# Patient Record
Sex: Female | Born: 1965
Health system: Southern US, Community
[De-identification: ages and names within clinical notes are randomized; demographics above are authoritative.]

## PROBLEM LIST (undated history)

## (undated) DIAGNOSIS — T7840XA Allergy, unspecified, initial encounter: Secondary | ICD-10-CM

## (undated) DIAGNOSIS — R112 Nausea with vomiting, unspecified: Secondary | ICD-10-CM

## (undated) DIAGNOSIS — H9193 Unspecified hearing loss, bilateral: Secondary | ICD-10-CM

## (undated) DIAGNOSIS — I10 Essential (primary) hypertension: Secondary | ICD-10-CM

## (undated) DIAGNOSIS — F32A Depression, unspecified: Secondary | ICD-10-CM

## (undated) DIAGNOSIS — T8859XA Other complications of anesthesia, initial encounter: Secondary | ICD-10-CM

## (undated) DIAGNOSIS — E559 Vitamin D deficiency, unspecified: Secondary | ICD-10-CM

## (undated) DIAGNOSIS — J45909 Unspecified asthma, uncomplicated: Secondary | ICD-10-CM

## (undated) DIAGNOSIS — K219 Gastro-esophageal reflux disease without esophagitis: Secondary | ICD-10-CM

## (undated) DIAGNOSIS — E785 Hyperlipidemia, unspecified: Secondary | ICD-10-CM

## (undated) HISTORY — DX: Gastro-esophageal reflux disease without esophagitis: K21.9

## (undated) HISTORY — DX: Unspecified asthma, uncomplicated: J45.909

## (undated) HISTORY — DX: Allergy, unspecified, initial encounter: T78.40XA

## (undated) HISTORY — DX: Vitamin D deficiency, unspecified: E55.9

## (undated) HISTORY — DX: Unspecified hearing loss, bilateral: H91.93

## (undated) HISTORY — DX: Depression, unspecified: F32.A

## (undated) HISTORY — DX: Essential (primary) hypertension: I10

## (undated) HISTORY — DX: Hyperlipidemia, unspecified: E78.5

---
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
## 1986-02-21 HISTORY — PX: FRACTURE SURGERY: SHX138

## 1989-02-21 HISTORY — PX: NASAL SEPTUM SURGERY: SHX37

## 1990-02-21 HISTORY — PX: OTHER SURGICAL HISTORY: SHX169

## 2003-02-22 DIAGNOSIS — K219 Gastro-esophageal reflux disease without esophagitis: Secondary | ICD-10-CM

## 2003-02-22 HISTORY — DX: Gastro-esophageal reflux disease without esophagitis: K21.9

## 2006-02-21 HISTORY — PX: OTHER SURGICAL HISTORY: SHX169

## 2009-02-21 HISTORY — PX: SPINE SURGERY: SHX786

## 2010-02-21 DIAGNOSIS — I10 Essential (primary) hypertension: Secondary | ICD-10-CM

## 2010-02-21 DIAGNOSIS — E785 Hyperlipidemia, unspecified: Secondary | ICD-10-CM

## 2010-02-21 HISTORY — DX: Essential (primary) hypertension: I10

## 2010-02-21 HISTORY — DX: Hyperlipidemia, unspecified: E78.5

## 2011-02-22 HISTORY — PX: OTHER SURGICAL HISTORY: SHX169

## 2013-02-21 HISTORY — PX: COLONOSCOPY: SHX174

## 2015-01-02 LAB — HM COLONOSCOPY

## 2016-11-08 ENCOUNTER — Ambulatory Visit (INDEPENDENT_AMBULATORY_CARE_PROVIDER_SITE_OTHER): Payer: PRIVATE HEALTH INSURANCE | Admitting: Internal Medicine

## 2016-11-08 ENCOUNTER — Encounter: Payer: Self-pay | Admitting: Internal Medicine

## 2016-11-08 VITALS — BP 124/84 | HR 96 | Temp 97.5°F | Resp 18 | Ht 62.0 in | Wt 157.2 lb

## 2016-11-08 DIAGNOSIS — Z0001 Encounter for general adult medical examination with abnormal findings: Secondary | ICD-10-CM | POA: Diagnosis not present

## 2016-11-08 DIAGNOSIS — Z23 Encounter for immunization: Secondary | ICD-10-CM

## 2016-11-08 DIAGNOSIS — K219 Gastro-esophageal reflux disease without esophagitis: Secondary | ICD-10-CM

## 2016-11-08 DIAGNOSIS — R5383 Other fatigue: Secondary | ICD-10-CM

## 2016-11-08 DIAGNOSIS — Z136 Encounter for screening for cardiovascular disorders: Secondary | ICD-10-CM

## 2016-11-08 DIAGNOSIS — I10 Essential (primary) hypertension: Secondary | ICD-10-CM

## 2016-11-08 DIAGNOSIS — Z1212 Encounter for screening for malignant neoplasm of rectum: Secondary | ICD-10-CM

## 2016-11-08 DIAGNOSIS — E559 Vitamin D deficiency, unspecified: Secondary | ICD-10-CM

## 2016-11-08 DIAGNOSIS — F329 Major depressive disorder, single episode, unspecified: Secondary | ICD-10-CM | POA: Insufficient documentation

## 2016-11-08 DIAGNOSIS — R7309 Other abnormal glucose: Secondary | ICD-10-CM

## 2016-11-08 DIAGNOSIS — F32A Depression, unspecified: Secondary | ICD-10-CM

## 2016-11-08 DIAGNOSIS — J45909 Unspecified asthma, uncomplicated: Secondary | ICD-10-CM

## 2016-11-08 DIAGNOSIS — Z111 Encounter for screening for respiratory tuberculosis: Secondary | ICD-10-CM | POA: Diagnosis not present

## 2016-11-08 DIAGNOSIS — F325 Major depressive disorder, single episode, in full remission: Secondary | ICD-10-CM | POA: Insufficient documentation

## 2016-11-08 DIAGNOSIS — Z79899 Other long term (current) drug therapy: Secondary | ICD-10-CM

## 2016-11-08 DIAGNOSIS — E782 Mixed hyperlipidemia: Secondary | ICD-10-CM | POA: Insufficient documentation

## 2016-11-08 DIAGNOSIS — Z1211 Encounter for screening for malignant neoplasm of colon: Secondary | ICD-10-CM

## 2016-11-08 NOTE — Progress Notes (Signed)
ADULT & ADOLESCENT INTERNAL MEDICINE Unk Pinto, M.D.     Uvaldo Bristle. [name], P.A.-C [name], [place] [address] Telephone [phone] Telefax [phone] Annual Screening/Preventative Visit & Comprehensive Evaluation &  Examination     This very nice 51 y.o. MWF returns to this practice after a 20+ yr sojurn and return to the [place] area from [place]. She  presents for a Screening/Preventative Visit & comprehensive evaluation and management of multiple medical co-morbidities.  Patient has been followed for HTN, Prediabetes, Hyperlipidemia and Vitamin D Deficiency. Patient has hx/o GERD controlled with her meds.       HTN predates since  2012. Patient's BP has been controlled at home and patient denies any cardiac symptoms as chest pain, palpitations, shortness of breath, dizziness or ankle swelling. Today's BP is at goal - 124/84.      Patient's hyperlipidemia is allegedly controlled with diet and medications. Patient denies myalgias or other medication SE's. Records of labs are forthcoming.        Patient is screened expectantly for prediabetes predating  and patient denies reactive hypoglycemic symptoms, visual blurring, diabetic polys, or paresthesias.     Finally, patient has history of Vitamin D Deficiency and lis on replacement therapy.   Allergies - none  Past Medical History:  Diagnosis Date  . Allergy   . Asthma   . GERD (gastroesophageal reflux disease) 2005  . 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 Hyperlipidemia 2012  . Hypertension 2012  . Vitamin D deficiency    Immunization History  Administered Date(s) Administered  . Influenza Inj Mdck Quad With Preservative 11/08/2016  . PPD Test 11/08/2016  . Pneumococcal-Unspecified 02/22/2015  . Td 02/22/2012  . Zoster 02/22/2015   Past Surgical History:  Procedure Laterality Date  . COLONOSCOPY N/A 2015   screening  at age 13 - Negative  . FRACTURE SURGERY  1988   facial Fx , nose, Ankle both Arms  . l ulnar nonunion  1992  . NASAL SEPTUM SURGERY  1991  . nonunion 5th metatarsal  2008  . r sub talar fusion  2008  . redo subtalar fusion  2013  . rt ankle surg for non union  2013  . SPINE SURGERY  2011   Cx5 - Cx6 fusion w/plate/screws  . TONSILLECTOMY AND ADENOIDECTOMY  1972   Family History  Problem Relation Age of Onset  . Diabetes Mother   . Heart disease Father   . Hypertension Brother   . Heart disease Brother   . Stroke Maternal Grandmother      Social History  Substance Use Topics  . Smoking status: Never Smoker  . Smokeless tobacco: Not on file  . Alcohol use No    ROS Constitutional: Denies fever, chills, weight loss/gain, headaches, insomnia,  night sweats, and change in appetite. Does c/o fatigue. Eyes: Denies redness, blurred vision, diplopia, discharge, itchy, watery eyes.  ENT: Denies discharge, congestion, post nasal drip, epistaxis, sore throat, earache, hearing loss, dental pain, Tinnitus, Vertigo, Sinus pain, snoring.  Cardio: Denies chest pain, palpitations, irregular heartbeat, syncope, dyspnea, diaphoresis, orthopnea, PND, claudication, edema Respiratory: denies cough, dyspnea, DOE, pleurisy, hoarseness, laryngitis, wheezing.  Gastrointestinal: Denies dysphagia, heartburn, reflux, water brash, pain, cramps, nausea, vomiting, bloating, diarrhea, constipation, hematemesis, melena, hematochezia, jaundice, hemorrhoids Genitourinary: Denies dysuria, frequency, urgency, nocturia, hesitancy, discharge, hematuria, flank pain Breast: Breast lumps, nipple discharge, bleeding.  Musculoskeletal: Denies arthralgia, myalgia, stiffness, Jt. Swelling, pain, limp,  and strain/sprain. Denies falls. Skin: Denies puritis, rash, hives, warts, acne, eczema, changing in skin lesion Neuro: No weakness, tremor, incoordination, spasms, paresthesia, pain Psychiatric: Denies confusion, memory  loss, sensory loss. Denies Depression. Endocrine: Denies change in weight, skin, hair change, nocturia, and paresthesia, diabetic polys, visual blurring, hyper / hypo glycemic episodes.  Heme/Lymph: No excessive bleeding, bruising, enlarged lymph nodes.  Physical Exam  BP 124/84   Pulse 96   Temp (!) 97.5 F (36.4 C)   Resp 18   Ht 5\' 2"  (1.575 m)   Wt 157 lb 3.2 oz (71.3 kg)   BMI 28.75 kg/m   General Appearance: Well nourished, well groomed and in no apparent distress.  Eyes: PERRLA, EOMs, conjunctiva no swelling or erythema, normal fundi and vessels. Sinuses: No frontal/maxillary tenderness ENT/Mouth: EACs patent / TMs  nl. Nares clear without erythema, swelling, mucoid exudates. Oral hygiene is good. No erythema, swelling, or exudate. Tongue normal, non-obstructing. Tonsils not swollen or erythematous. Hearing normal.  Neck: Supple, thyroid normal. No bruits, nodes or JVD. Respiratory: Respiratory effort normal.  BS equal and clear bilateral without rales, rhonci, wheezing or stridor. Cardio: Heart sounds are normal with regular rate and rhythm and no murmurs, rubs or gallops. Peripheral pulses are normal and equal bilaterally without edema. No aortic or femoral bruits. Chest: symmetric with normal excursions and percussion. Breasts: Deferred to GYN.  Abdomen: Flat, soft with bowel sounds active. Nontender, no guarding, rebound, hernias, masses, or organomegaly.  Lymphatics: Non tender without lymphadenopathy.  Genitourinary: Deferred to GYN. Musculoskeletal: Full ROM all peripheral extremities, joint stability, 5/5 strength, and normal gait. Skin: Warm and dry without rashes, lesions, cyanosis, clubbing or  ecchymosis.  Neuro: Cranial nerves intact, reflexes equal bilaterally. Normal muscle tone, no cerebellar symptoms. Sensation intact.  Pysch: Alert and oriented X 3, normal affect, Insight and Judgment appropriate.   Assessment and Plan  1. Annual Preventative Screening  Examination  2. Essential hypertension  - lisinopril (PRINIVIL,ZESTRIL) 20 MG tablet; TK 1 T PO ONCE D; Refill: 1  - EKG 12-Lead - [place], RETROPERITNL ABD,  LTD - Urinalysis, Routine w reflex microscopic - Microalbumin / creatinine urine ratio - CBC with Differential/Platelet - BASIC METABOLIC PANEL WITH GFR - Magnesium - TSH  3. Hyperlipidemia, mixed  - atorvastatin  20 MG tablet; Take 20 mg by mouth at bedtime.; Refill: 1 - Omega-3 Fatty Acids (OMEGA-3 CF PO); Take 1 capsule by mouth daily.  - EKG 12-Lead  - [place], RETROPERITNL ABD,  LTD  - Hepatic function panel - Lipid panel  4. Abnormal glucose  - Hemoglobin A1c - Insulin, fasting  5. Vitamin D deficiency  - Vitamin D, Ergocalciferol, (DRISDOL) 50000 units CAPS capsule; TK ONE C PO ONCE A WEEK; Refill: 1 - VITAMIN D 25 Hydroxy   6. Gastroesophageal reflux disease  - pantoprazole  40 MG tablet; TK 1 T PO D PRN; Refill: 1  7. Asthma due to environmental allergies  - PROAIR HFA 108 (90 Base) MCG/ACT inhaler; INHALE 1 PUFF PO Q 4 H; Refill: 1 - benzonatate (TESSALON) 100 MG capsule; TK ONE C PO TID PRN; Refill: 1 - XYZAL ALLERGY 24HR 5 MG tablet; TK 1 T PO ONCE A DAY IN THE EVE; Refill: 1 - DULERA 100-5 MCG/ACT AERO; INL 2 PFS PO BID; Refill: 1 - montelukast (SINGULAIR) 10 MG tablet; TK 1 T PO ONCE A DAY; Refill: 1  8. Depression, controlled  - ALPRAZolam (XANAX) 0.5 MG tablet; ; Refill: 1 - buPROPion (WELLBUTRIN XL)  150 MG 24 hr tablet; TK 1 T PO QAM; Refill: 1 - citalopram (CELEXA) 20 MG tablet; TK 1 T PO ONCE A DAY; Refill: 1 - clonazePAM (KLONOPIN) 1 MG tablet; TK 1 T PO ONCE D; Refill: 0  9. Screening for colorectal cancer  - POC Hemoccult Bld/Stl   10. Screening for AAA (aortic abdominal aneurysm)  - [place], RETROPERITNL ABD,  LTD  11. Screening for ischemic heart disease  - EKG 12-Lead  12. Screening examination for pulmonary tuberculosis  - PPD  13. Need for prophylactic vaccination and  inoculation against influenza  - FLU VACCINE MDCK QUAD W/Preservative  14. Fatigue  - Vitamin B12 - CBC with Differential/Platelet - Iron,Total/Total Iron Binding Cap  15. Medication management  - Urinalysis, Routine w reflex microscopic - Microalbumin / creatinine urine ratio - CBC with Differential/Platelet - BASIC METABOLIC PANEL WITH GFR - Hepatic function panel - Magnesium - Lipid panel - TSH - Hemoglobin A1c - Insulin, fasting - VITAMIN D 25 Hydroxy        Patient was counseled in prudent diet to achieve/maintain BMI less than 25 for weight control, BP monitoring, regular exercise and medications. Discussed med's effects and SE's. Screening labs and tests as requested with regular follow-up as recommended. Over 55 minutes of exam, counseling, chart review and high complex critical decision making was performed.

## 2016-11-08 NOTE — Patient Instructions (Signed)

## 2016-11-09 LAB — CBC WITH DIFFERENTIAL/PLATELET
BASOS PCT: 0.6 %
Basophils Absolute: 32 cells/uL (ref 0–200)
EOS ABS: 32 {cells}/uL (ref 15–500)
Eosinophils Relative: 0.6 %
HCT: 36.6 % (ref 35.0–45.0)
Hemoglobin: 12.5 g/dL (ref 11.7–15.5)
Lymphs Abs: 970 cells/uL (ref 850–3900)
MCH: 30.9 pg (ref 27.0–33.0)
MCHC: 34.2 g/dL (ref 32.0–36.0)
MCV: 90.6 fL (ref 80.0–100.0)
MPV: 11 fL (ref 7.5–12.5)
Monocytes Relative: 6.6 %
NEUTROS PCT: 73.9 %
Neutro Abs: 3917 cells/uL (ref 1500–7800)
PLATELETS: 207 10*3/uL (ref 140–400)
RBC: 4.04 10*6/uL (ref 3.80–5.10)
RDW: 11.9 % (ref 11.0–15.0)
TOTAL LYMPHOCYTE: 18.3 %
WBC: 5.3 10*3/uL (ref 3.8–10.8)
WBCMIX: 350 {cells}/uL (ref 200–950)

## 2016-11-09 LAB — IRON, TOTAL/TOTAL IRON BINDING CAP
%SAT: 9 % (calc) — ABNORMAL LOW (ref 11–50)
IRON: 27 ug/dL — AB (ref 45–160)
TIBC: 286 mcg/dL (calc) (ref 250–450)

## 2016-11-09 LAB — URINALYSIS, ROUTINE W REFLEX MICROSCOPIC
Bilirubin Urine: NEGATIVE
Glucose, UA: NEGATIVE
HGB URINE DIPSTICK: NEGATIVE
Hyaline Cast: NONE SEEN /LPF
Ketones, ur: NEGATIVE
Nitrite: NEGATIVE
Protein, ur: NEGATIVE
RBC / HPF: NONE SEEN /HPF (ref 0–2)
SQUAMOUS EPITHELIAL / LPF: NONE SEEN /HPF (ref <=5)
Specific Gravity, Urine: 1.007 (ref 1.001–1.03)
pH: 6 (ref 5.0–8.0)

## 2016-11-09 LAB — LIPID PANEL
CHOL/HDL RATIO: 2.7 (calc) (ref <5.0)
CHOLESTEROL: 125 mg/dL (ref <200)
HDL: 47 mg/dL — AB (ref >50)
LDL CHOLESTEROL (CALC): 56 mg/dL
Non-HDL Cholesterol (Calc): 78 mg/dL (calc) (ref <130)
TRIGLYCERIDES: 136 mg/dL (ref <150)

## 2016-11-09 LAB — HEMOGLOBIN A1C
HEMOGLOBIN A1C: 4.8 %{Hb} (ref <5.7)
MEAN PLASMA GLUCOSE: 91 (calc)
eAG (mmol/L): 5 (calc)

## 2016-11-09 LAB — MICROALBUMIN / CREATININE URINE RATIO
CREATININE, URINE: 54 mg/dL (ref 20–275)
MICROALB UR: 0.3 mg/dL
Microalb Creat Ratio: 6 mcg/mg creat (ref <30)

## 2016-11-09 LAB — HEPATIC FUNCTION PANEL
AG RATIO: 1.9 (calc) (ref 1.0–2.5)
ALBUMIN MSPROF: 4.4 g/dL (ref 3.6–5.1)
ALT: 13 U/L (ref 6–29)
AST: 15 U/L (ref 10–35)
Alkaline phosphatase (APISO): 115 U/L (ref 33–130)
BILIRUBIN INDIRECT: 0.2 mg/dL (ref 0.2–1.2)
Bilirubin, Direct: 0.1 mg/dL (ref 0.0–0.2)
GLOBULIN: 2.3 g/dL (ref 1.9–3.7)
TOTAL PROTEIN: 6.7 g/dL (ref 6.1–8.1)
Total Bilirubin: 0.3 mg/dL (ref 0.2–1.2)

## 2016-11-09 LAB — INSULIN, FASTING: Insulin: 4 u[IU]/mL (ref 2.0–19.6)

## 2016-11-09 LAB — BASIC METABOLIC PANEL WITH GFR
BUN: 13 mg/dL (ref 7–25)
CALCIUM: 8.8 mg/dL (ref 8.6–10.4)
CHLORIDE: 101 mmol/L (ref 98–110)
CO2: 27 mmol/L (ref 20–32)
CREATININE: 0.92 mg/dL (ref 0.50–1.05)
GFR, Est African American: 82 mL/min/{1.73_m2} (ref >=60)
GFR, Est Non African American: 71 mL/min/{1.73_m2} (ref >=60)
GLUCOSE: 82 mg/dL (ref 65–99)
Potassium: 3.6 mmol/L (ref 3.5–5.3)
Sodium: 139 mmol/L (ref 135–146)

## 2016-11-09 LAB — VITAMIN B12: Vitamin B-12: 292 pg/mL (ref 200–1100)

## 2016-11-09 LAB — MAGNESIUM: MAGNESIUM: 1.9 mg/dL (ref 1.5–2.5)

## 2016-11-09 LAB — TSH: TSH: 2.23 mIU/L

## 2016-11-09 LAB — VITAMIN D 25 HYDROXY (VIT D DEFICIENCY, FRACTURES): Vit D, 25-Hydroxy: 55 ng/mL (ref 30–100)

## 2016-11-10 LAB — TB SKIN TEST
INDURATION: 0 mm
TB Skin Test: NEGATIVE

## 2016-11-14 ENCOUNTER — Other Ambulatory Visit: Payer: Self-pay | Admitting: *Deleted

## 2016-11-14 ENCOUNTER — Other Ambulatory Visit: Payer: Self-pay | Admitting: Internal Medicine

## 2016-11-14 DIAGNOSIS — F329 Major depressive disorder, single episode, unspecified: Secondary | ICD-10-CM

## 2016-11-14 DIAGNOSIS — I10 Essential (primary) hypertension: Secondary | ICD-10-CM

## 2016-11-14 DIAGNOSIS — F32A Depression, unspecified: Secondary | ICD-10-CM

## 2016-11-14 DIAGNOSIS — J45909 Unspecified asthma, uncomplicated: Secondary | ICD-10-CM

## 2016-11-14 MED ORDER — PROAIR HFA 108 (90 BASE) MCG/ACT IN AERS: INHALATION_SPRAY | RESPIRATORY_TRACT | 1 refills | Status: DC

## 2016-11-14 MED ORDER — LISINOPRIL 20 MG PO TABS: ORAL_TABLET | ORAL | 0 refills | Status: DC

## 2016-11-15 ENCOUNTER — Other Ambulatory Visit: Payer: Self-pay | Admitting: *Deleted

## 2016-11-15 DIAGNOSIS — Z79899 Other long term (current) drug therapy: Secondary | ICD-10-CM

## 2016-11-15 DIAGNOSIS — F32A Depression, unspecified: Secondary | ICD-10-CM

## 2016-11-15 DIAGNOSIS — F329 Major depressive disorder, single episode, unspecified: Secondary | ICD-10-CM

## 2016-11-15 MED ORDER — ALPRAZOLAM 0.5 MG PO TABS: ORAL_TABLET | ORAL | 2 refills | Status: DC

## 2016-11-15 MED ORDER — CYCLOBENZAPRINE HCL 10 MG PO TABS: ORAL_TABLET | ORAL | 0 refills | Status: DC

## 2016-12-11 ENCOUNTER — Other Ambulatory Visit: Payer: Self-pay | Admitting: Internal Medicine

## 2017-01-08 ENCOUNTER — Other Ambulatory Visit: Payer: Self-pay | Admitting: Physician Assistant

## 2017-01-25 ENCOUNTER — Other Ambulatory Visit: Payer: Self-pay | Admitting: Internal Medicine

## 2017-01-25 DIAGNOSIS — F32A Depression, unspecified: Secondary | ICD-10-CM

## 2017-01-25 DIAGNOSIS — E782 Mixed hyperlipidemia: Secondary | ICD-10-CM

## 2017-01-25 DIAGNOSIS — F329 Major depressive disorder, single episode, unspecified: Secondary | ICD-10-CM

## 2017-01-25 DIAGNOSIS — I10 Essential (primary) hypertension: Secondary | ICD-10-CM

## 2017-01-25 DIAGNOSIS — K219 Gastro-esophageal reflux disease without esophagitis: Secondary | ICD-10-CM

## 2017-01-25 DIAGNOSIS — Z79899 Other long term (current) drug therapy: Secondary | ICD-10-CM

## 2017-02-13 ENCOUNTER — Other Ambulatory Visit: Payer: Self-pay | Admitting: Internal Medicine

## 2017-02-13 DIAGNOSIS — E669 Obesity, unspecified: Secondary | ICD-10-CM | POA: Insufficient documentation

## 2017-02-13 DIAGNOSIS — E663 Overweight: Secondary | ICD-10-CM | POA: Insufficient documentation

## 2017-02-13 NOTE — Progress Notes (Signed)
FOLLOW UP  Assessment and Plan:   Hypertension Well controlled with current medications  Monitor blood pressure at home; patient to call if consistently greater than 130/80 Continue DASH diet.   Reminder to go to the ER if any CP, SOB, nausea, dizziness, severe HA, changes vision/speech, left arm numbness and tingling and jaw pain.  Cholesterol At goal on statin; continue medication  Continue low cholesterol diet and exercise.  Check lipid panel.   Other abnormal glucose Discussed risks associated with elevated glucose Recommend prudent diet, portion control, maintenance of weight in normal range, regular aerobic exercise Defer A1C; check annually - monitor sugars by BMP  GERD Symptoms well managed without breakthrough Will try to get off PPI given info for taper and zantac sent in Discussed diet, avoiding triggers and other lifestyle changes  Overweight Long discussion about weight loss, diet, and exercise Recommended diet heavy in fruits and veggies and low in animal meats, cheeses, and dairy products, appropriate calorie intake Discussed appropriate weight for height Follow up in 3 months  Depression/anxiety Continuing to need xanax nearly every day; will try increasing wellbutrin  Lifestyle discussed: diet/exerise, sleep hygiene, stress management, hydration  Vitamin D Def/ osteoporosis prevention Continue supplementation Check Vit D level  Continue diet and meds as discussed. Further disposition pending results of labs. Discussed med's effects and SE's.   Over 30 minutes of exam, counseling, chart review, and critical decision making was performed.   Future Appointments  Date Time Provider [place]  05/18/2017  9:30 AM Unk Pinto, MD [facility] None    ----------------------------------------------------------------------------------------------------------------------  HPI 51 y.o. female  presents for 3 month follow up on hypertension,  cholesterol, GERD, blood sugar monitoring, weight, depression and vitamin D deficiency.   BMI is Body mass index is 29.81 kg/m., she has been working on diet and exercise. Wt Readings from Last 3 Encounters:  02/15/17 163 lb (73.9 kg)  11/08/16 157 lb 3.2 oz (71.3 kg)   she has ongoing depression with anxious features and is currently on celexa 20 mg daily, wellbutrin XR 150 mg daily, xanax 0.5 mg PRN (currently taking most every day), reports symptoms are not well controlled on current regimen - still needing xanax daily.    she has a diagnosis of GERD which is currently managed by protonix 40 mg daily.  she reports symptoms is currently well controlled, and denies breakthrough reflux, burning in chest, hoarseness or cough.    Her blood pressure has been controlled at home, today their BP is BP: 110/70  She does workout. She denies chest pain, shortness of breath, dizziness.   She is on cholesterol medication and denies myalgias. Her cholesterol is at goal. The cholesterol last visit was:   Lab Results  Component Value Date   CHOL 125 11/08/2016   HDL 47 (L) 11/08/2016   TRIG 136 11/08/2016   CHOLHDL 2.7 11/08/2016    She has been working on diet and exercise for blood sugar control, and denies increased appetite, nausea, paresthesia of the feet, polydipsia, polyuria, visual disturbances and vomiting. Last A1C in the office was:  Lab Results  Component Value Date   HGBA1C 4.8 11/08/2016   Patient is on Vitamin D supplement but remained below goal at the last check:   Lab Results  Component Value Date   VD25OH 55 11/08/2016      Current Medications:  Current Outpatient Medications on File Prior to Visit  Medication Sig  . ALPRAZolam (XANAX) 0.5 MG tablet Take 1/2 to 1  tablet / daily if needed for anxiety and please try to limit to 5 days /week to avoid addiction  . atorvastatin (LIPITOR) 20 MG tablet TAKE 1 TABLET BY MOUTH ONCE A DAY  . benzonatate (TESSALON) 100 MG capsule TK  ONE C PO TID PRN  . buPROPion (WELLBUTRIN XL) 150 MG 24 hr tablet TAKE 1 TABLET BY MOUTH EVERY MORNING  . calcium citrate (CALCITRATE - DOSED IN MG ELEMENTAL CALCIUM) 950 MG tablet Take 200 mg of elemental calcium by mouth daily.  . citalopram (CELEXA) 20 MG tablet TAKE ONE TABLET BY MOUTH DAILY.  Marland Kitchen CRANBERRY EXTRACT PO Take 1 capsule by mouth daily.  . cyclobenzaprine (FLEXERIL) 10 MG tablet TAKE 1/2 TO 1 TABLET BY MOUTH THREE TIMES DAILY AS NEEDED  . DULERA 100-5 MCG/ACT AERO INL 2 PFS PO BID  . ibuprofen (ADVIL,MOTRIN) 600 MG tablet Take 600 mg by mouth every 6 (six) hours as needed.  Marland Kitchen lisinopril (PRINIVIL,ZESTRIL) 20 MG tablet TAKE 1 TABLET BY MOUTH EVERY DAY  . montelukast (SINGULAIR) 10 MG tablet TK 1 T PO ONCE A DAY  . Omega-3 Fatty Acids (OMEGA-3 CF PO) Take 1 capsule by mouth daily.  . pantoprazole (PROTONIX) 40 MG tablet TAKE 1 TABLET BY MOUTH TWICE DAILY  . Prenatal Vit-Fe Fumarate-FA (M-VIT PO) Take 1 tablet by mouth daily.  Marland Kitchen PROAIR HFA 108 (90 Base) MCG/ACT inhaler INHALE 1 PUFF PO Q 4 H  . vitamin B-12 (CYANOCOBALAMIN) 100 MCG tablet Take 100 mcg by mouth 2 (two) times daily.  . Vitamin D, Ergocalciferol, (DRISDOL) 50000 units CAPS capsule TK ONE C PO ONCE A WEEK  . XYZAL ALLERGY 24HR 5 MG tablet TK 1 T PO ONCE A DAY IN THE EVE   No current facility-administered medications on file prior to visit.      Allergies: No Known Allergies   Medical History:  Past Medical History:  Diagnosis Date  . Allergy   . Asthma   . GERD (gastroesophageal reflux disease) 2005  . 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 Hyperlipidemia 2012  . Hypertension 2012  . Vitamin D deficiency    Family history- Reviewed and unchanged Social history- Reviewed and unchanged   Review of Systems:  Review of Systems  Constitutional: Negative for malaise/fatigue and weight loss.  HENT: Negative for hearing loss and tinnitus.   Eyes: Negative for blurred vision and double vision.   Respiratory: Negative for cough, shortness of breath and wheezing.   Cardiovascular: Negative for chest pain, palpitations, orthopnea, claudication and leg swelling.  Gastrointestinal: Negative for abdominal pain, blood in stool, constipation, diarrhea, heartburn, melena, nausea and vomiting.  Genitourinary: Negative.   Musculoskeletal: Negative for joint pain and myalgias.  Skin: Negative for rash.  Neurological: Negative for dizziness, tingling, sensory change, weakness and headaches.  Endo/Heme/Allergies: Negative for polydipsia.  Psychiatric/Behavioral: Negative for depression and substance abuse. The patient is nervous/anxious.   All other systems reviewed and are negative.     Physical Exam: BP 110/70   Pulse (!) 107   Temp 97.6 F (36.4 C)   Wt 163 lb (73.9 kg)   SpO2 98%   BMI 29.81 kg/m  Wt Readings from Last 3 Encounters:  02/15/17 163 lb (73.9 kg)  11/08/16 157 lb 3.2 oz (71.3 kg)   General Appearance: Well nourished, in no apparent distress. Eyes: PERRLA, EOMs, conjunctiva no swelling or erythema Sinuses: No Frontal/maxillary tenderness ENT/Mouth: Ext aud canals clear, TMs without erythema, bulging. No erythema, swelling, or  exudate on post pharynx.  Tonsils not swollen or erythematous. Hearing normal.  Neck: Supple, thyroid normal.  Respiratory: Respiratory effort normal, BS equal bilaterally without rales, rhonchi, wheezing or stridor.  Cardio: RRR with no MRGs. Brisk peripheral pulses without edema.  Abdomen: Soft, + BS.  Non tender, no guarding, rebound, hernias, masses. Lymphatics: Non tender without lymphadenopathy.  Musculoskeletal: Full ROM, 5/5 strength, Normal gait Skin: Warm, dry without rashes, lesions, ecchymosis.  Neuro: Cranial nerves intact. No cerebellar symptoms.  Psych: Awake and oriented X 3, normal affect, Insight and Judgment appropriate.    Erin Ribas, NP 9:02 AM [facility]

## 2017-02-15 ENCOUNTER — Ambulatory Visit: Payer: PRIVATE HEALTH INSURANCE | Admitting: Adult Health

## 2017-02-15 ENCOUNTER — Encounter: Payer: Self-pay | Admitting: Adult Health

## 2017-02-15 VITALS — BP 110/70 | HR 107 | Temp 97.6°F | Wt 163.0 lb

## 2017-02-15 DIAGNOSIS — I10 Essential (primary) hypertension: Secondary | ICD-10-CM | POA: Diagnosis not present

## 2017-02-15 DIAGNOSIS — E663 Overweight: Secondary | ICD-10-CM

## 2017-02-15 DIAGNOSIS — R7309 Other abnormal glucose: Secondary | ICD-10-CM | POA: Diagnosis not present

## 2017-02-15 DIAGNOSIS — F329 Major depressive disorder, single episode, unspecified: Secondary | ICD-10-CM | POA: Diagnosis not present

## 2017-02-15 DIAGNOSIS — E782 Mixed hyperlipidemia: Secondary | ICD-10-CM | POA: Diagnosis not present

## 2017-02-15 DIAGNOSIS — K219 Gastro-esophageal reflux disease without esophagitis: Secondary | ICD-10-CM

## 2017-02-15 DIAGNOSIS — E559 Vitamin D deficiency, unspecified: Secondary | ICD-10-CM

## 2017-02-15 DIAGNOSIS — F32A Depression, unspecified: Secondary | ICD-10-CM

## 2017-02-15 DIAGNOSIS — Z79899 Other long term (current) drug therapy: Secondary | ICD-10-CM | POA: Diagnosis not present

## 2017-02-15 MED ORDER — BUPROPION HCL ER (XL) 300 MG PO TB24: 300.0000 mg | ORAL_TABLET | Freq: Every morning | ORAL | 1 refills | Status: DC

## 2017-02-15 MED ORDER — RANITIDINE HCL 150 MG PO TABS: 150.0000 mg | ORAL_TABLET | Freq: Two times a day (BID) | ORAL | 1 refills | Status: DC

## 2017-02-15 MED ORDER — CYCLOBENZAPRINE HCL 10 MG PO TABS: 5.0000 mg | ORAL_TABLET | Freq: Three times a day (TID) | ORAL | 1 refills | Status: DC | PRN

## 2017-02-15 NOTE — Patient Instructions (Signed)
We are trying a higher dose of wellbutrin - double up on 150 mg tabs until you run out - I have sent in new prescription with 300 mg tabs   GETTING OFF OF PPI's    Nexium/protonix/prilosec/Omeprazole/Dexilant/Aciphex are called PPI's, they are great at healing your stomach but should only be taken for a short period of time.     Recent studies have shown that taken for a long time they  can increase the risk of osteoporosis (weakening of your bones), pneumonia, low magnesium, restless legs, Cdiff (infection that causes diarrhea), DEMENTIA and most recently kidney damage / disease / insufficiency.     Due to this information we want to try to stop the PPI but if you try to stop it abruptly this can cause rebound acid and worsening symptoms.   So this is how we want you to get off the PPI: Generic is always fine!!  - Start taking the nexium/protonix/prilosec/PPI  every other day with  zantac (ranitidine) OR pepcid famotadine 2 x a day for 2-4 weeks - some people stay on this dosage and can not taper off further. Our main goal is to limit the dosage and amount you are taking so if you need to stay on this dose.   - then decrease the PPI to every 3 days while taking the zantac or pepcid 300mg  twice a day the other  days for 2-4  Weeks  - then you can try the zantac or pepcid 300mg  once at night or up to 2 x day as needed.  - you can continue on this once at night or stop all together  - Avoid alcohol, spicy foods, NSAIDS (aleve, ibuprofen) at this time. See foods below.   +++++++++++++++++++++++++++++++++++++++++++  Food Choices for Gastroesophageal Reflux Disease  When you have gastroesophageal reflux disease (GERD), the foods you eat and your eating habits are very important. Choosing the right foods can help ease the discomfort of GERD. WHAT GENERAL GUIDELINES DO I NEED TO FOLLOW?  Choose fruits, vegetables, whole grains, low-fat dairy products, and low-fat meat, fish, and  poultry.  Limit fats such as oils, salad dressings, butter, nuts, and avocado.  Keep a food diary to identify foods that cause symptoms.  Avoid foods that cause reflux. These may be different for different people.  Eat frequent small meals instead of three large meals each day.  Eat your meals slowly, in a relaxed setting.  Limit fried foods.  Cook foods using methods other than frying.  Avoid drinking alcohol.  Avoid drinking large amounts of liquids with your meals.  Avoid bending over or lying down until 2-3 hours after eating.   WHAT FOODS ARE NOT RECOMMENDED? The following are some foods and drinks that may worsen your symptoms:  Vegetables Tomatoes. Tomato juice. Tomato and spaghetti sauce. Chili peppers. Onion and garlic. Horseradish. Fruits Oranges, grapefruit, and lemon (fruit and juice). Meats High-fat meats, fish, and poultry. This includes hot dogs, ribs, ham, sausage, salami, and bacon. Dairy Whole milk and chocolate milk. Sour cream. Cream. Butter. Ice cream. Cream cheese.  Beverages Coffee and tea, with or without caffeine. Carbonated beverages or energy drinks. Condiments Hot sauce. Barbecue sauce.  Sweets/Desserts Chocolate and cocoa. Donuts. Peppermint and spearmint. Fats and Oils High-fat foods, including [place] fries and potato chips. Other Vinegar. Strong spices, such as black pepper, white pepper, red pepper, cayenne, curry powder, cloves, ginger, and chili powder.

## 2017-02-16 LAB — CBC WITH DIFFERENTIAL/PLATELET
BASOS ABS: 68 {cells}/uL (ref 0–200)
BASOS PCT: 1.3 %
EOS ABS: 151 {cells}/uL (ref 15–500)
Eosinophils Relative: 2.9 %
HCT: 38.6 % (ref 35.0–45.0)
Hemoglobin: 13 g/dL (ref 11.7–15.5)
Lymphs Abs: 1310 cells/uL (ref 850–3900)
MCH: 30.3 pg (ref 27.0–33.0)
MCHC: 33.7 g/dL (ref 32.0–36.0)
MCV: 90 fL (ref 80.0–100.0)
MPV: 10.8 fL (ref 7.5–12.5)
Monocytes Relative: 7.9 %
NEUTROS PCT: 62.7 %
Neutro Abs: 3260 cells/uL (ref 1500–7800)
PLATELETS: 246 10*3/uL (ref 140–400)
RBC: 4.29 10*6/uL (ref 3.80–5.10)
RDW: 13.1 % (ref 11.0–15.0)
TOTAL LYMPHOCYTE: 25.2 %
WBC: 5.2 10*3/uL (ref 3.8–10.8)
WBCMIX: 411 {cells}/uL (ref 200–950)

## 2017-02-16 LAB — BASIC METABOLIC PANEL WITH GFR
BUN: 13 mg/dL (ref 7–25)
CO2: 32 mmol/L (ref 20–32)
Calcium: 9.5 mg/dL (ref 8.6–10.4)
Chloride: 101 mmol/L (ref 98–110)
Creat: 0.85 mg/dL (ref 0.50–1.05)
GFR, Est African American: 91 mL/min/{1.73_m2} (ref >=60)
GFR, Est Non African American: 78 mL/min/{1.73_m2} (ref >=60)
GLUCOSE: 86 mg/dL (ref 65–99)
POTASSIUM: 4.3 mmol/L (ref 3.5–5.3)
SODIUM: 139 mmol/L (ref 135–146)

## 2017-02-16 LAB — HEPATIC FUNCTION PANEL
AG Ratio: 1.9 (calc) (ref 1.0–2.5)
ALKALINE PHOSPHATASE (APISO): 133 U/L — AB (ref 33–130)
ALT: 14 U/L (ref 6–29)
AST: 17 U/L (ref 10–35)
Albumin: 4.5 g/dL (ref 3.6–5.1)
BILIRUBIN TOTAL: 0.5 mg/dL (ref 0.2–1.2)
Bilirubin, Direct: 0.1 mg/dL (ref 0.0–0.2)
Globulin: 2.4 g/dL (calc) (ref 1.9–3.7)
Indirect Bilirubin: 0.4 mg/dL (calc) (ref 0.2–1.2)
TOTAL PROTEIN: 6.9 g/dL (ref 6.1–8.1)

## 2017-02-16 LAB — LIPID PANEL
Cholesterol: 157 mg/dL (ref <200)
HDL: 55 mg/dL (ref >50)
LDL Cholesterol (Calc): 82 mg/dL (calc)
Non-HDL Cholesterol (Calc): 102 mg/dL (calc) (ref <130)
TRIGLYCERIDES: 108 mg/dL (ref <150)
Total CHOL/HDL Ratio: 2.9 (calc) (ref <5.0)

## 2017-02-16 LAB — MAGNESIUM: Magnesium: 2.2 mg/dL (ref 1.5–2.5)

## 2017-02-16 LAB — TSH: TSH: 1.41 m[IU]/L

## 2017-02-16 LAB — VITAMIN D 25 HYDROXY (VIT D DEFICIENCY, FRACTURES): Vit D, 25-Hydroxy: 53 ng/mL (ref 30–100)

## 2017-02-20 ENCOUNTER — Encounter (HOSPITAL_BASED_OUTPATIENT_CLINIC_OR_DEPARTMENT_OTHER): Payer: Self-pay | Admitting: *Deleted

## 2017-02-20 ENCOUNTER — Emergency Department (HOSPITAL_BASED_OUTPATIENT_CLINIC_OR_DEPARTMENT_OTHER)
Admission: EM | Admit: 2017-02-20 | Discharge: 2017-02-20 | Disposition: A | Discharge status: Home / Self Care | Payer: 59 | Attending: Emergency Medicine | Admitting: Emergency Medicine

## 2017-02-20 ENCOUNTER — Emergency Department (HOSPITAL_BASED_OUTPATIENT_CLINIC_OR_DEPARTMENT_OTHER): Payer: 59

## 2017-02-20 ENCOUNTER — Other Ambulatory Visit: Payer: Self-pay

## 2017-02-20 DIAGNOSIS — Y9301 Activity, walking, marching and hiking: Secondary | ICD-10-CM | POA: Insufficient documentation

## 2017-02-20 DIAGNOSIS — W19XXXA Unspecified fall, initial encounter: Secondary | ICD-10-CM

## 2017-02-20 DIAGNOSIS — Y929 Unspecified place or not applicable: Secondary | ICD-10-CM | POA: Diagnosis not present

## 2017-02-20 DIAGNOSIS — J45909 Unspecified asthma, uncomplicated: Secondary | ICD-10-CM | POA: Insufficient documentation

## 2017-02-20 DIAGNOSIS — M25561 Pain in right knee: Secondary | ICD-10-CM

## 2017-02-20 DIAGNOSIS — I10 Essential (primary) hypertension: Secondary | ICD-10-CM | POA: Insufficient documentation

## 2017-02-20 DIAGNOSIS — W0110XA Fall on same level from slipping, tripping and stumbling with subsequent striking against unspecified object, initial encounter: Secondary | ICD-10-CM | POA: Diagnosis not present

## 2017-02-20 DIAGNOSIS — S99912A Unspecified injury of left ankle, initial encounter: Secondary | ICD-10-CM | POA: Diagnosis not present

## 2017-02-20 DIAGNOSIS — Z79899 Other long term (current) drug therapy: Secondary | ICD-10-CM | POA: Insufficient documentation

## 2017-02-20 DIAGNOSIS — Y999 Unspecified external cause status: Secondary | ICD-10-CM | POA: Insufficient documentation

## 2017-02-20 DIAGNOSIS — S93402A Sprain of unspecified ligament of left ankle, initial encounter: Secondary | ICD-10-CM

## 2017-02-20 DIAGNOSIS — Z7982 Long term (current) use of aspirin: Secondary | ICD-10-CM | POA: Diagnosis not present

## 2017-02-20 MED ORDER — HYDROCODONE-ACETAMINOPHEN 5-325 MG PO TABS
1.0000 | ORAL_TABLET | Freq: Once | ORAL | Status: AC
  Administered 2017-02-20: 1 via ORAL
  Filled 2017-02-20: qty 1

## 2017-02-20 NOTE — ED Notes (Signed)
Pt educated about not driving or performing other critical tasks (such as operating heavy machinery, caring for infant/toddler/child) due to sedative nature of medications received in ED. Also warned about risks of consuming alcohol or taking other medications with sedative properties. Pt/caregiver verbalized understanding.

## 2017-02-20 NOTE — ED Notes (Signed)
Patient transported to X-ray 

## 2017-02-20 NOTE — Discharge Instructions (Signed)
X-ray of your left foot, right knee were reassuring.  No fractures or dislocations.  The CT scan of your face does not show any new fractures or abnormalities.  Please take Tylenol as needed for pain.  Rest and elevate the left foot when you can.  Please also ice the left ankle at least twice a day for the next several days for 15 minutes at a time.  Follow up with your primary doctor if you continue to have pain in a week.   Return to the emergency department if you develop any new or worsening symptoms.

## 2017-02-20 NOTE — ED Provider Notes (Signed)
[facility] EMERGENCY DEPARTMENT Provider Note   CSN: [id number] Arrival date & time: 02/20/17  5573     History   Chief Complaint Chief Complaint  Patient presents with  . Fall    HPI Erin Nichols is a 51 y.o. female.  HPI   Ms. Erin Nichols is a 51 year old female with a history of nasal septum surgery, left sinus surgery, hypertension, asthma who presents to the emergency department for evaluation of nose, right knee and left foot pain following a mechanical fall yesterday.  Patient states that she fell forward over a curb at about 1300 yesterday afternoon.  She hit her face with the fall.  Denies loss of consciousness.  States that she takes 81 mg aspirin daily, denies other blood thinner use.  States that she has pain over the upper lip and nose.  Denies headaches, nausea/vomiting, numbness, weakness, vision problems.  States that she also has a 6/10 severity lateral left ankle pain which is worsened with ankle dorsiflexion.  She also reports moderate right knee pain over an abrasion which she has over the skin.  She has taken Tylenol for her symptoms without significant improvement.  States that her last tetanus shot was 3 years ago. She is able to ambulate independently.   Past Medical History:  Diagnosis Date  . Allergy   . Asthma   . GERD (gastroesophageal reflux disease) 2005  . 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 Hyperlipidemia 2012  . Hypertension 2012  . Vitamin D deficiency     Patient Active Problem List   Diagnosis Date Noted  . Overweight (BMI 25.0-29.9) 02/13/2017  . Essential hypertension 11/08/2016  . Hyperlipidemia, mixed 11/08/2016  . Abnormal glucose 11/08/2016  . Vitamin D deficiency 11/08/2016  . Gastroesophageal reflux disease 11/08/2016  . Asthma due to environmental allergies 11/08/2016  . Depression, controlled 11/08/2016    Past Surgical History:  Procedure Laterality Date  . COLONOSCOPY N/A 2015   screening at age 71  - Negative  . FRACTURE SURGERY  1988   facial Fx , nose, Ankle both Arms  . l ulnar nonunion  1992  . NASAL SEPTUM SURGERY  1991  . nonunion 5th metatarsal  2008  . r sub talar fusion  2008  . redo subtalar fusion  2013  . rt ankle surg for non union  2013  . SPINE SURGERY  2011   Cx5 - Cx6 fusion w/plate/screws  . TONSILLECTOMY AND ADENOIDECTOMY  1972    OB History    No data available       Home Medications    Prior to Admission medications   Medication Sig Start Date End Date Taking? Authorizing Provider  ALPRAZolam Duanne Moron) 0.5 MG tablet Take 1/2 to 1 tablet / daily if needed for anxiety and please try to limit to 5 days /week to avoid addiction 01/25/17  Yes Unk Pinto, MD  aspirin 81 MG chewable tablet Chew by mouth daily.   Yes [provider]  atorvastatin (LIPITOR) 20 MG tablet TAKE 1 TABLET BY MOUTH ONCE A DAY 01/25/17  Yes Unk Pinto, MD  benzonatate (TESSALON) 100 MG capsule TK ONE C PO TID PRN 10/17/16  Yes [provider]  buPROPion (WELLBUTRIN XL) 300 MG 24 hr tablet Take 1 tablet (300 mg total) by mouth every morning. 02/15/17  Yes [name], NP  calcium citrate (CALCITRATE - DOSED IN MG ELEMENTAL CALCIUM) 950 MG tablet Take 200 mg of elemental calcium by mouth daily.  Yes [provider]  citalopram (CELEXA) 20 MG tablet TAKE ONE TABLET BY MOUTH DAILY. 01/25/17  Yes Unk Pinto, MD  CRANBERRY EXTRACT PO Take 1 capsule by mouth daily.   Yes [provider]  cyclobenzaprine (FLEXERIL) 10 MG tablet Take 0.5-1 tablets (5-10 mg total) by mouth 3 (three) times daily as needed. 02/15/17  Yes [name], NP  DULERA 100-5 MCG/ACT AERO INL 2 PFS PO BID 10/18/16  Yes [provider]  ibuprofen (ADVIL,MOTRIN) 600 MG tablet Take 600 mg by mouth every 6 (six) hours as needed.   Yes [provider]  lisinopril (PRINIVIL,ZESTRIL) 20 MG tablet TAKE 1 TABLET BY MOUTH EVERY DAY 01/25/17  Yes Unk Pinto,  MD  montelukast (SINGULAIR) 10 MG tablet TK 1 T PO ONCE A DAY 10/17/16  Yes [provider]  Omega-3 Fatty Acids (OMEGA-3 CF PO) Take 1 capsule by mouth daily.   Yes [provider]  pantoprazole (PROTONIX) 40 MG tablet TAKE 1 TABLET BY MOUTH TWICE DAILY 01/25/17  Yes Unk Pinto, MD  Prenatal Vit-Fe Fumarate-FA (M-VIT PO) Take 1 tablet by mouth daily.   Yes [provider]  PROAIR HFA 108 [phone] Base) MCG/ACT inhaler INHALE 1 PUFF PO Q 4 H 11/14/16  Yes Unk Pinto, MD  ranitidine (ZANTAC) 150 MG tablet Take 1 tablet (150 mg total) by mouth 2 (two) times daily. 02/15/17 02/15/18 Yes [name], NP  vitamin B-12 (CYANOCOBALAMIN) 100 MCG tablet Take 100 mcg by mouth 2 (two) times daily.   Yes [provider]  Vitamin D, Ergocalciferol, (DRISDOL) 50000 units CAPS capsule TK ONE C PO ONCE A WEEK 10/11/16  Yes [provider]  XYZAL ALLERGY 24HR 5 MG tablet TK 1 T PO ONCE A DAY IN THE EVE 10/17/16  Yes [provider]    Family History Family History  Problem Relation Age of Onset  . Diabetes Mother   . Heart disease Father   . Hypertension Brother   . Heart disease Brother   . Stroke Maternal Grandmother     Social History Social History   Tobacco Use  . Smoking status: Never Smoker  . Smokeless tobacco: Never Used  Substance Use Topics  . Alcohol use: No  . Drug use: No     Allergies   Patient has no known allergies.   Review of Systems Review of Systems  Constitutional: Negative for fever.  HENT: Negative for facial swelling, nosebleeds and trouble swallowing.   Eyes: Negative for pain and visual disturbance.  Respiratory: Negative for shortness of breath.   Cardiovascular: Negative for chest pain.  Gastrointestinal: Negative for abdominal pain, nausea and vomiting.  Genitourinary: Negative for difficulty urinating.  Musculoskeletal: Positive for arthralgias (left foot and right knee).  Skin: Positive for  wound (abrasion over right knee and upper lip). Negative for rash.  Neurological: Negative for dizziness, weakness, light-headedness, numbness and headaches.     Physical Exam Updated Vital Signs BP 116/80 (BP Location: Right Arm)   Pulse (!) 105   Temp 98.6 F (37 C) (Oral)   Resp 16   Ht 5\' 2"  (1.575 m)   Wt 73.9 kg (163 lb)   SpO2 96%   BMI 29.81 kg/m   Physical Exam  Constitutional: She is oriented to person, place, and time. She appears well-developed and well-nourished. No distress.  HENT:  Head: Normocephalic and atraumatic.  Right Ear: External ear normal.  Left Ear: External ear normal.  Mouth/Throat: No oropharyngeal exudate.  Small abrasion over upper lip.  No lip laceration. No nasal septum hematoma.  Bilateral TMs with good cone of light, no hemotympanum.  No facial swelling.  Mild tenderness to palpation grossly over the nose.  Eyes: Conjunctivae and EOM are normal. Pupils are equal, round, and reactive to light. Right eye exhibits no discharge. Left eye exhibits no discharge.  Neck: Normal range of motion. Neck supple.  No midline cervical spine tenderness.  Cardiovascular: Normal rate, regular rhythm and intact distal pulses. Exam reveals no friction rub.  No murmur heard. Pulmonary/Chest: Effort normal and breath sounds normal. No stridor. No respiratory distress. She has no wheezes. She has no rales.  Abdominal: Soft. Bowel sounds are normal. There is no tenderness. There is no guarding.  Musculoskeletal:  Right knee with tenderness to palpation over abrasion which is on the lateral aspect of the patella. Full active ROM. No joint line tenderness. No joint effusion or swelling appreciated. No abnormal alignment or patellar mobility. No bruising, erythema or warmth overlaying the joint. No varus/valgus laxity. Negative drawer's.  No crepitus. 2+ DP pulses bilaterally. All compartments are soft. Sensation intact distal to injury.  Left ankle with tenderness to  palpation of lateral malleolus. Mild swelling over lateral ankle. Full active ROM, although painful. No erythema, ecchymosis, or deformity appreciated. No break in skin. No pain to fifth metatarsal area or navicular region. Achilles intact. Good pedal pulse and cap refill of toes.    Neurological: She is alert and oriented to person, place, and time. Coordination normal.  Mental Status:  Alert, oriented, thought content appropriate, able to give a coherent history. Speech fluent without evidence of aphasia. Able to follow 2 step commands without difficulty.  Cranial Nerves:  II:  Peripheral visual fields grossly normal, pupils equal, round, reactive to light III,IV, VI: ptosis not present, extra-ocular motions intact bilaterally  V,VII: smile symmetric, facial light touch sensation equal VIII: hearing grossly normal to voice  X: uvula elevates symmetrically  XI: bilateral shoulder shrug symmetric and strong XII: midline tongue extension without fassiculations Motor:  Normal tone. 5/5 in upper and lower extremities bilaterally including strong and equal grip strength and dorsiflexion/plantar flexion Sensory: Pinprick and light touch normal in all extremities.  Deep Tendon Reflexes: 2+ and symmetric in the biceps and patella Cerebellar: normal finger-to-nose with bilateral upper extremities Gait: normal gait and balance  Skin: Skin is warm and dry. Capillary refill takes less than 2 seconds. She is not diaphoretic.  Psychiatric: She has a normal mood and affect. Her behavior is normal.  Nursing note and vitals reviewed.    ED Treatments / Results  Labs (all labs ordered are listed, but only abnormal results are displayed) Labs Reviewed - No data to display  EKG  EKG Interpretation None       Radiology No results found.  Procedures Procedures (including critical care time)  Medications Ordered in ED Medications - No data to display   Initial Impression / Assessment and  Plan / ED Course  I have reviewed the triage vital signs and the nursing notes.  Pertinent labs & imaging results that were available during my care of the patient were reviewed by me and considered in my medical decision making (see chart for details).    Patient presents after a mechanical fall yesterday afternoon. Complaining of nose pain, right knee and left ankle pain. She has a history of sinus surgery and deviated nasal septum. Do not suspect closed head injury as she has no neurological  deficits on exam. No signs of trauma. No headache, vision changes, numbness/weakness, vomiting.  CT maxillofacial scan without acute fracture or abnormality. Xray of her left ankle and right knee negative for acute fracture or abnormality. Have counseled her on use of NSAIDs and RICE protocol for pain. She states she has an ankle brace at home for support.   She has an abrasion over her right knee and upper lip, have counseled her on general washing and wound care. Her tetanus is up to date.   Patient's pain controlled in the ED and she has no complaints prior to discharge. Have counseled her on return precautions and patient agrees and voices understanding to the above plan. She is able to ambulate independently.    Final Clinical Impressions(s) / ED Diagnoses   Final diagnoses:  None    ED Discharge Orders    None       Bernarda Caffey 02/20/17 1823    Virgel Manifold, MD 02/21/17 [phone]

## 2017-02-20 NOTE — ED Triage Notes (Signed)
Pt reports tripping on a curb yesterday around 1300 and landing on her face; denies LOC. Present with pain to nose, bil knee pain and L foot pain. Pt able to ambulate with slight limp. Abrasions noted to lip directly under nose and to R knee. Pt alert and oriented. Takes ASA 81mg  daily.

## 2017-03-03 ENCOUNTER — Other Ambulatory Visit: Payer: Self-pay | Admitting: Internal Medicine

## 2017-03-03 ENCOUNTER — Other Ambulatory Visit: Payer: Self-pay | Admitting: *Deleted

## 2017-03-03 DIAGNOSIS — J45909 Unspecified asthma, uncomplicated: Secondary | ICD-10-CM

## 2017-03-03 DIAGNOSIS — F32A Depression, unspecified: Secondary | ICD-10-CM

## 2017-03-03 DIAGNOSIS — K219 Gastro-esophageal reflux disease without esophagitis: Secondary | ICD-10-CM

## 2017-03-03 DIAGNOSIS — F329 Major depressive disorder, single episode, unspecified: Secondary | ICD-10-CM

## 2017-03-03 DIAGNOSIS — I10 Essential (primary) hypertension: Secondary | ICD-10-CM

## 2017-03-03 DIAGNOSIS — Z79899 Other long term (current) drug therapy: Secondary | ICD-10-CM

## 2017-03-03 DIAGNOSIS — E782 Mixed hyperlipidemia: Secondary | ICD-10-CM

## 2017-03-03 MED ORDER — RANITIDINE HCL 150 MG PO TABS: 150.0000 mg | ORAL_TABLET | Freq: Two times a day (BID) | ORAL | 1 refills | Status: DC

## 2017-03-03 MED ORDER — IBUPROFEN 600 MG PO TABS: 600.0000 mg | ORAL_TABLET | Freq: Four times a day (QID) | ORAL | 1 refills | Status: DC | PRN

## 2017-03-03 MED ORDER — CYCLOBENZAPRINE HCL 10 MG PO TABS: 5.0000 mg | ORAL_TABLET | Freq: Three times a day (TID) | ORAL | 1 refills | Status: DC | PRN

## 2017-03-03 MED ORDER — ALPRAZOLAM 0.5 MG PO TABS: ORAL_TABLET | ORAL | 0 refills | Status: DC

## 2017-03-03 MED ORDER — ATORVASTATIN CALCIUM 20 MG PO TABS: 20.0000 mg | ORAL_TABLET | Freq: Every day | ORAL | 1 refills | Status: DC

## 2017-03-03 MED ORDER — CITALOPRAM HYDROBROMIDE 20 MG PO TABS: 20.0000 mg | ORAL_TABLET | Freq: Every day | ORAL | 1 refills | Status: DC

## 2017-03-03 MED ORDER — PROAIR HFA 108 (90 BASE) MCG/ACT IN AERS: INHALATION_SPRAY | RESPIRATORY_TRACT | 1 refills | Status: DC

## 2017-03-03 MED ORDER — LISINOPRIL 20 MG PO TABS: ORAL_TABLET | ORAL | 1 refills | Status: DC

## 2017-03-03 MED ORDER — PANTOPRAZOLE SODIUM 40 MG PO TBEC: 40.0000 mg | DELAYED_RELEASE_TABLET | Freq: Two times a day (BID) | ORAL | 1 refills | Status: DC

## 2017-03-03 MED ORDER — BUPROPION HCL ER (XL) 300 MG PO TB24: 300.0000 mg | ORAL_TABLET | Freq: Every morning | ORAL | 1 refills | Status: DC

## 2017-03-03 MED ORDER — DULERA 100-5 MCG/ACT IN AERO: INHALATION_SPRAY | RESPIRATORY_TRACT | 1 refills | Status: DC

## 2017-03-03 MED ORDER — MONTELUKAST SODIUM 10 MG PO TABS: ORAL_TABLET | ORAL | 1 refills | Status: DC

## 2017-03-06 ENCOUNTER — Other Ambulatory Visit: Payer: Self-pay | Admitting: *Deleted

## 2017-03-06 DIAGNOSIS — E559 Vitamin D deficiency, unspecified: Secondary | ICD-10-CM

## 2017-03-06 DIAGNOSIS — J45909 Unspecified asthma, uncomplicated: Secondary | ICD-10-CM

## 2017-03-06 MED ORDER — BENZONATATE 100 MG PO CAPS: ORAL_CAPSULE | ORAL | 1 refills | Status: DC

## 2017-03-06 MED ORDER — VITAMIN D (ERGOCALCIFEROL) 1.25 MG (50000 UNIT) PO CAPS: ORAL_CAPSULE | ORAL | 1 refills | Status: DC

## 2017-03-06 MED ORDER — XYZAL ALLERGY 24HR 5 MG PO TABS: ORAL_TABLET | ORAL | 3 refills | Status: DC

## 2017-03-07 ENCOUNTER — Other Ambulatory Visit: Payer: Self-pay | Admitting: Adult Health

## 2017-03-07 ENCOUNTER — Other Ambulatory Visit: Payer: Self-pay | Admitting: Internal Medicine

## 2017-03-07 DIAGNOSIS — Z1231 Encounter for screening mammogram for malignant neoplasm of breast: Secondary | ICD-10-CM

## 2017-03-17 ENCOUNTER — Other Ambulatory Visit: Payer: Self-pay | Admitting: *Deleted

## 2017-03-17 DIAGNOSIS — J45909 Unspecified asthma, uncomplicated: Secondary | ICD-10-CM

## 2017-03-17 MED ORDER — BENZONATATE 100 MG PO CAPS: ORAL_CAPSULE | ORAL | 0 refills | Status: DC

## 2017-03-27 ENCOUNTER — Ambulatory Visit
Admission: RE | Admit: 2017-03-27 | Discharge: 2017-03-27 | Disposition: A | Discharge status: Home / Self Care | Payer: BLUE CROSS/BLUE SHIELD | Source: Ambulatory Visit | Attending: Internal Medicine | Admitting: Internal Medicine

## 2017-03-27 DIAGNOSIS — Z1231 Encounter for screening mammogram for malignant neoplasm of breast: Secondary | ICD-10-CM

## 2017-04-17 ENCOUNTER — Other Ambulatory Visit: Payer: Self-pay | Admitting: Internal Medicine

## 2017-04-17 DIAGNOSIS — J45909 Unspecified asthma, uncomplicated: Secondary | ICD-10-CM

## 2017-04-17 DIAGNOSIS — F32A Depression, unspecified: Secondary | ICD-10-CM

## 2017-04-17 DIAGNOSIS — F329 Major depressive disorder, single episode, unspecified: Secondary | ICD-10-CM

## 2017-04-17 DIAGNOSIS — Z79899 Other long term (current) drug therapy: Secondary | ICD-10-CM

## 2017-05-17 NOTE — Progress Notes (Signed)
This very nice 52 y.o. MWF  presents for 6 month follow up with HTN, HLD, Pre-Diabetes and Vitamin D Deficiency. Patient has GERD controlled on her meds. Patient also has c/o fullness in ears, sinus pressure /congestion  & drainage and productive cough.       Patient is treated for HTN (2012) & BP has been controlled at home. Today's BP is at goal - 126/80. Patient has had no complaints of any cardiac type chest pain, palpitations, dyspnea / orthopnea / PND, dizziness, claudication, or dependent edema.     Hyperlipidemia is controlled with diet & meds. Patient denies myalgias or other med SE's. Last Lipids were at goal: Lab Results  Component Value Date   CHOL 157 02/15/2017   HDL 55 02/15/2017   LDLCALC 82 02/15/2017   TRIG 108 02/15/2017   CHOLHDL 2.9 02/15/2017      Also, the patient is monitored expectantly for PreDiabetes and has had no symptoms of reactive hypoglycemia, diabetic polys, paresthesias or visual blurring.  Last A1c was Normal & at goal: Lab Results  Component Value Date   HGBA1C 4.8 11/08/2016      Further, the patient also has history of Vitamin D Deficiency ("27" in 2015)  and supplements vitamin D without any suspected side-effects. Last vitamin D was near goal:  Lab Results  Component Value Date   VD25OH 53 02/15/2017   Current Outpatient Medications on File Prior to Visit  Medication Sig  . ALPRAZolam (XANAX) 0.5 MG tablet take 1/2 to 1 tablet by mouth once daily if needed for anxiety (PLEASE LIMIT TO 5 DAYS A WEEK TO AVOID ADDICTION)  . aspirin 81 MG chewable tablet Chew by mouth daily.  Marland Kitchen atorvastatin (LIPITOR) 20 MG tablet Take 1 tablet (20 mg total) by mouth daily.  . benzonatate (TESSALON) 100 MG capsule take 1 capsule by mouth three times a day if needed  . buPROPion (WELLBUTRIN XL) 300 MG 24 hr tablet Take 1 tablet (300 mg total) by mouth every morning.  . calcium citrate (CALCITRATE - DOSED IN MG ELEMENTAL CALCIUM) 950 MG tablet Take 200 mg of  elemental calcium by mouth daily.  . citalopram (CELEXA) 20 MG tablet Take 1 tablet (20 mg total) by mouth daily.  Marland Kitchen CRANBERRY EXTRACT PO Take 1 capsule by mouth daily.  . cyclobenzaprine (FLEXERIL) 10 MG tablet Take 0.5-1 tablets (5-10 mg total) by mouth 3 (three) times daily as needed.  . DULERA 100-5 MCG/ACT AERO INL 2 PFS PO BID  . ibuprofen (ADVIL,MOTRIN) 600 MG tablet Take 1 tablet (600 mg total) by mouth every 6 (six) hours as needed.  Marland Kitchen lisinopril (PRINIVIL,ZESTRIL) 20 MG tablet TAKE 1 TABLET BY MOUTH EVERY DAY  . montelukast (SINGULAIR) 10 MG tablet TK 1 T PO ONCE A DAY  . Omega-3 Fatty Acids (OMEGA-3 CF PO) Take 1 capsule by mouth daily.  . pantoprazole (PROTONIX) 40 MG tablet Take 1 tablet (40 mg total) by mouth 2 (two) times daily.  . Prenatal Vit-Fe Fumarate-FA (M-VIT PO) Take 1 tablet by mouth daily.  Marland Kitchen PROAIR HFA 108 (90 Base) MCG/ACT inhaler INHALE 1 PUFF PO Q 4 H  . ranitidine (ZANTAC) 150 MG tablet Take 1 tablet (150 mg total) by mouth 2 (two) times daily.  . vitamin B-12 (CYANOCOBALAMIN) 100 MCG tablet Take 100 mcg by mouth 2 (two) times daily.  . Vitamin D, Ergocalciferol, (DRISDOL) 50000 units CAPS capsule TK ONE C PO ONCE A WEEK  . XYZAL ALLERGY  24HR 5 MG tablet TK 1 T PO ONCE A DAY IN THE EVE   No current facility-administered medications on file prior to visit.    No Known Allergies   PMHx:   Past Medical History:  Diagnosis Date  . Allergy   . Asthma   . GERD (gastroesophageal reflux disease) 2005  . 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 Hyperlipidemia 2012  . Hypertension 2012  . Vitamin D deficiency    Immunization History  Administered Date(s) Administered  . Influenza Inj Mdck Quad With Preservative 11/08/2016  . PPD Test 11/08/2016  . Pneumococcal-Unspecified 02/22/2015  . Td 02/22/2012  . Zoster 02/22/2015   Past Surgical History:  Procedure Laterality Date  . COLONOSCOPY N/A 2015   screening at age 63 - Negative  . FRACTURE SURGERY   1988   facial Fx , nose, Ankle both Arms  . l ulnar nonunion  1992  . NASAL SEPTUM SURGERY  1991  . nonunion 5th metatarsal  2008  . r sub talar fusion  2008  . redo subtalar fusion  2013  . rt ankle surg for non union  2013  . SPINE SURGERY  2011   Cx5 - Cx6 fusion w/plate/screws  . TONSILLECTOMY AND ADENOIDECTOMY  1972   FHx:    Reviewed / unchanged  SHx:    Reviewed / unchanged  Systems Review:  Constitutional: Denies fever, chills, wt changes, headaches, insomnia, fatigue, night sweats, change in appetite. Eyes: Denies redness, blurred vision, diplopia, discharge, itchy, watery eyes.  ENT: Denies discharge, congestion, post nasal drip, epistaxis, sore throat, earache, hearing loss, dental pain, tinnitus, vertigo, sinus pain, snoring.  CV: Denies chest pain, palpitations, irregular heartbeat, syncope, dyspnea, diaphoresis, orthopnea, PND, claudication or edema. Respiratory: denies cough, dyspnea, DOE, pleurisy, hoarseness, laryngitis, wheezing.  Gastrointestinal: Denies dysphagia, odynophagia, heartburn, reflux, water brash, abdominal pain or cramps, nausea, vomiting, bloating, diarrhea, constipation, hematemesis, melena, hematochezia  or hemorrhoids. Genitourinary: Denies dysuria, frequency, urgency, nocturia, hesitancy, discharge, hematuria or flank pain. Musculoskeletal: Denies arthralgias, myalgias, stiffness, jt. swelling, pain, limping or strain/sprain.  Skin: Denies pruritus, rash, hives, warts, acne, eczema or change in skin lesion(s). Neuro: No weakness, tremor, incoordination, spasms, paresthesia or pain. Psychiatric: Denies confusion, memory loss or sensory loss. Endo: Denies change in weight, skin or hair change.  Heme/Lymph: No excessive bleeding, bruising or enlarged lymph nodes.  Physical Exam  BP 126/80   Pulse 100   Temp (!) 97 F (36.1 C)   Resp 16   Ht 5\' 2"  (1.575 m)   Wt 164 lb 9.6 oz (74.7 kg)   BMI 30.11 kg/m   Appears  well nourished, well groomed   and in no distress. (+) congested cough.  Eyes: PERRLA, EOMs, conjunctiva no swelling or erythema. Sinuses: (+)  frontal/maxillary tenderness ENT/Mouth: EAC's clear, TM's nl w/o erythema, bulging. Nares clear w/o erythema, swelling, exudates. Oropharynx clear without erythema or exudates. Oral hygiene is good. Tongue normal, non obstructing. Hearing intact.  Neck: Supple. Thyroid not palpable. Car 2+/2+ without bruits, nodes or JVD. Chest: Respirations nl with BS  w/few scattered  Rales and rhonchi and no wheezing or stridor.  Cor: Heart sounds normal w/ regular rate and rhythm without sig. murmurs, gallops, clicks or rubs. Peripheral pulses normal and equal  without edema.  Abdomen: Soft & bowel sounds normal. Non-tender w/o guarding, rebound, hernias, masses or organomegaly.  Lymphatics: Unremarkable.  Musculoskeletal: Full ROM all peripheral extremities, joint stability, 5/5 strength and normal gait.  Skin:  Warm, dry without exposed rashes, lesions or ecchymosis apparent.  Neuro: Cranial nerves intact, reflexes equal bilaterally. Sensory-motor testing grossly intact. Tendon reflexes grossly intact.  Pysch: Alert & oriented x 3.  Insight and judgement nl & appropriate. No ideations.  Assessment and Plan:  1. Essential hypertension  - Continue medication, monitor blood pressure at home.  - Continue DASH diet.  Reminder to go to the ER if any CP,  SOB, nausea, dizziness, severe HA, changes vision/speech.  - CBC with Differential/Platelet - BASIC METABOLIC PANEL WITH GFR - Magnesium - TSH  2. Hyperlipidemia, mixed  - Continue diet/meds, exercise,& lifestyle modifications.  - Continue monitor periodic cholesterol/liver & renal functions   - Hepatic function panel - Lipid panel - TSH  3. Abnormal glucose  - Hemoglobin A1c - Insulin, random  4. Vitamin D deficiency  - Continue diet, exercise,  - lifestyle modifications.  - Monitor appropriate labs. - Continue  supplementation.  - VITAMIN D 25 Hydroxyl  5. Gastroesophageal reflux disease  - CBC with Differential/Platelet  6. Sinuitis / Bronchitis   - Rx Z-Pak w/ Rf , Prednisone 20 mg #20 tabs, Tessalon 200 mg perles and Phenergan / DM cough syrup  7. Medication management  - CBC with Differential/Platelet - BASIC METABOLIC PANEL WITH GFR - Hepatic function panel - Magnesium - Lipid panel - TSH - Hemoglobin A1c - Insulin, random - VITAMIN D 25 Hydroxyl           Discussed  regular exercise, BP monitoring, weight control to achieve/maintain BMI less than 25 and discussed med and SE's. Recommended labs to assess and monitor clinical status with further disposition pending results of labs. Over 30 minutes of exam, counseling, chart review was performed.

## 2017-05-17 NOTE — Patient Instructions (Signed)

## 2017-05-18 ENCOUNTER — Encounter: Payer: Self-pay | Admitting: Internal Medicine

## 2017-05-18 ENCOUNTER — Ambulatory Visit (INDEPENDENT_AMBULATORY_CARE_PROVIDER_SITE_OTHER): Payer: BLUE CROSS/BLUE SHIELD | Admitting: Internal Medicine

## 2017-05-18 VITALS — BP 126/80 | HR 100 | Temp 97.0°F | Resp 16 | Ht 62.0 in | Wt 164.6 lb

## 2017-05-18 DIAGNOSIS — K219 Gastro-esophageal reflux disease without esophagitis: Secondary | ICD-10-CM | POA: Diagnosis not present

## 2017-05-18 DIAGNOSIS — J0410 Acute tracheitis without obstruction: Secondary | ICD-10-CM | POA: Diagnosis not present

## 2017-05-18 DIAGNOSIS — E782 Mixed hyperlipidemia: Secondary | ICD-10-CM | POA: Diagnosis not present

## 2017-05-18 DIAGNOSIS — J0140 Acute pansinusitis, unspecified: Secondary | ICD-10-CM | POA: Diagnosis not present

## 2017-05-18 DIAGNOSIS — Z79899 Other long term (current) drug therapy: Secondary | ICD-10-CM | POA: Diagnosis not present

## 2017-05-18 DIAGNOSIS — I10 Essential (primary) hypertension: Secondary | ICD-10-CM | POA: Diagnosis not present

## 2017-05-18 DIAGNOSIS — R7309 Other abnormal glucose: Secondary | ICD-10-CM

## 2017-05-18 DIAGNOSIS — E559 Vitamin D deficiency, unspecified: Secondary | ICD-10-CM

## 2017-05-18 MED ORDER — AZITHROMYCIN 250 MG PO TABS: ORAL_TABLET | ORAL | 1 refills | Status: DC

## 2017-05-18 MED ORDER — PROMETHAZINE-DM 6.25-15 MG/5ML PO SYRP: ORAL_SOLUTION | ORAL | 1 refills | Status: DC

## 2017-05-18 MED ORDER — BENZONATATE 200 MG PO CAPS: ORAL_CAPSULE | ORAL | 1 refills | Status: DC

## 2017-05-19 LAB — CBC WITH DIFFERENTIAL/PLATELET
Basophils Absolute: 60 cells/uL (ref 0–200)
Basophils Relative: 1.2 %
EOS ABS: 130 {cells}/uL (ref 15–500)
Eosinophils Relative: 2.6 %
HEMATOCRIT: 34.6 % — AB (ref 35.0–45.0)
HEMOGLOBIN: 11.9 g/dL (ref 11.7–15.5)
LYMPHS ABS: 1110 {cells}/uL (ref 850–3900)
MCH: 31.5 pg (ref 27.0–33.0)
MCHC: 34.4 g/dL (ref 32.0–36.0)
MCV: 91.5 fL (ref 80.0–100.0)
MPV: 10.6 fL (ref 7.5–12.5)
Monocytes Relative: 7.7 %
NEUTROS ABS: 3315 {cells}/uL (ref 1500–7800)
Neutrophils Relative %: 66.3 %
Platelets: 232 10*3/uL (ref 140–400)
RBC: 3.78 10*6/uL — AB (ref 3.80–5.10)
RDW: 12.1 % (ref 11.0–15.0)
Total Lymphocyte: 22.2 %
WBC: 5 10*3/uL (ref 3.8–10.8)
WBCMIX: 385 {cells}/uL (ref 200–950)

## 2017-05-19 LAB — BASIC METABOLIC PANEL WITH GFR
BUN: 15 mg/dL (ref 7–25)
CALCIUM: 9.3 mg/dL (ref 8.6–10.4)
CO2: 30 mmol/L (ref 20–32)
CREATININE: 1.03 mg/dL (ref 0.50–1.05)
Chloride: 104 mmol/L (ref 98–110)
GFR, EST AFRICAN AMERICAN: 72 mL/min/{1.73_m2} (ref >=60)
GFR, Est Non African American: 62 mL/min/{1.73_m2} (ref >=60)
Glucose, Bld: 74 mg/dL (ref 65–99)
Potassium: 4.7 mmol/L (ref 3.5–5.3)
Sodium: 140 mmol/L (ref 135–146)

## 2017-05-19 LAB — LIPID PANEL
CHOL/HDL RATIO: 2.8 (calc) (ref <5.0)
CHOLESTEROL: 139 mg/dL (ref <200)
HDL: 49 mg/dL — ABNORMAL LOW (ref >50)
LDL CHOLESTEROL (CALC): 69 mg/dL
Non-HDL Cholesterol (Calc): 90 mg/dL (calc) (ref <130)
TRIGLYCERIDES: 130 mg/dL (ref <150)

## 2017-05-19 LAB — HEMOGLOBIN A1C
EAG (MMOL/L): 5.5 (calc)
HEMOGLOBIN A1C: 5.1 %{Hb} (ref <5.7)
Mean Plasma Glucose: 100 (calc)

## 2017-05-19 LAB — HEPATIC FUNCTION PANEL
AG RATIO: 1.7 (calc) (ref 1.0–2.5)
ALBUMIN MSPROF: 4.3 g/dL (ref 3.6–5.1)
ALT: 14 U/L (ref 6–29)
AST: 16 U/L (ref 10–35)
Alkaline phosphatase (APISO): 126 U/L (ref 33–130)
BILIRUBIN DIRECT: 0.1 mg/dL (ref 0.0–0.2)
BILIRUBIN INDIRECT: 0.3 mg/dL (ref 0.2–1.2)
Globulin: 2.5 g/dL (calc) (ref 1.9–3.7)
Total Bilirubin: 0.4 mg/dL (ref 0.2–1.2)
Total Protein: 6.8 g/dL (ref 6.1–8.1)

## 2017-05-19 LAB — TSH: TSH: 2.39 m[IU]/L

## 2017-05-19 LAB — VITAMIN D 25 HYDROXY (VIT D DEFICIENCY, FRACTURES): Vit D, 25-Hydroxy: 51 ng/mL (ref 30–100)

## 2017-05-19 LAB — MAGNESIUM: Magnesium: 2.2 mg/dL (ref 1.5–2.5)

## 2017-05-19 LAB — INSULIN, RANDOM: Insulin: 5.8 u[IU]/mL (ref 2.0–19.6)

## 2017-05-25 ENCOUNTER — Other Ambulatory Visit: Payer: Self-pay | Admitting: Internal Medicine

## 2017-05-26 ENCOUNTER — Telehealth: Payer: Self-pay | Admitting: *Deleted

## 2017-05-26 NOTE — Telephone Encounter (Signed)
Patient called and request an RX for Tussinex cough syrup.  Per Dr Melford Aase, the patient has a current RX for Tessalon perles and Phenergan DM.  He states he does not RX tussinex ,due to the addicting possibility. A message was left to inform the patient.

## 2017-05-29 ENCOUNTER — Other Ambulatory Visit: Payer: Self-pay | Admitting: Internal Medicine

## 2017-05-29 ENCOUNTER — Other Ambulatory Visit: Payer: Self-pay | Admitting: Physician Assistant

## 2017-05-29 DIAGNOSIS — F32A Depression, unspecified: Secondary | ICD-10-CM

## 2017-05-29 DIAGNOSIS — F329 Major depressive disorder, single episode, unspecified: Secondary | ICD-10-CM

## 2017-05-29 DIAGNOSIS — Z79899 Other long term (current) drug therapy: Secondary | ICD-10-CM

## 2017-06-28 ENCOUNTER — Other Ambulatory Visit: Payer: Self-pay

## 2017-06-28 ENCOUNTER — Other Ambulatory Visit: Payer: Self-pay | Admitting: Adult Health

## 2017-06-28 DIAGNOSIS — Z1212 Encounter for screening for malignant neoplasm of rectum: Principal | ICD-10-CM

## 2017-06-28 DIAGNOSIS — Z1211 Encounter for screening for malignant neoplasm of colon: Secondary | ICD-10-CM

## 2017-06-28 DIAGNOSIS — Z79899 Other long term (current) drug therapy: Secondary | ICD-10-CM

## 2017-06-28 LAB — POC HEMOCCULT BLD/STL (HOME/3-CARD/SCREEN)
Card #2 Fecal Occult Blod, POC: NEGATIVE
Card #3 Fecal Occult Blood, POC: NEGATIVE
FECAL OCCULT BLD: NEGATIVE

## 2017-07-12 ENCOUNTER — Other Ambulatory Visit: Payer: Self-pay | Admitting: Internal Medicine

## 2017-07-12 DIAGNOSIS — Z79899 Other long term (current) drug therapy: Secondary | ICD-10-CM

## 2017-07-12 DIAGNOSIS — F32A Depression, unspecified: Secondary | ICD-10-CM

## 2017-07-12 DIAGNOSIS — F329 Major depressive disorder, single episode, unspecified: Secondary | ICD-10-CM

## 2017-07-13 ENCOUNTER — Other Ambulatory Visit: Payer: Self-pay

## 2017-07-13 MED ORDER — PROMETHAZINE HCL 6.25 MG/5ML PO SYRP: 6.2500 mg | ORAL_SOLUTION | Freq: Four times a day (QID) | ORAL | 0 refills | Status: DC | PRN

## 2017-07-13 MED ORDER — CYCLOBENZAPRINE HCL 10 MG PO TABS: ORAL_TABLET | ORAL | 0 refills | Status: DC

## 2017-07-13 NOTE — Telephone Encounter (Signed)
Cyclobenzaprine refill request. Also, Promethazine is on back order, asking for a substitute. Per Dr. Melford Aase, send in Phenergan Syrup.

## 2017-08-22 DIAGNOSIS — D2261 Melanocytic nevi of right upper limb, including shoulder: Secondary | ICD-10-CM | POA: Diagnosis not present

## 2017-08-22 DIAGNOSIS — D225 Melanocytic nevi of trunk: Secondary | ICD-10-CM | POA: Diagnosis not present

## 2017-08-22 DIAGNOSIS — D2262 Melanocytic nevi of left upper limb, including shoulder: Secondary | ICD-10-CM | POA: Diagnosis not present

## 2017-08-28 ENCOUNTER — Other Ambulatory Visit: Payer: Self-pay | Admitting: Internal Medicine

## 2017-08-28 ENCOUNTER — Other Ambulatory Visit: Payer: Self-pay | Admitting: Adult Health

## 2017-08-28 DIAGNOSIS — F329 Major depressive disorder, single episode, unspecified: Secondary | ICD-10-CM

## 2017-08-28 DIAGNOSIS — F32A Depression, unspecified: Secondary | ICD-10-CM

## 2017-08-28 DIAGNOSIS — Z79899 Other long term (current) drug therapy: Secondary | ICD-10-CM

## 2017-09-05 ENCOUNTER — Ambulatory Visit
Admission: RE | Admit: 2017-09-05 | Discharge: 2017-09-05 | Disposition: A | Discharge status: Home / Self Care | Payer: BLUE CROSS/BLUE SHIELD | Source: Ambulatory Visit | Attending: Otolaryngology | Admitting: Otolaryngology

## 2017-09-05 ENCOUNTER — Other Ambulatory Visit: Payer: Self-pay | Admitting: Otolaryngology

## 2017-09-05 DIAGNOSIS — J329 Chronic sinusitis, unspecified: Secondary | ICD-10-CM | POA: Diagnosis not present

## 2017-09-05 DIAGNOSIS — R05 Cough: Secondary | ICD-10-CM | POA: Diagnosis not present

## 2017-09-05 DIAGNOSIS — R059 Cough, unspecified: Secondary | ICD-10-CM

## 2017-09-05 DIAGNOSIS — J410 Simple chronic bronchitis: Secondary | ICD-10-CM

## 2017-09-11 NOTE — Progress Notes (Signed)
FOLLOW UP  Assessment and Plan:   Hypertension Well controlled with current medications  Monitor blood pressure at home; patient to call if consistently greater than 130/80 Continue DASH diet.   Reminder to go to the ER if any CP, SOB, nausea, dizziness, severe HA, changes vision/speech, left arm numbness and tingling and jaw pain.  Cholesterol At goal on statin; continue medication  Continue low cholesterol diet and exercise.  Check lipid panel.   Other abnormal glucose Discussed risks associated with elevated glucose Recommend prudent diet, portion control, maintenance of weight in normal range, regular aerobic exercise Defer A1C; check annually - monitor sugars by CMP  GERD Improved control with nexium; not a candidate for H2i at this time, monitor magnesium Discussed diet, avoiding triggers and other lifestyle changes  Obesity Long discussion about weight loss, diet, and exercise Recommended diet heavy in fruits and veggies and low in animal meats, cheeses, and dairy products, appropriate calorie intake Discussed appropriate weight for height Follow up in 3 months  Depression/anxiety Well controlled by current regimen; uses xanax sparingly Lifestyle discussed: diet/exerise, sleep hygiene, stress management, hydration  Vitamin D Def/ osteoporosis prevention Near goal at recent check;  Defer vitamin D level   Continue diet and meds as discussed. Further disposition pending results of labs. Discussed med's effects and SE's.   Over 30 minutes of exam, counseling, chart review, and critical decision making was performed.   Future Appointments  Date Time Provider [place]  09/15/2017  9:30 AM Brand Males, MD [facility] None  12/14/2017 11:00 AM Unk Pinto, MD [facility] None    ----------------------------------------------------------------------------------------------------------------------  HPI 52 y.o. female  presents for 3 month follow  up on hypertension, cholesterol, GERD, blood sugar monitoring, weight, depression and vitamin D deficiency.   BMI is Body mass index is 30.73 kg/m., she has been working on diet and exercise, she walks at least a mile a day, pushing water intake. 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  Wt Readings from Last 3 Encounters:  09/12/17 168 lb (76.2 kg)  05/18/17 164 lb 9.6 oz (74.7 kg)  02/20/17 163 lb (73.9 kg)   she has ongoing depression with anxious features and is currently on celexa 20 mg daily, wellbutrin XR 300 mg daily, xanax 0.5 mg PRN (currently taking 1/per day or so when having a particularly stressful day, uses 2-3 times per week), reports symptoms are well controlled on current regimen.   she has a diagnosis of GERD which is currently managed by nexium 40 mg - initiated recently by ENT she reports symptoms is currently well controlled, and denies breakthrough reflux, burning in chest, hoarseness, she was having mildly productive cough, improved with switch of agent.   Her blood pressure has been controlled at home, today their BP is BP: 110/70  She does workout. She denies chest pain, shortness of breath, dizziness.   She is on cholesterol medication and denies myalgias. Her cholesterol is at goal. The cholesterol last visit was:   Lab Results  Component Value Date   CHOL 139 05/18/2017   HDL 49 (L) 05/18/2017   LDLCALC 69 05/18/2017   TRIG 130 05/18/2017   CHOLHDL 2.8 05/18/2017    She has been working on diet and exercise for blood sugar control, and denies increased appetite, nausea, paresthesia of the feet, polydipsia, polyuria, visual disturbances and vomiting. Last A1C in the office was:  Lab Results  Component Value Date   HGBA1C 5.1  05/18/2017   Patient is on Vitamin D supplement  Lab Results  Component Value Date   VD25OH 51 05/18/2017      Current Medications:  Current Outpatient Medications on File Prior to Visit  Medication Sig   . ALPRAZolam (XANAX) 0.5 MG tablet TAKE 1/2 TO 1 TABLET BY MOUTH EVERY DAY AS NEEDED FOR ANIXETY(PLEASE LIMIT TO 5 DAYS A WEEK TO AVOID ADDICTION)  . aspirin 81 MG chewable tablet Chew by mouth daily.  Marland Kitchen atorvastatin (LIPITOR) 20 MG tablet Take 1 tablet (20 mg total) by mouth daily.  . benzonatate (TESSALON) 200 MG capsule Take 1 perle 3 x / day to prevent cough  . buPROPion (WELLBUTRIN XL) 300 MG 24 hr tablet Take 1 tablet (300 mg total) by mouth every morning.  . calcium citrate (CALCITRATE - DOSED IN MG ELEMENTAL CALCIUM) 950 MG tablet Take 200 mg of elemental calcium by mouth daily.  . citalopram (CELEXA) 20 MG tablet Take 1 tablet (20 mg total) by mouth daily.  Marland Kitchen CRANBERRY EXTRACT PO Take 1 capsule by mouth daily.  . cyclobenzaprine (FLEXERIL) 10 MG tablet TAKE 1/2 TO 1 TABLET BY MOUTH THREE TIMES DAILY AS NEEDED  . DULERA 100-5 MCG/ACT AERO INL 2 PFS PO BID  . esomeprazole (NEXIUM) 40 MG capsule Take 40 mg by mouth daily at 12 noon.  Marland Kitchen Fexofenadine HCl (ALLEGRA PO) Take by mouth.  Marland Kitchen ibuprofen (ADVIL,MOTRIN) 600 MG tablet TAKE 1 TABLET BY MOUTH EVERY 6 HOURS AS NEEDED  . lisinopril (PRINIVIL,ZESTRIL) 20 MG tablet TAKE 1 TABLET BY MOUTH EVERY DAY  . montelukast (SINGULAIR) 10 MG tablet TK 1 T PO ONCE A DAY  . Omega-3 Fatty Acids (OMEGA-3 CF PO) Take 1 capsule by mouth daily.  . Prenatal Vit-Fe Fumarate-FA (M-VIT PO) Take 1 tablet by mouth daily.  Marland Kitchen PROAIR HFA 108 (90 Base) MCG/ACT inhaler INHALE 1 PUFF PO Q 4 H  . vitamin B-12 (CYANOCOBALAMIN) 100 MCG tablet Take 100 mcg by mouth 2 (two) times daily.  . Vitamin D, Ergocalciferol, (DRISDOL) 50000 units CAPS capsule TK ONE C PO ONCE A WEEK   No current facility-administered medications on file prior to visit.      Allergies: No Known Allergies   Medical History:  Past Medical History:  Diagnosis Date  . Allergy   . Asthma   . GERD (gastroesophageal reflux disease) 2005  . 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  Hyperlipidemia 2012  . Hypertension 2012  . Vitamin D deficiency    Family history- Reviewed and unchanged Social history- Reviewed and unchanged   Review of Systems:  Review of Systems  Constitutional: Negative for malaise/fatigue and weight loss.  HENT: Negative for hearing loss and tinnitus.   Eyes: Negative for blurred vision and double vision.  Respiratory: Positive for cough (dry cough, improved with nexium, seeing ENT for this). Negative for shortness of breath and wheezing.   Cardiovascular: Negative for chest pain, palpitations, orthopnea, claudication and leg swelling.  Gastrointestinal: Negative for abdominal pain, blood in stool, constipation, diarrhea, heartburn, melena, nausea and vomiting.  Genitourinary: Negative.   Musculoskeletal: Negative for joint pain and myalgias.  Skin: Negative for rash.  Neurological: Negative for dizziness, tingling, sensory change, weakness and headaches.  Endo/Heme/Allergies: Negative for polydipsia.  Psychiatric/Behavioral: Negative for depression and substance abuse. The patient is nervous/anxious.   All other systems reviewed and are negative.    Physical Exam: BP 110/70   Pulse 92   Temp (!) 97.4 F (  36.3 C)   Ht 5\' 2"  (1.575 m)   Wt 168 lb (76.2 kg)   SpO2 95%   BMI 30.73 kg/m  Wt Readings from Last 3 Encounters:  09/12/17 168 lb (76.2 kg)  05/18/17 164 lb 9.6 oz (74.7 kg)  02/20/17 163 lb (73.9 kg)   General Appearance: Well nourished, in no apparent distress. Eyes: PERRLA, EOMs, conjunctiva no swelling or erythema Sinuses: No Frontal/maxillary tenderness ENT/Mouth: Ext aud canals clear, TMs without erythema, bulging. No erythema, swelling, or exudate on post pharynx.  Tonsils not swollen or erythematous. Hearing normal.  Neck: Supple, thyroid normal.  Respiratory: Respiratory effort normal, BS equal bilaterally without rales, rhonchi, wheezing or stridor.  Cardio: RRR with no MRGs. Brisk peripheral pulses without  edema.  Abdomen: Soft, + BS.  Non tender, no guarding, rebound, hernias, masses. Lymphatics: Non tender without lymphadenopathy.  Musculoskeletal: Full ROM, 5/5 strength, Normal gait Skin: Warm, dry without rashes, lesions, ecchymosis.  Neuro: Cranial nerves intact. No cerebellar symptoms.  Psych: Awake and oriented X 3, normal affect, Insight and Judgment appropriate.    Izora Ribas, NP 9:11 AM [facility]

## 2017-09-12 ENCOUNTER — Encounter: Payer: Self-pay | Admitting: Adult Health

## 2017-09-12 ENCOUNTER — Ambulatory Visit (INDEPENDENT_AMBULATORY_CARE_PROVIDER_SITE_OTHER): Payer: Commercial Managed Care - PPO | Admitting: Adult Health

## 2017-09-12 VITALS — BP 110/70 | HR 92 | Temp 97.4°F | Ht 62.0 in | Wt 168.0 lb

## 2017-09-12 DIAGNOSIS — I10 Essential (primary) hypertension: Secondary | ICD-10-CM | POA: Diagnosis not present

## 2017-09-12 DIAGNOSIS — E669 Obesity, unspecified: Secondary | ICD-10-CM | POA: Diagnosis not present

## 2017-09-12 DIAGNOSIS — F329 Major depressive disorder, single episode, unspecified: Secondary | ICD-10-CM | POA: Diagnosis not present

## 2017-09-12 DIAGNOSIS — F32A Depression, unspecified: Secondary | ICD-10-CM

## 2017-09-12 DIAGNOSIS — Z79899 Other long term (current) drug therapy: Secondary | ICD-10-CM

## 2017-09-12 DIAGNOSIS — R7309 Other abnormal glucose: Secondary | ICD-10-CM | POA: Diagnosis not present

## 2017-09-12 DIAGNOSIS — K219 Gastro-esophageal reflux disease without esophagitis: Secondary | ICD-10-CM

## 2017-09-12 DIAGNOSIS — E782 Mixed hyperlipidemia: Secondary | ICD-10-CM

## 2017-09-12 DIAGNOSIS — E559 Vitamin D deficiency, unspecified: Secondary | ICD-10-CM

## 2017-09-12 NOTE — Patient Instructions (Signed)
Aim for 7+ servings of fruits and vegetables daily  80+ fluid ounces of water or unsweet tea for healthy kidneys  Limit alcohol intake  Limit animal fats in diet for cholesterol and heart health - choose grass fed whenever available  Aim for low stress - take time to unwind and care for your mental health  Aim for 150 min of moderate intensity exercise weekly for heart health, and weights twice weekly for bone health  Aim for 7-9 hours of sleep daily    Drink 1/2 your body weight in fluid ounces of water daily; drink a tall glass of water 30 min before meals  Don't eat until you're stuffed- listen to your stomach and eat until you are 80% full   Try eating off of a salad plate; wait 10 min after finishing before going back for seconds  Start by eating the vegetables on your plate; aim for 50% of your meals to be fruits or vegetables  Then eat your protein - lean meats (grass fed if possible), fish, beans, nuts in moderation  Eat your carbs/starch last ONLY if you still are hungry. If you can, stop before finishing it all  Avoid sugar and flour - the closer it looks to it's original form in nature, typically the better it is for you  Splurge in moderation - "assign" days when you get to splurge and have the "bad stuff" - I like to follow a 80% - 20% plan- "good" choices 80 % of the time, "bad" choices in moderation 20% of the time  Simple equation is: Calories out > calories in = weight loss - even if you eat the bad stuff, if you limit portions, you will still lose weight       When it comes to diets, agreement about the perfect plan isn't easy to find, even among the experts. Experts at the [employer] developed an idea known as the Healthy Eating Plate. Just imagine a plate divided into logical, healthy portions.  The emphasis is on diet quality:  Load up on vegetables and fruits - one-half of your plate: Aim for color and variety, and remember that  potatoes don't count.  Go for whole grains - one-quarter of your plate: Whole wheat, barley, wheat berries, quinoa, oats, brown rice, and foods made with them. If you want pasta, go with whole wheat pasta.  Protein power - one-quarter of your plate: Fish, chicken, beans, and nuts are all healthy, versatile protein sources. Limit red meat.  The diet, however, does go beyond the plate, offering a few other suggestions.  Use healthy plant oils, such as olive, canola, soy, corn, sunflower and peanut. Check the labels, and avoid partially hydrogenated oil, which have unhealthy trans fats.  If you're thirsty, drink water. Coffee and tea are good in moderation, but skip sugary drinks and limit milk and dairy products to one or two daily servings.  The type of carbohydrate in the diet is more important than the amount. Some sources of carbohydrates, such as vegetables, fruits, whole grains, and beans-are healthier than others.  Finally, stay active.   8 Critical Weight-Loss Tips That Aren't Diet and Exercise  1. STARVE THE DISTRACTIONS  All too often when we eat, we're also multitasking: watching TV, answering emails, scrolling through social media. These habits are detrimental to having a strong, clear, healthy relationship with food, and they can hinder our ability to make dietary changes.  In order to truly focus on what  you're eating, how much you're eating, why you're eating those specific foods and, most importantly, how those foods make you feel, you need to starve the distractions. That means when you eat, just eat. Focus on your food, the process it went through to end up on your plate, where it came from and how it nourishes you. With this technique, you're more likely to finish a meal feeling satiated.  2.  CONSIDER WHAT YOU'RE NOT WILLING TO DO  This might sound counterintuitive, but it can help provide a "why" when motivation is waning. Declare, in writing, what you are unwilling to  do, for example "I am unwilling to be the old dad who cannot play sports with my children".  So consider what you're not willing to accept, write it down, and keep it at the ready.  3.  STOP LABELING FOOD "GOOD" AND "BAD"  You've probably heard someone say they ate something "bad." Maybe you've even said it yourself.  The trouble with 'bad' foods isn't that they'll send you to the grave after a bite or two. The trouble comes when we eat excessive portions of really calorie-dense foods meal after meal, day after day.  Instead of labeling foods as good or bad, think about which foods you can eat a lot of, and which ones you should just eat a little of. Then, plan ways to eat the foods you really like in portions that fit with your overall goals. A good example of this would be having a slice of pizza alongside a club salad with chicken breast, avocado and a bit of dressing. This is vastly different than 3 slices of pizza, 4 breadsticks with cheese sauce and half of a liter of regular soda.  4.  BRUSH YOUR TEETH AFTER YOU EAT  Getting your mindset in order is important, but sometimes small habits can make a big difference. After eating, you still have the taste of food in their mouth, which often causes people to eat more even if they are full or engage in a nibble or two of dessert.  Brushing your teeth will remove the taste of food from your mouth, and the clean, minty freshness will serve as a cue that mealtime is over.  5.  FOCUS ON CROWDING NOT CUTTING  The most common first step during 'dieting' is to cut. We cut our portion sizes down, we cut out 'bad' foods, we cut out entire food groups. This act of cutting puts [place] and our minds into scarcity mode.  When something is off-limits, even if you're able to avoid it for a while, you could end up bingeing on it later because you've gone so long without it. So, instead of cutting, focus on crowding. If you crowd your plate and fill it up with more  foods like veggies and protein, it simply allows less room for the other stuff. In other words, shift your focus away from what you can't eat, and celebrate the foods that will help you reach your goals.  6.  TAKE TRACKING A STEP FURTHER  Track what you eat, when you ate it, how much you ate and how that food made you feel. Being completely honest with yourself and writing down every single thing that passes through your lips will help you start to notice that maybe you actually do snack, possibly take in more sugar than you thought, eat when you're bored rather than just hungry or maybe that you have a habit of snacking before bed  while watching TV.  The difference from simply tracking your food intake is you're taking into account how food makes you feel, as well as what you're doing while you're eating. This is about becoming more mindful of what, when and why you eat.  7.  PRIORITIZE GOOD SLEEP  One of the strongest risk factors for being overweight is poor sleep. When you're feeling tired, you're more likely to choose unhealthy comfort foods and to skip your workout. Additionally, sleep deprivation may slow down your metabolism. [employer]! Therefore, sleeping 7-8 hours per night can help with weight loss without having to change your diet or increase your physical activity. And if you feel you snore and still wake up tired, talk with me about sleep apnea.  8.  SET ASIDE TIME TO DISCONNECT  Just get out there. Disconnect from the electronics and connect to the elements. Not only will this help reduce stress (a major factor in weight gain) by giving your mind a break from the constant stimulation we've all become so accustomed to, but it may also reprogram your brain to connect with yourself and what you're feeling.

## 2017-09-13 LAB — COMPLETE METABOLIC PANEL WITH GFR
AG Ratio: 1.7 (calc) (ref 1.0–2.5)
ALKALINE PHOSPHATASE (APISO): 146 U/L — AB (ref 33–130)
ALT: 12 U/L (ref 6–29)
AST: 15 U/L (ref 10–35)
Albumin: 4.4 g/dL (ref 3.6–5.1)
BILIRUBIN TOTAL: 0.4 mg/dL (ref 0.2–1.2)
BUN: 8 mg/dL (ref 7–25)
CHLORIDE: 100 mmol/L (ref 98–110)
CO2: 30 mmol/L (ref 20–32)
Calcium: 9.1 mg/dL (ref 8.6–10.4)
Creat: 1.01 mg/dL (ref 0.50–1.05)
GFR, Est African American: 73 mL/min/{1.73_m2} (ref >=60)
GFR, Est Non African American: 63 mL/min/{1.73_m2} (ref >=60)
Globulin: 2.6 g/dL (calc) (ref 1.9–3.7)
Glucose, Bld: 91 mg/dL (ref 65–99)
Potassium: 4.2 mmol/L (ref 3.5–5.3)
Sodium: 138 mmol/L (ref 135–146)
Total Protein: 7 g/dL (ref 6.1–8.1)

## 2017-09-13 LAB — CBC WITH DIFFERENTIAL/PLATELET
Basophils Absolute: 48 cells/uL (ref 0–200)
Basophils Relative: 0.8 %
Eosinophils Absolute: 78 cells/uL (ref 15–500)
Eosinophils Relative: 1.3 %
HCT: 36.7 % (ref 35.0–45.0)
Hemoglobin: 12.1 g/dL (ref 11.7–15.5)
Lymphs Abs: 984 cells/uL (ref 850–3900)
MCH: 30.5 pg (ref 27.0–33.0)
MCHC: 33 g/dL (ref 32.0–36.0)
MCV: 92.4 fL (ref 80.0–100.0)
MPV: 10.8 fL (ref 7.5–12.5)
Monocytes Relative: 7.8 %
NEUTROS PCT: 73.7 %
Neutro Abs: 4422 cells/uL (ref 1500–7800)
Platelets: 223 10*3/uL (ref 140–400)
RBC: 3.97 10*6/uL (ref 3.80–5.10)
RDW: 12.2 % (ref 11.0–15.0)
TOTAL LYMPHOCYTE: 16.4 %
WBC: 6 10*3/uL (ref 3.8–10.8)
WBCMIX: 468 {cells}/uL (ref 200–950)

## 2017-09-13 LAB — MAGNESIUM: Magnesium: 2.1 mg/dL (ref 1.5–2.5)

## 2017-09-13 LAB — LIPID PANEL
Cholesterol: 170 mg/dL (ref <200)
HDL: 51 mg/dL (ref >50)
LDL CHOLESTEROL (CALC): 95 mg/dL
NON-HDL CHOLESTEROL (CALC): 119 mg/dL (ref <130)
Total CHOL/HDL Ratio: 3.3 (calc) (ref <5.0)
Triglycerides: 138 mg/dL (ref <150)

## 2017-09-13 LAB — TSH: TSH: 4.8 mIU/L — ABNORMAL HIGH

## 2017-09-15 ENCOUNTER — Encounter: Payer: Self-pay | Admitting: Internal Medicine

## 2017-09-15 ENCOUNTER — Ambulatory Visit (INDEPENDENT_AMBULATORY_CARE_PROVIDER_SITE_OTHER): Payer: Commercial Managed Care - PPO | Admitting: Internal Medicine

## 2017-09-15 VITALS — BP 114/78 | HR 105 | Ht 62.0 in | Wt 169.0 lb

## 2017-09-15 DIAGNOSIS — R05 Cough: Secondary | ICD-10-CM | POA: Diagnosis not present

## 2017-09-15 DIAGNOSIS — R053 Chronic cough: Secondary | ICD-10-CM

## 2017-09-15 DIAGNOSIS — R059 Cough, unspecified: Secondary | ICD-10-CM

## 2017-09-15 DIAGNOSIS — J387 Other diseases of larynx: Secondary | ICD-10-CM

## 2017-09-15 DIAGNOSIS — Z77090 Contact with and (suspected) exposure to asbestos: Secondary | ICD-10-CM | POA: Diagnosis not present

## 2017-09-15 LAB — NITRIC OXIDE: NITRIC OXIDE: 13

## 2017-09-15 NOTE — Progress Notes (Signed)
Subjective:    Patient ID: Erin Nichols, female    DOB: [birth year], 52 y.o.   MRN: [id number]  PCP Erin Pinto, MD]   HPI  IOV 09/15/2017  Chief Complaint  Patient presents with  . Consult    Self referral for cough. Pt states she has had the cough x20 years and nothing has helped it. Pt states her cough is sometimes productive with clear mucus but cough is also a dry cough at times, does become SOB if overdoing things. Denies any CP/ chest tightness.    Erin Nichols , 52 y.o. , with dob 1965/12/20 and female ,Not Hispanic or Latino from [address] [place] [address] - presents to lung  clinic for cough chroonic. She presents with her mother-in-law. 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 She reports insidious onset of cough for over 20 years it is moderate to severe. According to the motherin law actually severe.the cough is present day and night. She does bring out white sputum with this. It is associated with constant clearing of the throat and also feeling of waterfall in the back of the throat and sinus drainage. Cough is present both day and night. Drinking water can help the cough. Talking can trigger the cough but otherwise no aggravating or relieving factors.There is no radiation. This no associated chest pain or orthopnea. The might be some wheezing. She's never seen a pulmonologist for the cough but has seen allergist in [place] and apparently allergy test was essentially negative except for mild tree reaction. Recently she is seen ENT Dr. Ernesto Nichols was seen inflamed vocal cords and is recommended acid reflux therapy. In July 2019 she had a chest x-ray that is clear. I personally visualized this. Medication review shows she is on ACE inhibitor for 5 years and fish oil for 5 years but this is not aggravated the cough. She is on acid reflux therapy but this has not helped the cough. The ACE  inhibitor's for hypertension as per review of the past medical record and by the mother-in-law. Mother-in-law also reports asbestos exposure on the patient when she was a child. Her father used to work in the [employer] in [place] and used to come home with asbestos on his body. RSI cough score is significantly elevated suggesting irritable larynx syndrome. Blood work shows normal chemistriesand CBC July 2019.     Dr Erin Nichols Reflux Symptom Index (> 13-15 suggestive of LPR cough)  09/15/2017 feno 13 ppb  Hoarseness of problem with voice 0  Clearing  Of Throat 5  Excess throat mucus or feeling of post nasal drip 5  Difficulty swallowing food, liquid or tablets 0  Cough after eating or lying down 3  Breathing difficulties or choking episodes 5  Troublesome or annoying cough 5  Sensation of something sticking in throat or lump in throat 5  Heartburn, chest pain, indigestion, or stomach acid coming up 0  TOTAL 28       has a past medical history of Allergy, Asthma, GERD (gastroesophageal reflux disease) (2005), 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   reports that she has never smoked. She has never used smokeless tobacco.  Past Surgical History:  Procedure Laterality Date  . COLONOSCOPY N/A 2015   screening at age 32 - Negative  . FRACTURE SURGERY  1988   facial Fx , nose, Ankle  both Arms  . l ulnar nonunion  1992  . NASAL SEPTUM SURGERY  1991  . nonunion 5th metatarsal  2008  . r sub talar fusion  2008  . redo subtalar fusion  2013  . rt ankle surg for non union  2013  . SPINE SURGERY  2011   Cx5 - Cx6 fusion w/plate/screws  . TONSILLECTOMY AND ADENOIDECTOMY  1972    No Known Allergies  Immunization History  Administered Date(s) Administered  . Influenza Inj Mdck Quad With Preservative 11/08/2016  . PPD Test 11/08/2016  . Pneumococcal-Unspecified 02/22/2015  . Td 02/22/2012  . Zoster  02/22/2015    Family History  Problem Relation Age of Onset  . Diabetes Mother   . Heart disease Father   . Hypertension Brother   . Heart disease Brother   . Stroke Maternal Grandmother      Current Outpatient Medications:  .  ALPRAZolam (XANAX) 0.5 MG tablet, TAKE 1/2 TO 1 TABLET BY MOUTH EVERY DAY AS NEEDED FOR ANIXETY(PLEASE LIMIT TO 5 DAYS A WEEK TO AVOID ADDICTION), Disp: 30 tablet, Rfl: 0 .  aspirin 81 MG chewable tablet, Chew by mouth daily., Disp: , Rfl:  .  atorvastatin (LIPITOR) 20 MG tablet, Take 1 tablet (20 mg total) by mouth daily., Disp: 90 tablet, Rfl: 1 .  benzonatate (TESSALON) 200 MG capsule, Take 1 perle 3 x / day to prevent cough, Disp: 30 capsule, Rfl: 1 .  buPROPion (WELLBUTRIN XL) 300 MG 24 hr tablet, Take 1 tablet (300 mg total) by mouth every morning., Disp: 90 tablet, Rfl: 1 .  calcium citrate (CALCITRATE - DOSED IN MG ELEMENTAL CALCIUM) 950 MG tablet, Take 200 mg of elemental calcium by mouth daily., Disp: , Rfl:  .  citalopram (CELEXA) 20 MG tablet, Take 1 tablet (20 mg total) by mouth daily., Disp: 90 tablet, Rfl: 1 .  CRANBERRY EXTRACT PO, Take 1 capsule by mouth daily., Disp: , Rfl:  .  cyclobenzaprine (FLEXERIL) 10 MG tablet, TAKE 1/2 TO 1 TABLET BY MOUTH THREE TIMES DAILY AS NEEDED, Disp: 90 tablet, Rfl: 0 .  DULERA 100-5 MCG/ACT AERO, INL 2 PFS PO BID, Disp: 39 g, Rfl: 1 .  esomeprazole (NEXIUM) 40 MG capsule, Take 40 mg by mouth daily at 12 noon., Disp: , Rfl:  .  Fexofenadine HCl (ALLEGRA PO), Take by mouth., Disp: , Rfl:  .  ibuprofen (ADVIL,MOTRIN) 600 MG tablet, TAKE 1 TABLET BY MOUTH EVERY 6 HOURS AS NEEDED, Disp: 30 tablet, Rfl: 0 .  lisinopril (PRINIVIL,ZESTRIL) 20 MG tablet, TAKE 1 TABLET BY MOUTH EVERY DAY, Disp: 90 tablet, Rfl: 1 .  montelukast (SINGULAIR) 10 MG tablet, TK 1 T PO ONCE A DAY, Disp: 90 tablet, Rfl: 1 .  Omega-3 Fatty Acids (OMEGA-3 CF PO), Take 1 capsule by mouth daily., Disp: , Rfl:  .  Prenatal Vit-Fe Fumarate-FA (M-VIT  PO), Take 1 tablet by mouth daily., Disp: , Rfl:  .  PROAIR HFA 108 (90 Base) MCG/ACT inhaler, INHALE 1 PUFF PO Q 4 H, Disp: 60 g, Rfl: 1 .  vitamin B-12 (CYANOCOBALAMIN) 100 MCG tablet, Take 100 mcg by mouth 2 (two) times daily., Disp: , Rfl:  .  Vitamin D, Ergocalciferol, (DRISDOL) 50000 units CAPS capsule, TK ONE C PO ONCE A WEEK, Disp: 30 capsule, Rfl: 1    Review of Systems  Constitutional: Positive for unexpected weight change. Negative for fever.  HENT: Positive for hearing loss, postnasal drip, sinus pressure, sneezing and sore throat. Negative for  congestion, dental problem, ear pain, nosebleeds, rhinorrhea and trouble swallowing.   Eyes: Negative for redness and itching.  Respiratory: Positive for cough and shortness of breath. Negative for chest tightness and wheezing.   Cardiovascular: Negative for palpitations and leg swelling.  Gastrointestinal: Negative for nausea and vomiting.  Genitourinary: Negative for dysuria.  Musculoskeletal: Positive for joint swelling.  Skin: Negative for rash.  Allergic/Immunologic: Negative.  Negative for environmental allergies, food allergies and immunocompromised state.  Neurological: Positive for headaches.  Hematological: Bruises/bleeds easily.  Psychiatric/Behavioral: Negative for dysphoric mood. The patient is nervous/anxious.        Objective:   Physical Exam  Constitutional: She is oriented to person, place, and time. She appears well-developed and well-nourished. No distress.  HENT:  Head: Normocephalic and atraumatic.  Right Ear: External ear normal.  Left Ear: External ear normal.  Mouth/Throat: Oropharynx is clear and moist. No oropharyngeal exudate.  Clears throast occ insp wheeze faint heard during history taking  Eyes: Pupils are equal, round, and reactive to light. Conjunctivae and EOM are normal. Right eye exhibits no discharge. Left eye exhibits no discharge. No scleral icterus.  Neck: Normal range of motion. Neck  supple. No JVD present. No tracheal deviation present. No thyromegaly present.  Cardiovascular: Normal rate, regular rhythm, normal heart sounds and intact distal pulses. Exam reveals no gallop and no friction rub.  No murmur heard. Pulmonary/Chest: Effort normal and breath sounds normal. No respiratory distress. She has no wheezes. She has no rales. She exhibits no tenderness.  Abdominal: Soft. Bowel sounds are normal. She exhibits no distension and no mass. There is no tenderness. There is no rebound and no guarding.  Musculoskeletal: Normal range of motion. She exhibits no edema or tenderness.  Lymphadenopathy:    She has no cervical adenopathy.  Neurological: She is alert and oriented to person, place, and time. She has normal reflexes. No cranial nerve deficit. She exhibits normal muscle tone. Coordination normal.  Skin: Skin is warm and dry. No rash noted. She is not diaphoretic. No erythema. No pallor.  Psychiatric: She has a normal mood and affect. Her behavior is normal. Judgment and thought content normal.  Vitals reviewed.   Today's Vitals   09/15/17 0933  BP: 114/78  Pulse: (!) 105  SpO2: 98%    Estimated body mass index is 30.73 kg/m as calculated from the following:   Height as of 09/12/17: 5\' 2"  (1.575 m).   Weight as of 09/12/17: 168 lb (76.2 kg).       Assessment & Plan:     ICD-10-CM   1. Chronic cough R05   2. Irritable larynx syndrome J38.7   3. Contact with and (suspected) exposure to asbestos Z77.090      Cough is from   Possible acid reflux and also lisinopril and fish oil All of this is working together to cause cyclical cough/LPR cough or cough neuropathy or irritable larynx No evidence of asthma   #Possible Acid Reflux  -continue nexium -  1 capsule daily on empty stomach    -avoid colas, spices, cheeses, spirits, red meats, beer, chocolates, fried foods etc.,   - continue sleep with head end of bed elevated  - eat small frequent meals  - do not  go to bed for 3 hours after last meal   #Cyclical cough  - please choose 2-3 days and observe complete voice rest - no talking or whispering  - at all times there  there is urge to cough, drink water  or swallow or sip on throat lozenge  - refer voice rehab Mr Valentino Saxon - re gabapentin  O- I will check with a pharmacist for interactions with other medicines and if safe call it in next week  #rule out ILD  - get HRCT due to cough and prior asbestos exposure - do anytime next few to several weeks  #Meds  - stop fish oil - stop lisinopril - monitor daily BP    Dr. Brand Males, M.D., [facility].C.P Pulmonary and Critical Care Medicine Staff Physician, [place] Director - Interstitial Lung Disease  Program  Pulmonary [place] [facility] [place], [place], [address]  Pager: [phone], If no answer or between  15:00h - 7:00h: call [phone] Telephone: [phone]

## 2017-09-15 NOTE — Patient Instructions (Addendum)
ICD-10-CM   1. Chronic cough R05   2. Irritable larynx syndrome J38.7   3. Contact with and (suspected) exposure to asbestos Z77.090     Cough is from   Possible acid reflux and also lisinopril and fish oil All of this is working together to cause cyclical cough/LPR cough or cough neuropathy or irritable larynx No evidence of asthma   #Possible Acid Reflux  -continue nexium -  1 capsule daily on empty stomach    -avoid colas, spices, cheeses, spirits, red meats, beer, chocolates, fried foods etc.,   - continue sleep with head end of bed elevated  - eat small frequent meals  - do not go to bed for 3 hours after last meal   #Cyclical cough  - please choose 2-3 days and observe complete voice rest - no talking or whispering  - at all times there  there is urge to cough, drink water or swallow or sip on throat lozenge  - refer voice rehab Mr Valentino Saxon - re gabapentin  O- I will check with a pharmacist for interactions with other medicines and if safe call it in next week  #rule out ILD  - get HRCT due to cough and prior asbestos exposure - do anytime next few to several weeks  #Meds  - stop fish oil - stop lisinopril - monitor daily BP  #Followup - I will see you in 6 weeks. - cough score at followup - any problems call or come sooner

## 2017-09-15 NOTE — Addendum Note (Signed)
Addended by: Lorretta Harp on: 09/15/2017 10:28 AM   Modules accepted: Orders

## 2017-09-19 ENCOUNTER — Ambulatory Visit (INDEPENDENT_AMBULATORY_CARE_PROVIDER_SITE_OTHER)
Admission: RE | Admit: 2017-09-19 | Discharge: 2017-09-19 | Disposition: A | Discharge status: Home / Self Care | Payer: Commercial Managed Care - PPO | Source: Ambulatory Visit | Attending: Internal Medicine | Admitting: Internal Medicine

## 2017-09-19 DIAGNOSIS — R059 Cough, unspecified: Secondary | ICD-10-CM

## 2017-09-19 DIAGNOSIS — R05 Cough: Secondary | ICD-10-CM

## 2017-09-19 DIAGNOSIS — R0602 Shortness of breath: Secondary | ICD-10-CM | POA: Diagnosis not present

## 2017-09-25 ENCOUNTER — Telehealth: Payer: Self-pay | Admitting: Internal Medicine

## 2017-09-25 NOTE — Telephone Encounter (Signed)
Attempted to contact pt. I did not receive an answer. There was no option for me to leave a message. Will try back.  

## 2017-09-26 NOTE — Telephone Encounter (Signed)
Called spoke with patient who reported that MR was to call pt's pharmacist to see if there are any interactions between her current medications and Gabapentin.  Patient has not heard anything from our office.  Apologized to patient, advised will call pharmacy for her [place] to speak with pharmacist - was on hold x8 mins, then line routed me back to the main menu >> total call time = 59minutes. Will route back to triage to follow up this afternoon.  Per the 7.26.19 office visit with MR: #Cyclical cough  - please choose 2-3 days and observe complete voice rest - no talking or whispering  - at all times there  there is urge to cough, drink water or swallow or sip on throat lozenge  - refer voice rehab Mr Valentino Saxon - re gabapentin  O- I will check with a pharmacist for interactions with other medicines and if safe call it in next week

## 2017-09-27 ENCOUNTER — Other Ambulatory Visit: Payer: Self-pay | Admitting: Internal Medicine

## 2017-09-27 ENCOUNTER — Encounter: Payer: Self-pay | Admitting: *Deleted

## 2017-09-27 MED ORDER — GABAPENTIN 300 MG PO CAPS: ORAL_CAPSULE | ORAL | 3 refills | Status: DC

## 2017-09-27 NOTE — Telephone Encounter (Signed)
My apologies for dropping several business days on this. Due to ICU rotation and being swamped I have been slow. Please apologize. ALison Masters said no contraindications or drug itneractions. So  Take gabapentin 300mg  once daily x 5 days, then 300mg  twice daily x 5 days, then 300mg  three times daily to continue. If this makes you too sleepy or drowsy call us and we will cut your medication dosing down

## 2017-09-27 NOTE — Telephone Encounter (Signed)
Called spoke with patient and discussed MR's recommendations as stated below.   Patient voiced her understanding and denied any questions/concerns at this time. Rx sent to verified pharmacy. Copy of MR's instructions mailed to patient as well via [place].  Nothing further needed at this time; will sign off.

## 2017-09-28 ENCOUNTER — Ambulatory Visit: Payer: Commercial Managed Care - PPO | Attending: Internal Medicine | Admitting: Speech Pathology

## 2017-09-28 ENCOUNTER — Other Ambulatory Visit: Payer: Self-pay

## 2017-09-28 ENCOUNTER — Encounter: Payer: Self-pay | Admitting: Speech Pathology

## 2017-09-28 DIAGNOSIS — R498 Other voice and resonance disorders: Secondary | ICD-10-CM

## 2017-09-28 NOTE — Therapy (Signed)
[facility] [address] [place], [place], [address] Phone: [phone]   Fax:  [phone]  Speech Language Pathology Evaluation  Patient Details  Name: Erin Nichols MRN: [id number] Date of Birth: 09-18-1965 Referring Provider: Dr. Brand Males   Encounter Date: 09/28/2017  End of Session - 09/28/17 1422    Visit Number  1    Number of Visits  17    Date for SLP Re-Evaluation  11/24/17    Authorization Type   I initally scheduled for 2x a week for 6 weeks, add on 2 weeks if indicated per POC    SLP Start Time  1320    SLP Stop Time   1400    SLP Time Calculation (min)  40 min    Activity Tolerance  Patient tolerated treatment well       Past Medical History:  Diagnosis Date  . Allergy   . Asthma   . GERD (gastroesophageal reflux disease) 2005  . 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 Hyperlipidemia 2012  . Hypertension 2012  . Vitamin D deficiency     Past Surgical History:  Procedure Laterality Date  . COLONOSCOPY N/A 2015   screening at age 8 - Negative  . FRACTURE SURGERY  1988   facial Fx , nose, Ankle both Arms  . l ulnar nonunion  1992  . NASAL SEPTUM SURGERY  1991  . nonunion 5th metatarsal  2008  . r sub talar fusion  2008  . redo subtalar fusion  2013  . rt ankle surg for non union  2013  . SPINE SURGERY  2011   Cx5 - Cx6 fusion w/plate/screws  . TONSILLECTOMY AND ADENOIDECTOMY  1972    There were no vitals filed for this visit.  Subjective Assessment - 09/28/17 1341    Subjective  "I did think it was weird to go to ST for a cough"    Patient is accompained by:  Family member   mother in law   Currently in Pain?  No/denies         SLP Evaluation [facility] - 09/28/17 1341      SLP Visit Information   SLP Received On  09/28/17    Referring Provider  Dr. Brand Males    Onset Date  20 years ago    Medical Diagnosis  Irritable larynx syndrome      Subjective   Patient/Family Stated Goal  "Stop coughing"      General Information   HPI  presents to lung  clinic for cough chronic. She presents with her mother-in-law. 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 She reports insidious onset of cough for over 20 years it is moderate to severe. According to the motherin law actually severe.the cough is present day and night. She does bring out white sputum with this. It is associated with constant clearing of the throat and also feeling of waterfall in the back of the throat and sinus drainage. Cough is present both day and night. Drinking water can help the cough. Talking can trigger the cough but otherwise no aggravating or relieving factors.There is no radiation. This no associated chest pain or orthopnea. The might be some wheezing. She's never seen a pulmonologist for the cough but has seen allergist in [place] and apparently allergy test was essentially negative except for mild tree reaction. Recently she is seen ENT Dr.  Ernesto Rutherford was seen inflamed vocal cords and is recommended acid reflux therapy. In July 2019 she had a chest x-ray that is clear.Mar[place] Kitchen RSI cough score is significantly elevated suggesting irritable larynx syndrome.  Cough is from   Possible acid reflux and also lisinopril and fish oil. Pt has started gabapentin, completed her 3 days complete voice rest, and has folllowed recommendations of not eating after dinner. She does have her bed on blocks and is using a 10" wedge pillow for reflux. Pt reports eliminating energy drinks as well.     Behavioral/Cognition  WFL    Mobility Status  walks independently      Balance Screen   Has the patient fallen in the past 6 months  No    Has the patient had a decrease in activity level because of a fear of falling?   No    Is the patient reluctant to leave their home because of a fear of falling?   No      Prior Functional Status    Cognitive/Linguistic Baseline  Within functional limits    Type of Home  House     Lives With  Spouse    Available Support  Family    Vocation  Student      Oral Motor/Sensory Function   Overall Oral Motor/Sensory Function  Appears within functional limits for tasks assessed      Motor Speech   Overall Motor Speech  Other (comment)   Vocal cord dysfuntion   Respiration  Within functional limits    Phonation  Hoarse    Resonance  Within functional limits    Articulation  Within functional limitis    Intelligibility  Intelligible    Motor Planning  Witnin functional limits    Motor Speech Errors  Not applicable    Phonation  Impaired    Vocal Abuses  Habitual Cough/Throat Clear   LPR   Tension Present  Neck    Volume  Appropriate    Pitch  Appropriate                      SLP Education - 09/28/17 1421    Education Details  VCD education; LPR education; abdominal breathing with sniff/blow; relaxation    Person(s) Educated  Patient;Parent(s)   mother in law   Methods  Explanation;Demonstration;Verbal cues;Handout    Comprehension  Verbalized understanding;Verbal cues required;Need further instruction       SLP Short Term Goals - 09/28/17 1427      SLP SHORT TERM GOAL #1   Title  Pt will converse for 8 minutes with abdominal breathing with occasional min A over 2 sessions    Time  4    Period  Weeks    Status  New      SLP SHORT TERM GOAL #2   Title  Pt will report successful carryover of relaxation techniques outside of therapy over 3 sessions    Time  4    Period  Weeks    Status  New      SLP SHORT TERM GOAL #3   Title  Pt will report decreased frequency of VCD symptoms outside of therapy by 25% subjectively    Time  4    Period  Weeks    Status  New       SLP Long Term Goals - 09/28/17 1430      SLP LONG TERM GOAL #1   Title  Pt will report/demonstrate relaxation strategies outside  of ST over 6 sessions    Time  8    Period  Weeks     Status  New      SLP LONG TERM GOAL #2   Title  Pt will converse for 12 minute utilizing abdominal breathing over 2 sessions with rare min A    Time  8    Period  Weeks    Status  New      SLP LONG TERM GOAL #3   Title  Pt will report decreased frequency of VCD symptoms outside of therapy by 50% subjectively    Time  8    Period  Weeks    Status  New       Plan - 09/28/17 1423    Clinical Impression Statement  Erin Nichols is referred by pulmonologist due to 20 year h/o of chronic cough. She is accompanied by her mother in law. Erin Nichols reports coughing episodes 4-5x each night and frerquently throughout the day, interfering with her daily activities, jobs and irritating family and friends. ENT reports LPR evidence. Pt also reports that since starting gabapentin (she is on 1x (300mg ) a day working up to 3x) cough has reduced to 1x last night and 2 episodes today. On 09/15/17, her RSI (Reflux Symptom Index) score was 28 (>13-15 suggestive of LPR cough). Dr. Chase Caller has instructed pt in LPR precautions and reduce throat clearing. Pt and SLP discussed handout of vocal cord dysfunction (VCD) in order to educate pt about VCD and for SLP to assess triggers/causes/signs and symptoms of VCD. Pt. stated that sipping water and constant swallowing as with a lozenge has helped (somewhat) her frequent coughing episodes to subside. She has been taking 300 mg neurontin for approx 1 day and has seen moderate decrease in coughing episodes throughout the day. Pt presents with hoarse voice today. Sustained vowel /a/ measured average 18 seconds (WNL),   Pt's symptoms of VCD include  frequent cough/thorat clear, feeling of tightness in throat day and night. Causes were ID'd as cold, strong odors/fumes, post-nasal drip, LPR, & voice usage. SLP assessed pt to be a chest breather in conversation. Pt had no coughing episodes and 6 throat clears during this 40 minute evaluation today. With training by SLP these  episodes should be greatly reduced or eliminated. Following eval tasks, pt was educated about abdominal breathing and how it is essential in inhibiting/eliminating symptoms of VCD. The pt will benefit from skilled ST addressing abdominal breathing (AB) and relaxation strategies, all to eliminate/reduce VCD and thus increase pt's QOL. 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 Next, SLP guided pt through a period of diagnostic therapy for AB and pt was approx 50%successful at obtaining AB at rest. This took approximately 7 minutes. Pt will likely be able to begin at phrase level with AB.         SLP Education - 06/25/14 1315    Educ               Speech Therapy Frequency  2x / week    Duration  --   8 weeeks or 17 visits   Treatment/Interventions  Cueing hierarchy;Functional tasks;Patient/family education;Environmental controls;Internal/external aids;SLP instruction and feedback;Compensatory strategies;Other (comment);Compensatory techniques   abdominal breathing; relaxation   Potential to Achieve Goals  Good    SLP Home Exercise Plan  abdominal breathing with sniff blow - see pt instructions    Consulted and Agree with  Plan of Care  Patient;Family member/caregiver    Family Member Consulted  Mother in law       Patient will benefit from skilled therapeutic intervention in order to improve the following deficits and impairments:   Other voice and resonance disorders    Problem List Patient Active Problem List   Diagnosis Date Noted  . Obesity (BMI 30.0-34.9) 02/13/2017  . Essential hypertension 11/08/2016  . Hyperlipidemia, mixed 11/08/2016  . Abnormal glucose 11/08/2016  . Vitamin D deficiency 11/08/2016  . Gastroesophageal reflux disease 11/08/2016  . Asthma due to environmental allergies 11/08/2016  . Depression, controlled 11/08/2016    Ruie Sendejo, Annye Rusk MS, CCC-SLP 09/28/2017,  2:33 PM  [facility] [address] [place], [place], [address] Phone: [phone]   Fax:  [phone]  Name: Erin Nichols MRN: [id number] Date of Birth: 07/16/1965

## 2017-09-28 NOTE — Patient Instructions (Addendum)
  Reduce throat clearing - #1 task!!! Be aware and swallow instead - or try to think of something else   ABDOMINAL BREATHING FOR VCD   . Shoulders down - this is a cue to relax . Place your hand on your abdomen - this helps you focus on easy abdominal breath support - the best and most relaxed way to breathe . Breathe in through your nose and fill your belly with air, watching your hand move outward . Breathe out through your mouth and watch your belly move in. An audible "sh"  may help   Think of your belly as a balloon, when you fill with air (inhale), the balloon gets bigger. As the air goes out (exhale), the balloon deflates.  If you are having difficulty coordinating this, lay on your back with a plastic cup on your belly and repeat the above steps, watching you belly move up with inhalation and down with exhalations  Practice breathing in and out in front of a mirror, watching your belly Breathe in for a count of 5 and breathe out for a count of 5  Now as you breathe out, get a picture of relaxing in your mind Feel the constant in-out of your breathing with your belly Picture the tension in your throat and chest evaporate like steam, melting away and FEEL it do so Picture your throat opening up so wide that a grapefruit or softball could fit through your throat.   Practice this throughout the day when you are not having symptoms. For example: in the car, when watching TV, before medications. Regular practice when you are feeling well is important.  Make it automatic and use it at the first sense of throat tightness to prevent or suppress the VCD. You may start with the inhale or exhale.   Be patient when completing the breathing. It may take several minutes to start feeling relief  Use the breathing to "pre-treat" yourself before a known trigger for VCD. Possible triggers could be: change in air temperature, strong odors, perfume, and exercise.   There's an App for that:  Breathe2relax  Provided by: Georgiann Hahn. SLP [phone]

## 2017-10-10 ENCOUNTER — Other Ambulatory Visit: Payer: Self-pay | Admitting: Internal Medicine

## 2017-10-10 ENCOUNTER — Other Ambulatory Visit: Payer: Self-pay | Admitting: Adult Health

## 2017-10-10 DIAGNOSIS — F329 Major depressive disorder, single episode, unspecified: Secondary | ICD-10-CM

## 2017-10-10 DIAGNOSIS — J45909 Unspecified asthma, uncomplicated: Secondary | ICD-10-CM

## 2017-10-10 DIAGNOSIS — F32A Depression, unspecified: Secondary | ICD-10-CM

## 2017-10-10 DIAGNOSIS — E782 Mixed hyperlipidemia: Secondary | ICD-10-CM

## 2017-10-10 DIAGNOSIS — Z79899 Other long term (current) drug therapy: Secondary | ICD-10-CM

## 2017-10-12 ENCOUNTER — Encounter: Payer: Self-pay | Admitting: Speech Pathology

## 2017-10-12 ENCOUNTER — Ambulatory Visit: Payer: Commercial Managed Care - PPO | Admitting: Speech Pathology

## 2017-10-12 DIAGNOSIS — R498 Other voice and resonance disorders: Secondary | ICD-10-CM

## 2017-10-12 NOTE — Therapy (Signed)
[facility] [address] [place], [place], [address] Phone: [phone]   Fax:  [phone]  Speech Language Pathology Treatment  Patient Details  Name: Erin Nichols MRN: [id number] Date of Birth: 09-20-1965 Referring Provider: Dr. Brand Males   Encounter Date: 10/12/2017  End of Session - 10/12/17 1158    Visit Number  2    Number of Visits  17    Date for SLP Re-Evaluation  11/24/17    Authorization Type   I initally scheduled for 2x a week for 6 weeks, add on 2 weeks if indicated per POC    SLP Start Time  1102    SLP Stop Time   1144    SLP Time Calculation (min)  42 min    Activity Tolerance  Patient tolerated treatment well       Past Medical History:  Diagnosis Date  . Allergy   . Asthma   . GERD (gastroesophageal reflux disease) 2005  . 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 Hyperlipidemia 2012  . Hypertension 2012  . Vitamin D deficiency     Past Surgical History:  Procedure Laterality Date  . COLONOSCOPY N/A 2015   screening at age 5 - Negative  . FRACTURE SURGERY  1988   facial Fx , nose, Ankle both Arms  . l ulnar nonunion  1992  . NASAL SEPTUM SURGERY  1991  . nonunion 5th metatarsal  2008  . r sub talar fusion  2008  . redo subtalar fusion  2013  . rt ankle surg for non union  2013  . SPINE SURGERY  2011   Cx5 - Cx6 fusion w/plate/screws  . TONSILLECTOMY AND ADENOIDECTOMY  1972    There were no vitals filed for this visit.  Subjective Assessment - 10/12/17 1058    Subjective  "It is wonderful - I can sleep all night without coughing"    Currently in Pain?  No/denies            ADULT SLP TREATMENT - 10/12/17 1059      General Information   Behavior/Cognition  Alert;Cooperative;Pleasant mood      Treatment Provided   Treatment provided  Cognitive-Linquistic      Pain Assessment   Pain Assessment  No/denies pain      Cognitive-Linquistic Treatment   Treatment focused on  Voice    Skilled Treatment  Pt  reports success with abdominal breathing while laying down, with difficulty completing sitting up. Trained pt in abdominal breathing beginning with [facility] exhalation with abdominal contraction like zipping up jeans then deep sniff on stomach relaxing with success at occasional to rare min A with this strategy. Initiated oral reading with abdominal sniff prior to phonation with usual min A. Pt has been without coughing episode since gabapentin increased to TID. She reports feeling a tickle in her throat prio to coughing episode - instructed pt to use deep sniff with audible expiration when she feels a cough may start and during coughing episode.       Assessment / Recommendations / Plan   Plan  Continue with current plan of care      Progression Toward Goals   Progression toward goals  Progressing toward goals       SLP Education - 10/12/17 1154    Education Details  abdominal sniff/blow to pretreat or treat chronic cough and with speech    Person(s) Educated  Patient    Methods  Explanation;Demonstration;Verbal  cues;Handout    Comprehension  Verbalized understanding;Verbal cues required;Need further instruction;Returned demonstration       SLP Short Term Goals - 10/12/17 1157      SLP SHORT TERM GOAL #1   Title  Pt will converse for 8 minutes with abdominal breathing with occasional min A over 2 sessions    Time  4    Period  Weeks    Status  On-going      SLP SHORT TERM GOAL #2   Title  Pt will report successful carryover of relaxation techniques outside of therapy over 3 sessions    Time  4    Period  Weeks    Status  On-going      SLP SHORT TERM GOAL #3   Title  Pt will report decreased frequency of VCD symptoms outside of therapy by 25% subjectively    Time  4    Period  Weeks    Status  On-going      SLP SHORT TERM GOAL #4   Status  On-going       SLP Long Term Goals - 10/12/17 1157      SLP LONG TERM GOAL #1    Title  Pt will report/demonstrate relaxation strategies outside of ST over 6 sessions    Time  8    Period  Weeks    Status  On-going      SLP LONG TERM GOAL #2   Title  Pt will converse for 12 minute utilizing abdominal breathing over 2 sessions with rare min A    Time  8    Period  Weeks    Status  On-going      SLP LONG TERM GOAL #3   Title  Pt will report decreased frequency of VCD symptoms outside of therapy by 50% subjectively    Time  8    Period  Weeks    Status  On-going       Plan - 10/12/17 1154    Clinical Impression Statement  [name] report couging reduced to 1 episode in am upon waking. 1 throat clear in session today, no couging in this session. Ongoing training for deep sniff with abdomen and audible "sh" expiration to treat or pre-treat VCD episodes with min A. Continue skilled ST to reduce VCD episodes.     Speech Therapy Frequency  2x / week    Treatment/Interventions  Cueing hierarchy;Functional tasks;Patient/family education;Environmental controls;Internal/external aids;SLP instruction and feedback;Compensatory strategies;Other (comment);Compensatory techniques    Potential to Achieve Goals  Good    SLP Home Exercise Plan  abdominal breathing with sniff blow - see pt instructions    Consulted and Agree with Plan of Care  Patient       Patient will benefit from skilled therapeutic intervention in order to improve the following deficits and impairments:   Other voice and resonance disorders    Problem List Patient Active Problem List   Diagnosis Date Noted  . Obesity (BMI 30.0-34.9) 02/13/2017  . Essential hypertension 11/08/2016  . Hyperlipidemia, mixed 11/08/2016  . Abnormal glucose 11/08/2016  . Vitamin D deficiency 11/08/2016  . Gastroesophageal reflux disease 11/08/2016  . Asthma due to environmental allergies 11/08/2016  . Depression, controlled 11/08/2016    Arnitra Sokoloski, Annye Rusk MS, CCC-SLP 10/12/2017, 11:59 AM  [facility] [address] [place], [place], [address] Phone: [phone]   Fax:  [phone]   Name: Erin Nichols MRN: [id number] Date of Birth: 09-09-1965

## 2017-10-12 NOTE — Patient Instructions (Signed)
   When you feel the tickle or tension in your throat, use a deep belly sniff with SH to let the air out and keep doing this while relaxing your neck and shoulders for several cycles before your talk  If you experience a coughing episode, try to do a deep belly sniff followed by a SH exhalation with belly going in and repeat for several cycles to reduce length of cough  Practice this breathing as much as possible   If you get confused - start with SH while sucking in to zip up your jeans, then let your belly out with a deep sniff  Deep sniff in with belly out - then read sentences and feel belly contract  Once you are in a good habit of sniffing with belly out, read 10 sentences, contracting belly in as your read and big Sniff as you relax belly at the end of the sentence  If you start to cough - Deep Sniff!! Then exhale with [facility] for 5-6 cycles or until the cough has passed

## 2017-10-13 ENCOUNTER — Other Ambulatory Visit: Payer: Self-pay | Admitting: Adult Health

## 2017-10-16 ENCOUNTER — Ambulatory Visit: Payer: Commercial Managed Care - PPO

## 2017-10-16 DIAGNOSIS — R498 Other voice and resonance disorders: Secondary | ICD-10-CM | POA: Diagnosis not present

## 2017-10-16 NOTE — Patient Instructions (Signed)
  Please complete the assigned speech therapy homework prior to your next session and return it to the speech therapist at your next visit.  

## 2017-10-16 NOTE — Therapy (Signed)
[facility] [address] [place], [place], [address] Phone: [phone]   Fax:  [phone]  Speech Language Pathology Treatment  Patient Details  Name: Erin Nichols MRN: [id number] Date of Birth: [birth year] Referring Provider: Dr. Brand Males   Encounter Date: 10/16/2017  End of Session - 10/16/17 1153    Visit Number  3    Number of Visits  17    Date for SLP Re-Evaluation  11/24/17    SLP Start Time  1105    SLP Stop Time   1145    SLP Time Calculation (min)  40 min    Activity Tolerance  Patient tolerated treatment well       Past Medical History:  Diagnosis Date  . Allergy   . Asthma   . GERD (gastroesophageal reflux disease) 2005  . 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 Hyperlipidemia 2012  . Hypertension 2012  . Vitamin D deficiency     Past Surgical History:  Procedure Laterality Date  . COLONOSCOPY N/A 2015   screening at age 40 - Negative  . FRACTURE SURGERY  1988   facial Fx , nose, Ankle both Arms  . l ulnar nonunion  1992  . NASAL SEPTUM SURGERY  1991  . nonunion 5th metatarsal  2008  . r sub talar fusion  2008  . redo subtalar fusion  2013  . rt ankle surg for non union  2013  . SPINE SURGERY  2011   Cx5 - Cx6 fusion w/plate/screws  . TONSILLECTOMY AND ADENOIDECTOMY  1972    There were no vitals filed for this visit.  Subjective Assessment - 10/16/17 1113    Subjective  I do the sniff and breathing thing, but I haven't had to since the Gabapentin."    Currently in Pain?  No/denies            ADULT SLP TREATMENT - 10/16/17 1113      General Information   Behavior/Cognition  Alert;Cooperative;Pleasant mood      Treatment Provided   Treatment provided  Cognitive-Linquistic      Cognitive-Linquistic Treatment   Treatment focused on  Voice    Skilled Treatment  "I might clear it once, or twice a day." Pt reports frequency of cough/overt s/s VCD has  subsided almost compeltely. Pt read 8-9 word sentences with AB 80% of the time. SLP then moved into spoken language and pt answered "yes" or "no" to questions with AB 80% of the time. SLP req'd pt to incr linguistic load - pt performed responsive naming tasks with 85-90% success with AB. SLP told pt to practice at least 15 minutes/day with verbal responses focusing on AB, and at least 15 minutes/day on AB at rest.  Pt reports using sniff-blow technique outside ST, but "not too much now, since I never cough."      Assessment / Recommendations / Plan   Plan  Continue with current plan of care      Progression Toward Goals   Progression toward goals  Progressing toward goals         SLP Short Term Goals - 10/16/17 1155      SLP SHORT TERM GOAL #1   Title  Pt will converse for 8 minutes with abdominal breathing with occasional min A over 2 sessions    Time  4    Period  Weeks    Status  On-going      SLP SHORT TERM  GOAL #2   Title  Pt will report successful carryover of relaxation techniques outside of therapy over 3 sessions    Baseline  10-16-17    Time  4    Period  Weeks    Status  On-going      SLP SHORT TERM GOAL #3   Title  Pt will report decreased frequency of VCD symptoms outside of therapy by 25% subjectively    Status  Achieved      SLP SHORT TERM GOAL #4   Status  On-going       SLP Long Term Goals - 10/16/17 1156      SLP LONG TERM GOAL #1   Title  Pt will report/demonstrate relaxation strategies outside of ST over 6 sessions    Baseline  10-16-17    Time  7    Period  Weeks    Status  On-going      SLP LONG TERM GOAL #2   Title  Pt will converse for 12 minute utilizing abdominal breathing over 2 sessions with rare min A    Time  7    Period  Weeks    Status  On-going      SLP LONG TERM GOAL #3   Title  Pt will report decreased frequency of VCD symptoms outside of therapy by 50% subjectively    Status  Achieved      SLP LONG TERM GOAL #4   Title  Pt will  report decreased frequency of VCD symptoms outside of therapy by 80% subjectively over 4 sessions    Baseline  10-16-17    Time  7    Period  Weeks    Status  New       Plan - 10/16/17 1153    Clinical Impression Statement  Pt without couging in this session; reports coughing is almost completely subsided. Ongoing training for abdominal breathing (AB) to treat or pre-treat VCD episodes with SBA at rest, and pt today began using AB with spontaneous utterances in structured speech tasks. Continue skilled ST to reduce VCD episodes.     Speech Therapy Frequency  2x / week    Treatment/Interventions  Cueing hierarchy;Functional tasks;Patient/family education;Environmental controls;Internal/external aids;SLP instruction and feedback;Compensatory strategies;Other (comment);Compensatory techniques    Potential to Achieve Goals  Good    SLP Home Exercise Plan  abdominal breathing with sniff blow - see pt instructions    Consulted and Agree with Plan of Care  Patient       Patient will benefit from skilled therapeutic intervention in order to improve the following deficits and impairments:   Other voice and resonance disorders    Problem List Patient Active Problem List   Diagnosis Date Noted  . Obesity (BMI 30.0-34.9) 02/13/2017  . Essential hypertension 11/08/2016  . Hyperlipidemia, mixed 11/08/2016  . Abnormal glucose 11/08/2016  . Vitamin D deficiency 11/08/2016  . Gastroesophageal reflux disease 11/08/2016  . Asthma due to environmental allergies 11/08/2016  . Depression, controlled 11/08/2016    [facility] ,[place], [place]  10/16/2017, 11:57 AM  [facility] [address] [place], [place], [address] Phone: [phone]   Fax:  [phone]   Name: Erin Nichols MRN: [id number] Date of Birth: 1965/11/13

## 2017-10-19 ENCOUNTER — Encounter: Payer: Self-pay | Admitting: Speech Pathology

## 2017-10-19 ENCOUNTER — Ambulatory Visit: Payer: Commercial Managed Care - PPO | Admitting: Speech Pathology

## 2017-10-19 DIAGNOSIS — R498 Other voice and resonance disorders: Secondary | ICD-10-CM

## 2017-10-19 NOTE — Therapy (Signed)
[facility] [address] [place], [place], [address] Phone: [phone]   Fax:  [phone]  Speech Language Pathology Treatment  Patient Details  Name: Erin Nichols MRN: [id number] Date of Birth: 1965/07/30 Referring Provider: Dr. Brand Males   Encounter Date: 10/19/2017  End of Session - 10/19/17 0934    Visit Number  4    Number of Visits  17    Date for SLP Re-Evaluation  11/24/17    Authorization Type   I initally scheduled for 2x a week for 6 weeks, add on 2 weeks if indicated per POC    SLP Start Time  0846    SLP Stop Time   0928    SLP Time Calculation (min)  42 min    Activity Tolerance  Patient tolerated treatment well       Past Medical History:  Diagnosis Date  . Allergy   . Asthma   . GERD (gastroesophageal reflux disease) 2005  . 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 Hyperlipidemia 2012  . Hypertension 2012  . Vitamin D deficiency     Past Surgical History:  Procedure Laterality Date  . COLONOSCOPY N/A 2015   screening at age 20 - Negative  . FRACTURE SURGERY  1988   facial Fx , nose, Ankle both Arms  . l ulnar nonunion  1992  . NASAL SEPTUM SURGERY  1991  . nonunion 5th metatarsal  2008  . r sub talar fusion  2008  . redo subtalar fusion  2013  . rt ankle surg for non union  2013  . SPINE SURGERY  2011   Cx5 - Cx6 fusion w/plate/screws  . TONSILLECTOMY AND ADENOIDECTOMY  1972    There were no vitals filed for this visit.  Subjective Assessment - 10/19/17 0850    Subjective  "I have a cold"    Currently in Pain?  No/denies            ADULT SLP TREATMENT - 10/19/17 0851      General Information   Behavior/Cognition  Alert;Cooperative;Pleasant mood      Treatment Provided   Treatment provided  Cognitive-Linquistic      Cognitive-Linquistic Treatment   Treatment focused on  Voice    Skilled Treatment  Pt demonstrated abdominal breathing in isolation  with supervision cues. In structured speech tasks, pt required occasional min A for nose (sniff) inspiration. Pt reports success using sniff/blow to pre-treat 2 VCD episodes, 1 after eating ice cream (temp change) and the other at bed time. Today, she demonstrated sniff/blow to prevent VCD episode vs throat clear 1x. Pt required usual verbal cues to ID 5 throat clears.       Assessment / Recommendations / Plan   Plan  Continue with current plan of care      Progression Toward Goals   Progression toward goals  Progressing toward goals       SLP Education - 10/19/17 0932    Education Details  abdominal sniff with conversation    Person(s) Educated  Patient    Methods  Explanation;Demonstration;Verbal cues;Handout    Comprehension  Verbalized understanding;Verbal cues required;Returned demonstration       SLP Short Term Goals - 10/19/17 0933      SLP SHORT TERM GOAL #1   Title  Pt will converse for 8 minutes with abdominal breathing with occasional min A over 2 sessions    Time  4    Period  Weeks    Status  On-going      SLP SHORT TERM GOAL #2   Title  Pt will report successful carryover of relaxation techniques outside of therapy over 3 sessions    Baseline  10-16-17    Time  4    Period  Weeks    Status  On-going      SLP SHORT TERM GOAL #3   Title  Pt will report decreased frequency of VCD symptoms outside of therapy by 25% subjectively    Status  Achieved      SLP SHORT TERM GOAL #4   Status  On-going       SLP Long Term Goals - 10/19/17 0933      SLP LONG TERM GOAL #1   Title  Pt will report/demonstrate relaxation strategies outside of ST over 6 sessions    Baseline  10-16-17; 10/19/17    Time  7    Period  Weeks    Status  On-going      SLP LONG TERM GOAL #2   Title  Pt will converse for 12 minute utilizing abdominal breathing over 2 sessions with rare min A    Time  7    Period  Weeks    Status  On-going      SLP LONG TERM GOAL #3   Title  Pt will report  decreased frequency of VCD symptoms outside of therapy by 50% subjectively    Status  Achieved      SLP LONG TERM GOAL #4   Title  Pt will report decreased frequency of VCD symptoms outside of therapy by 80% subjectively over 4 sessions    Baseline  10-16-17    Time  7    Period  Weeks    Status  New       Plan - 10/19/17 2703    Clinical Impression Statement  Pt without couging in this session; reports coughing is almost completely subsided. Ongoing training for abdominal breathing (AB) to treat or pre-treat VCD episodes with SBA at rest, and pt today began using AB with spontaneous utterances in structured speech tasks. Continue skilled ST to reduce VCD episodes.     Speech Therapy Frequency  2x / week    Treatment/Interventions  Cueing hierarchy;Functional tasks;Patient/family education;Environmental controls;Internal/external aids;SLP instruction and feedback;Compensatory strategies;Other (comment);Compensatory techniques    Potential to Achieve Goals  Good    SLP Home Exercise Plan  abdominal breathing with sniff blow - see pt instructions    Consulted and Agree with Plan of Care  Patient       Patient will benefit from skilled therapeutic intervention in order to improve the following deficits and impairments:   Other voice and resonance disorders    Problem List Patient Active Problem List   Diagnosis Date Noted  . Obesity (BMI 30.0-34.9) 02/13/2017  . Essential hypertension 11/08/2016  . Hyperlipidemia, mixed 11/08/2016  . Abnormal glucose 11/08/2016  . Vitamin D deficiency 11/08/2016  . Gastroesophageal reflux disease 11/08/2016  . Asthma due to environmental allergies 11/08/2016  . Depression, controlled 11/08/2016    Manu Rubey, Annye Rusk MS, CCC-SLP 10/19/2017, 9:34 AM  [facility] [address] [place], [place], [address] Phone: [phone]   Fax:  [phone]   Name: Erin Nichols MRN:  [id number] Date of Birth: [birth year]

## 2017-10-19 NOTE — Patient Instructions (Signed)
  You are doing a good job using a sniff to pre-treat your cough when you feel it coming on to prevent VCD  This week, continue practicing your sniff (nose breathing) while walking, and also while talking  Continue to reduce throat clears  If you have a coughing (VCD) episode - try to sniff and blow with your belly to stop the cough - you may have to do this several times and not talk for a few minutes  The goal is for you to use the nose belly breathing throughout the day, even in conversation

## 2017-10-24 ENCOUNTER — Ambulatory Visit: Payer: Commercial Managed Care - PPO | Attending: Internal Medicine | Admitting: Speech Pathology

## 2017-10-24 ENCOUNTER — Encounter: Payer: Self-pay | Admitting: Speech Pathology

## 2017-10-24 DIAGNOSIS — R498 Other voice and resonance disorders: Secondary | ICD-10-CM

## 2017-10-24 NOTE — Therapy (Signed)
[facility] [address] [place], [place], [address] Phone: [phone]   Fax:  [phone]  Speech Language Pathology Treatment  Patient Details  Name: Erin Nichols MRN: [id number] Date of Birth: 1965/12/09 Referring Provider: Dr. Brand Males   Encounter Date: 10/24/2017  End of Session - 10/24/17 1535    Visit Number  5    Number of Visits  17    Date for SLP Re-Evaluation  11/24/17    Authorization Type   I initally scheduled for 2x a week for 6 weeks, add on 2 weeks if indicated per POC    SLP Start Time  0846    SLP Stop Time   0930    SLP Time Calculation (min)  44 min    Activity Tolerance  Patient tolerated treatment well       Past Medical History:  Diagnosis Date  . Allergy   . Asthma   . GERD (gastroesophageal reflux disease) 2005  . 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 Hyperlipidemia 2012  . Hypertension 2012  . Vitamin D deficiency     Past Surgical History:  Procedure Laterality Date  . COLONOSCOPY N/A 2015   screening at age 85 - Negative  . FRACTURE SURGERY  1988   facial Fx , nose, Ankle both Arms  . l ulnar nonunion  1992  . NASAL SEPTUM SURGERY  1991  . nonunion 5th metatarsal  2008  . r sub talar fusion  2008  . redo subtalar fusion  2013  . rt ankle surg for non union  2013  . SPINE SURGERY  2011   Cx5 - Cx6 fusion w/plate/screws  . TONSILLECTOMY AND ADENOIDECTOMY  1972    There were no vitals filed for this visit.  Subjective Assessment - 10/24/17 0851    Subjective  "I'm sleeping through the night"    Currently in Pain?  No/denies            ADULT SLP TREATMENT - 10/24/17 0852      General Information   Behavior/Cognition  Alert;Cooperative;Pleasant mood      Treatment Provided   Treatment provided  Cognitive-Linquistic      Cognitive-Linquistic Treatment   Treatment focused on  Voice    Skilled Treatment  Pt attempted to sleep with out her  wedge without success. She reports using sniff/blow to prevent cough on several occasions, "I did it 4x (sniff/blow) and and the cough sensation went away" Pt enters room with cough drop. I asked her why she was using cough drops and she says it takes her mind off of cough. She is utilizing sniff/blow abdominal breathing in conversation with occasional min A for sniff inhalation as opposed to mouth inhalation. Pt reports 95% reduction of cough. She has not had the opportunity to use sniff/blow to stop a VCD episode as she has not had a significant coughing episode since gabapentin TID.       Assessment / Recommendations / Plan   Plan  Other (Comment)   decrease to 1x a week for 2 weeks, keep other appts. as need     Progression Toward Goals   Progression toward goals  Progressing toward goals         SLP Short Term Goals - 10/24/17 1533      SLP SHORT TERM GOAL #1   Title  Pt will converse for 8 minutes with abdominal breathing with occasional min A over 2 sessions  Baseline  10/24/17;     Time  2    Period  Weeks    Status  On-going      SLP SHORT TERM GOAL #2   Title  Pt will report successful carryover of relaxation techniques outside of therapy over 3 sessions    Baseline  10-16-17; 10/24/17    Time  2    Period  Weeks    Status  On-going      SLP SHORT TERM GOAL #3   Title  Pt will report decreased frequency of VCD symptoms outside of therapy by 25% subjectively    Status  Achieved      SLP SHORT TERM GOAL #4   Status  On-going       SLP Long Term Goals - 10/24/17 1534      SLP LONG TERM GOAL #1   Title  Pt will report/demonstrate relaxation strategies outside of ST over 6 sessions    Baseline  10-16-17; 10/19/17; 10/24/17    Time  6    Period  Weeks    Status  On-going      SLP LONG TERM GOAL #2   Title  Pt will converse for 12 minute utilizing abdominal breathing over 2 sessions with rare min A    Baseline  10/24/17;     Time  6    Period  Weeks    Status  On-going       SLP LONG TERM GOAL #3   Title  Pt will report decreased frequency of VCD symptoms outside of therapy by 50% subjectively    Status  Achieved      SLP LONG TERM GOAL #4   Title  Pt will report decreased frequency of VCD symptoms outside of therapy by 80% subjectively over 4 sessions    Baseline  10-16-17; 10/24/17;     Time  6    Period  Weeks    Status  On-going       Plan - 10/24/17 1535    Clinical Impression Statement  Pt reports 95% reduction of coughing. She is using abdominal sniff/blow to pre-treat couging to prevent VCD episode. Pt reports using mountains as relaxation outside of [place]. Pt has not had the opportunity to use abdominal sniff/blow to cease a VCD episode as her cough has been under control since initiating gabapentin. Continue skilled ST to maximize carryover of abdominal breathing in conversation. Pt to reduce to 1x a week due to success with strategies to reduce VCD.     Treatment/Interventions  Cueing hierarchy;Functional tasks;Patient/family education;Environmental controls;Internal/external aids;SLP instruction and feedback;Compensatory strategies;Other (comment);Compensatory techniques    Potential to Achieve Goals  Good    SLP Home Exercise Plan  abdominal breathing with sniff blow - see pt instructions    Consulted and Agree with Plan of Care  Patient       Patient will benefit from skilled therapeutic intervention in order to improve the following deficits and impairments:   Other voice and resonance disorders    Problem List Patient Active Problem List   Diagnosis Date Noted  . Obesity (BMI 30.0-34.9) 02/13/2017  . Essential hypertension 11/08/2016  . Hyperlipidemia, mixed 11/08/2016  . Abnormal glucose 11/08/2016  . Vitamin D deficiency 11/08/2016  . Gastroesophageal reflux disease 11/08/2016  . Asthma due to environmental allergies 11/08/2016  . Depression, controlled 11/08/2016    Aliou Mealey, Annye Rusk MS, CCC-SLP 10/24/2017, 3:37 PM  [facility] [address] [place],  [place], [address] Phone: [phone]   Fax:  [phone]   Name: KAMARIE VENO MRN: [id number] Date of Birth: 29-Dec-1965

## 2017-10-24 NOTE — Patient Instructions (Signed)
  Focus on belly sniff in conversations, rather than a mouth inhalation  Continue working on throat clears  Continue using deep belly sniff/blow to pre-treat cough when you feel it coming on - Good Job!!  Come 1x a week for 2 weeks - keep other appointments for now - we will adjust those as needed  Keep practicing belly sniff/blow adding in relaxation of eyes closed, picture the mountain scene and feel your chest, neck and throat relax and open wide

## 2017-10-25 ENCOUNTER — Other Ambulatory Visit: Payer: Self-pay | Admitting: Internal Medicine

## 2017-10-25 DIAGNOSIS — Z79899 Other long term (current) drug therapy: Secondary | ICD-10-CM

## 2017-10-27 ENCOUNTER — Encounter: Payer: Self-pay | Admitting: Internal Medicine

## 2017-10-27 ENCOUNTER — Ambulatory Visit (INDEPENDENT_AMBULATORY_CARE_PROVIDER_SITE_OTHER): Payer: Commercial Managed Care - PPO | Admitting: Internal Medicine

## 2017-10-27 VITALS — BP 112/70 | HR 102 | Ht 62.0 in | Wt 169.8 lb

## 2017-10-27 DIAGNOSIS — R05 Cough: Secondary | ICD-10-CM | POA: Diagnosis not present

## 2017-10-27 DIAGNOSIS — R911 Solitary pulmonary nodule: Secondary | ICD-10-CM

## 2017-10-27 DIAGNOSIS — R918 Other nonspecific abnormal finding of lung field: Secondary | ICD-10-CM | POA: Diagnosis not present

## 2017-10-27 DIAGNOSIS — R053 Chronic cough: Secondary | ICD-10-CM

## 2017-10-27 DIAGNOSIS — J387 Other diseases of larynx: Secondary | ICD-10-CM

## 2017-10-27 DIAGNOSIS — Z23 Encounter for immunization: Secondary | ICD-10-CM | POA: Diagnosis not present

## 2017-10-27 NOTE — Addendum Note (Signed)
Addended by: Lorretta Harp on: 10/27/2017 09:41 AM   Modules accepted: Orders

## 2017-10-27 NOTE — Progress Notes (Signed)
PCP Unk Pinto, MD]   HPI  IOV 09/15/2017  Chief Complaint  Patient presents with  . Consult    Self referral for cough. Pt states she has had the cough x20 years and nothing has helped it. Pt states her cough is sometimes productive with clear mucus but cough is also a dry cough at times, does become SOB if overdoing things. Denies any CP/ chest tightness.    Erin Nichols , 52 y.o. , with dob 05-22-1965 and female ,Not Hispanic or Latino from [address] [place] [address] - presents to lung  clinic for cough chroonic. She presents with her mother-in-law. 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 She reports insidious onset of cough for over 20 years it is moderate to severe. According to the motherin law actually severe.the cough is present day and night. She does bring out white sputum with this. It is associated with constant clearing of the throat and also feeling of waterfall in the back of the throat and sinus drainage. Cough is present both day and night. Drinking water can help the cough. Talking can trigger the cough but otherwise no aggravating or relieving factors.There is no radiation. This no associated chest pain or orthopnea. The might be some wheezing. She's never seen a pulmonologist for the cough but has seen allergist in [place] and apparently allergy test was essentially negative except for mild tree reaction. Recently she is seen ENT Dr. Ernesto Rutherford was seen inflamed vocal cords and is recommended acid reflux therapy. In July 2019 she had a chest x-ray that is clear. I personally visualized this. Medication review shows she is on ACE inhibitor for 5 years and fish oil for 5 years but this is not aggravated the cough. She is on acid reflux therapy but this has not helped the cough. The ACE inhibitor's for hypertension as per review of the past medical record and by the mother-in-law.  Mother-in-law also reports asbestos exposure on the patient when she was a child. Her father used to work in the [employer] in [place] and used to come home with asbestos on his body. RSI cough score is significantly elevated suggesting irritable larynx syndrome. Blood work shows normal chemistriesand CBC July 2019.      OV 10/27/2017  Subjective:  Patient ID: Erin Nichols, female , DOB: 07/14/1965 , age 80 y.o. , MRN: [id number] , ADDRESS: [place] [id number]   10/27/2017 -   Chief Complaint  Patient presents with  . Follow-up    Pt states cough is much better since last visit and states cough is almost completely gone since begun gabapentin at last visit.     HPI  Erin Nichols 52 y.o. - Cough is from   Possible acid reflux and also lisinopril and fish oil All of this is working together to cause cyclical cough/LPR cough or cough neuropathy or irritable larynx No evidence of asthma    - last visit started on gabapentin, referral to voice rehabilitation and stopping fish oil and lisinopril. With these measures cough is improved significantly. Husband is here with her. There was extremely happy with the outcome. They feel the gabapentin made a world of difference. There are no new issues.Other than the fact she had high-risk CT as part of the cough workup that only showed some air trapping without any ILD. CT did show multiple small lung nodules  2-4 millimeters in size. She has questions about it.Husband is worried that stopping the gabapentin might reduce relapse inb cough     Dr Lorenza [place] Reflux Symptom Index (> 13-15 suggestive of LPR cough)  09/15/2017 feno 13 ppb 10/27/2017   Hoarseness of problem with voice 0 0  Clearing  Of Throat 5 1  Excess throat mucus or feeling of post nasal drip 5 1  Difficulty swallowing food, liquid or tablets 0 0  Cough after eating or lying down 3 1  Breathing difficulties or choking episodes 5 0  Troublesome or annoying  cough 5 1  Sensation of something sticking in throat or lump in throat 5 0  Heartburn, chest pain, indigestion, or stomach acid coming up 0 0  TOTAL 28 4     ROS - per HPI     has a past medical history of Allergy, Asthma, GERD (gastroesophageal reflux disease) (2005), 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   reports that she has never smoked. She has never used smokeless tobacco.  Past Surgical History:  Procedure Laterality Date  . COLONOSCOPY N/A 2015   screening at age 74 - Negative  . FRACTURE SURGERY  1988   facial Fx , nose, Ankle both Arms  . l ulnar nonunion  1992  . NASAL SEPTUM SURGERY  1991  . nonunion 5th metatarsal  2008  . r sub talar fusion  2008  . redo subtalar fusion  2013  . rt ankle surg for non union  2013  . SPINE SURGERY  2011   Cx5 - Cx6 fusion w/plate/screws  . TONSILLECTOMY AND ADENOIDECTOMY  1972    No Known Allergies  Immunization History  Administered Date(s) Administered  . Influenza Inj Mdck Quad With Preservative 11/08/2016  . PPD Test 11/08/2016  . Pneumococcal-Unspecified 02/22/2015  . Td 02/22/2012  . Zoster 02/22/2015    Family History  Problem Relation Age of Onset  . Diabetes Mother   . Heart disease Father   . Hypertension Brother   . Heart disease Brother   . Stroke Maternal Grandmother      Current Outpatient Medications:  .  ALPRAZolam (XANAX) 0.5 MG tablet, Take 1/2 to1 tablet if needed for Anxiety attack (Please limit to 5 days/week to avoid addiction), Disp: 30 tablet, Rfl: 0 .  aspirin 81 MG chewable tablet, Chew by mouth daily., Disp: , Rfl:  .  atorvastatin (LIPITOR) 20 MG tablet, TAKE 1 TABLET BY MOUTH ONCE DAILY, Disp: 90 tablet, Rfl: 1 .  benzonatate (TESSALON) 200 MG capsule, Take 1 perle 3 x / day to prevent cough, Disp: 30 capsule, Rfl: 1 .  buPROPion (WELLBUTRIN XL) 300 MG 24 hr tablet, TAKE 1 TABLET BY MOUTH EVERY MORNING, Disp:  90 tablet, Rfl: 1 .  calcium citrate (CALCITRATE - DOSED IN MG ELEMENTAL CALCIUM) 950 MG tablet, Take 200 mg of elemental calcium by mouth daily., Disp: , Rfl:  .  citalopram (CELEXA) 20 MG tablet, TAKE 1 TABLET BY MOUTH ONCE DAILY, Disp: 90 tablet, Rfl: 1 .  CRANBERRY EXTRACT PO, Take 1 capsule by mouth daily., Disp: , Rfl:  .  cyclobenzaprine (FLEXERIL) 10 MG tablet, TAKE 1/2 TO 1 TABLET BY MOUTH THREE TIMES DAILY AS NEEDED, Disp: 90 tablet, Rfl: 0 .  DULERA 100-5 MCG/ACT AERO, INL 2 PFS PO BID, Disp: 39 g, Rfl: 1 .  esomeprazole (NEXIUM) 40 MG capsule, Take 40 mg by mouth daily at 12 noon., Disp: ,  Rfl:  .  Fexofenadine HCl (ALLEGRA PO), Take by mouth., Disp: , Rfl:  .  gabapentin (NEURONTIN) 300 MG capsule, Take 1capsule once daily x5 days, then 1 capsule twice daily x5 days, then 1 capsule three times daily and hold at this dose., Disp: 90 capsule, Rfl: 3 .  ibuprofen (ADVIL,MOTRIN) 600 MG tablet, TAKE 1 TABLET BY MOUTH EVERY 6 HOURS AS NEEDED, Disp: 90 tablet, Rfl: 3 .  montelukast (SINGULAIR) 10 MG tablet, TAKE 1 TABLET BY MOUTH ONCE DAILY, Disp: 90 tablet, Rfl: 3 .  Prenatal Vit-Fe Fumarate-FA (M-VIT PO), Take 1 tablet by mouth daily., Disp: , Rfl:  .  PROAIR HFA 108 (90 Base) MCG/ACT inhaler, INHALE 1 PUFF PO Q 4 H, Disp: 60 g, Rfl: 1 .  promethazine (PHENERGAN) 6.25 MG/5ML syrup, TAKE 5 ML(6.25 MG) BY MOUTH EVERY 6 HOURS AS NEEDED FOR NAUSEA OR VOMITING, Disp: 120 mL, Rfl: 0 .  vitamin B-12 (CYANOCOBALAMIN) 100 MCG tablet, Take 100 mcg by mouth 2 (two) times daily., Disp: , Rfl:  .  Vitamin D, Ergocalciferol, (DRISDOL) 50000 units CAPS capsule, TK ONE C PO ONCE A WEEK, Disp: 30 capsule, Rfl: 1 .  lisinopril (PRINIVIL,ZESTRIL) 20 MG tablet, TAKE 1 TABLET BY MOUTH ONCE DAILY (Patient not taking: Reported on 10/27/2017), Disp: 90 tablet, Rfl: 1 .  Omega-3 Fatty Acids (OMEGA-3 CF PO), Take 1 capsule by mouth daily., Disp: , Rfl:       Objective:   Vitals:   10/27/17 0910  BP: 112/70    Pulse: (!) 102  SpO2: 96%  Weight: 169 lb 12.8 oz (77 kg)  Height: 5\' 2"  (1.575 m)    Estimated body mass index is 31.06 kg/m as calculated from the following:   Height as of this encounter: 5\' 2"  (1.575 m).   Weight as of this encounter: 169 lb 12.8 oz (77 kg).  @WEIGHTCHANGE @  [employer]   10/27/17 0910  Weight: 169 lb 12.8 oz (77 kg)     Physical Exam  General Appearance:    Alert, cooperative, no distress, appears stated age - yes , sitting on - chair  Head:    Normocephalic, without obvious abnormality, atraumatic  Eyes:    PERRL, conjunctiva/corneas clear,  Ears:    Normal TM's and external ear canals, both ears  Nose:   Nares normal, septum midline, mucosa normal, no drainage    or sinus tenderness. OXYGEN ON  - no . Patient is @ room air   Throat:   Lips, mucosa, and tongue normal; teeth and gums normal. Cyanosis on lips - no  Neck:   Supple, symmetrical, trachea midline, no adenopathy;    thyroid:  no enlargement/tenderness/nodules; no carotid   bruit or JVD  Back:     Symmetric, no curvature, ROM normal, no CVA tenderness  Lungs:     Distress - no , Wheeze no, Barrell Chest - no, Purse lip breathing - no, Crackles - no   Chest Wall:    No tenderness or deformity. Scars in chest no   Heart:    Regular rate and rhythm, S1 and S2 normal, no murmur, rub   or gallop  Breast Exam:    NOT DONE  Abdomen:     Soft, non-tender, bowel sounds active all four quadrants,    no masses, no organomegaly  Genitalia:   NOT DONE  Rectal:   NOT DONE  Extremities:   Extremities normal, atraumatic, Clubbing - no, Edema - no  Pulses:   2+  and symmetric all extremities  Skin:   Stigmata of Connective Tissue Disease - no  Lymph nodes:   Cervical, supraclavicular, and axillary nodes normal  Psychiatric:  Neurologic:   pleasant CNII-XII intact, normal strength, sensation  throughout           Assessment:       ICD-10-CM   1. Chronic cough R05   2. Irritable larynx  syndrome J38.7   3. Multiple lung nodules on CT R91.8        Plan:     Chronic cough Irritable larynx syndrome  - glad much better - continue to stay off fish oil and lisinopril - voice rehab as needed - continue gabapentin as scheduled and other meds - return in 6-8 weeks and if well discuss taper gabapentin (do not stop abruptly) - flu shot 10/27/2017    Multiple lung nodules on CT -2-43mm aug 2019  - do CT chest without contrast in 1 year  Sept 2020  Followup - 6-8 weeks    SIGNATURE    Dr. Brand Males, M.D., F.C.C.P,  Pulmonary and Critical Care Medicine Staff Physician, [place] Director - Interstitial Lung Disease  Program  [facility] [place] [facility] [place], [place], [address]  Pager: [phone], If no answer or between  15:00h - 7:00h: call [phone] Telephone: [phone]  9:35 AM 10/27/2017

## 2017-10-27 NOTE — Patient Instructions (Addendum)
Chronic cough Irritable larynx syndrome  - glad much better - continue to stay off fish oil and lisinopril - voice rehab as needed - continue gabapentin as scheduled and other meds - return in 6-8 weeks and if well discuss taper gabapentin (do not stop abruptly) - flu shot 10/27/2017    Multiple lung nodules on CT -2-46mm aug 2019  - do CT chest without contrast in 1 year  Sept 2020  Followup - 6-8 weeks

## 2017-10-31 ENCOUNTER — Encounter: Payer: Commercial Managed Care - PPO | Admitting: Speech Pathology

## 2017-11-02 ENCOUNTER — Ambulatory Visit: Payer: Commercial Managed Care - PPO | Admitting: Speech Pathology

## 2017-11-07 ENCOUNTER — Encounter: Payer: Commercial Managed Care - PPO | Admitting: Speech Pathology

## 2017-11-10 ENCOUNTER — Other Ambulatory Visit: Payer: Self-pay | Admitting: Internal Medicine

## 2017-11-10 ENCOUNTER — Other Ambulatory Visit: Payer: Self-pay | Admitting: Adult Health

## 2017-11-10 DIAGNOSIS — F329 Major depressive disorder, single episode, unspecified: Secondary | ICD-10-CM

## 2017-11-10 DIAGNOSIS — Z79899 Other long term (current) drug therapy: Secondary | ICD-10-CM

## 2017-11-10 DIAGNOSIS — F32A Depression, unspecified: Secondary | ICD-10-CM

## 2017-11-27 ENCOUNTER — Ambulatory Visit: Payer: Commercial Managed Care - PPO | Admitting: Internal Medicine

## 2017-11-27 NOTE — Progress Notes (Deleted)
PCP Unk Pinto, MD]   HPI  IOV 09/15/2017  Chief Complaint  Patient presents with  . Consult    Self referral for cough. Pt states she has had the cough x20 years and nothing has helped it. Pt states her cough is sometimes productive with clear mucus but cough is also a dry cough at times, does become SOB if overdoing things. Denies any CP/ chest tightness.    DARLENY Nichols , 52 y.o. , with dob Feb 21, 1966 and female ,Not Hispanic or Latino from [address] [place] [address] - presents to lung  clinic for cough chroonic. She presents with her mother-in-law. 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 She reports insidious onset of cough for over 20 years it is moderate to severe. According to the motherin law actually severe.the cough is present day and night. She does bring out white sputum with this. It is associated with constant clearing of the throat and also feeling of waterfall in the back of the throat and sinus drainage. Cough is present both day and night. Drinking water can help the cough. Talking can trigger the cough but otherwise no aggravating or relieving factors.There is no radiation. This no associated chest pain or orthopnea. The might be some wheezing. She's never seen a pulmonologist for the cough but has seen allergist in [place] and apparently allergy test was essentially negative except for mild tree reaction. Recently she is seen ENT Dr. Ernesto Rutherford was seen inflamed vocal cords and is recommended acid reflux therapy. In July 2019 she had a chest x-ray that is clear. I personally visualized this. Medication review shows she is on ACE inhibitor for 5 years and fish oil for 5 years but this is not aggravated the cough. She is on acid reflux therapy but this has not helped the cough. The ACE inhibitor's for hypertension as per review of the past medical record and by the mother-in-law.  Mother-in-law also reports asbestos exposure on the patient when she was a child. Her father used to work in the [employer] in [place] and used to come home with asbestos on his body. RSI cough score is significantly elevated suggesting irritable larynx syndrome. Blood work shows normal chemistriesand CBC July 2019.      OV 10/27/2017  Subjective:  Patient ID: Debbe Odea, female , DOB: [birth year] , age 1 y.o. , MRN: [id number] , ADDRESS: [place] [id number]   10/27/2017 -   Chief Complaint  Patient presents with  . Follow-up    Pt states cough is much better since last visit and states cough is almost completely gone since begun gabapentin at last visit.     HPI  Erin Nichols 52 y.o. - Cough is from   Possible acid reflux and also lisinopril and fish oil All of this is working together to cause cyclical cough/LPR cough or cough neuropathy or irritable larynx No evidence of asthma    - last visit started on gabapentin, referral to voice rehabilitation and stopping fish oil and lisinopril. With these measures cough is improved significantly. Husband is here with her. There was extremely happy with the outcome. They feel the gabapentin made a world of difference. There are no new issues.Other than the fact she had high-risk CT as part of the cough workup that only showed some air trapping without any ILD. CT did show multiple small  lung nodules 2-4 millimeters in size. She has questions about it.Husband is worried that stopping the gabapentin might reduce relapse inb cough    ROS - per HPI   OV 11/27/2017  Subjective:  Patient ID: Debbe Odea, female , DOB: Feb 28, 1965 , age 49 y.o. , MRN: [id number] , ADDRESS: [place] [id number]   11/27/2017 -  No chief complaint on file.    HPI Erin Nichols 52 y.o. -      Dr Lorenza Cambridge Reflux Symptom Index (> 13-15 suggestive of LPR cough)  09/15/2017 feno 13 ppb 10/27/2017     Hoarseness of problem with voice 0 0  Clearing  Of Throat 5 1  Excess throat mucus or feeling of post nasal drip 5 1  Difficulty swallowing food, liquid or tablets 0 0  Cough after eating or lying down 3 1  Breathing difficulties or choking episodes 5 0  Troublesome or annoying cough 5 1  Sensation of something sticking in throat or lump in throat 5 0  Heartburn, chest pain, indigestion, or stomach acid coming up 0 0  TOTAL 28 4     ROS - per HPI     has a past medical history of Allergy, Asthma, GERD (gastroesophageal reflux disease) (2005), 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   reports that she has never smoked. She has never used smokeless tobacco.  Past Surgical History:  Procedure Laterality Date  . COLONOSCOPY N/A 2015   screening at age 8 - Negative  . FRACTURE SURGERY  1988   facial Fx , nose, Ankle both Arms  . l ulnar nonunion  1992  . NASAL SEPTUM SURGERY  1991  . nonunion 5th metatarsal  2008  . r sub talar fusion  2008  . redo subtalar fusion  2013  . rt ankle surg for non union  2013  . SPINE SURGERY  2011   Cx5 - Cx6 fusion w/plate/screws  . TONSILLECTOMY AND ADENOIDECTOMY  1972    No Known Allergies  Immunization History  Administered Date(s) Administered  . Influenza Inj Mdck Quad With Preservative 11/08/2016  . Influenza,inj,Quad PF,6+ Mos 10/27/2017  . PPD Test 11/08/2016  . Pneumococcal-Unspecified 02/22/2015  . Td 02/22/2012  . Zoster 02/22/2015    Family History  Problem Relation Age of Onset  . Diabetes Mother   . Heart disease Father   . Hypertension Brother   . Heart disease Brother   . Stroke Maternal Grandmother      Current Outpatient Medications:  .  ALPRAZolam (XANAX) 0.5 MG tablet, TAKE 1/2 TO 1 TABLET IF NEEDED FOR ANXIETY ATTACK- LIMIT TO 5 DAYS A WEEK TO AVOID ADDICTION, Disp: 30 tablet, Rfl: 0 .  aspirin 81 MG chewable tablet, Chew by mouth  daily., Disp: , Rfl:  .  atorvastatin (LIPITOR) 20 MG tablet, TAKE 1 TABLET BY MOUTH ONCE DAILY, Disp: 90 tablet, Rfl: 1 .  benzonatate (TESSALON) 200 MG capsule, Take 1 perle 3 x / day to prevent cough, Disp: 30 capsule, Rfl: 1 .  buPROPion (WELLBUTRIN XL) 300 MG 24 hr tablet, TAKE 1 TABLET BY MOUTH EVERY MORNING, Disp: 90 tablet, Rfl: 1 .  calcium citrate (CALCITRATE - DOSED IN MG ELEMENTAL CALCIUM) 950 MG tablet, Take 200 mg of elemental calcium by mouth daily., Disp: , Rfl:  .  citalopram (CELEXA) 20 MG tablet, TAKE 1 TABLET BY MOUTH ONCE DAILY, Disp: 90 tablet, Rfl: 1 .  CRANBERRY EXTRACT PO, Take 1 capsule by mouth daily., Disp: , Rfl:  .  cyclobenzaprine (FLEXERIL) 10 MG tablet, TAKE 1/2 TO 1 TABLET BY MOUTH THREE TIMES DAILY AS NEEDED, Disp: 90 tablet, Rfl: 0 .  DULERA 100-5 MCG/ACT AERO, INL 2 PFS PO BID, Disp: 39 g, Rfl: 1 .  esomeprazole (NEXIUM) 40 MG capsule, Take 40 mg by mouth daily at 12 noon., Disp: , Rfl:  .  Fexofenadine HCl (ALLEGRA PO), Take by mouth., Disp: , Rfl:  .  gabapentin (NEURONTIN) 300 MG capsule, Take 1capsule once daily x5 days, then 1 capsule twice daily x5 days, then 1 capsule three times daily and hold at this dose., Disp: 90 capsule, Rfl: 3 .  ibuprofen (ADVIL,MOTRIN) 600 MG tablet, TAKE 1 TABLET BY MOUTH EVERY 6 HOURS AS NEEDED, Disp: 90 tablet, Rfl: 3 .  lisinopril (PRINIVIL,ZESTRIL) 20 MG tablet, TAKE 1 TABLET BY MOUTH ONCE DAILY (Patient not taking: Reported on 10/27/2017), Disp: 90 tablet, Rfl: 1 .  montelukast (SINGULAIR) 10 MG tablet, TAKE 1 TABLET BY MOUTH ONCE DAILY, Disp: 90 tablet, Rfl: 3 .  Omega-3 Fatty Acids (OMEGA-3 CF PO), Take 1 capsule by mouth daily., Disp: , Rfl:  .  Prenatal Vit-Fe Fumarate-FA (M-VIT PO), Take 1 tablet by mouth daily., Disp: , Rfl:  .  PROAIR HFA 108 (90 Base) MCG/ACT inhaler, INHALE 1 PUFF PO Q 4 H, Disp: 60 g, Rfl: 1 .  promethazine (PHENERGAN) 6.25 MG/5ML syrup, TAKE 5 ML(6.25 MG) BY MOUTH EVERY 6 HOURS AS NEEDED FOR NAUSEA  OR VOMITING, Disp: 120 mL, Rfl: 0 .  vitamin B-12 (CYANOCOBALAMIN) 100 MCG tablet, Take 100 mcg by mouth 2 (two) times daily., Disp: , Rfl:  .  Vitamin D, Ergocalciferol, (DRISDOL) 50000 units CAPS capsule, TK ONE C PO ONCE A WEEK, Disp: 30 capsule, Rfl: 1      Objective:   There were no vitals filed for this visit.  Estimated body mass index is 31.06 kg/m as calculated from the following:   Height as of 10/27/17: 5\' 2"  (1.575 m).   Weight as of 10/27/17: 169 lb 12.8 oz (77 kg).  @WEIGHTCHANGE @  There were no vitals filed for this visit.   Physical Exam  General Appearance:    Alert, cooperative, no distress, appears stated age - *** , Deconditioned looking - *** , OBESE  - ***, Sitting on Wheelchair -  ***  Head:    Normocephalic, without obvious abnormality, atraumatic  Eyes:    PERRL, conjunctiva/corneas clear,  Ears:    Normal TM's and external ear canals, both ears  Nose:   Nares normal, septum midline, mucosa normal, no drainage    or sinus tenderness. OXYGEN ON  - *** . Patient is @ ***   Throat:   Lips, mucosa, and tongue normal; teeth and gums normal. Cyanosis on lips - ***  Neck:   Supple, symmetrical, trachea midline, no adenopathy;    thyroid:  no enlargement/tenderness/nodules; no carotid   bruit or JVD  Back:     Symmetric, no curvature, ROM normal, no CVA tenderness  Lungs:     Distress - *** , Wheeze ***, Barrell Chest - ***, Purse lip breathing - ***, Crackles - ***   Chest Wall:    No tenderness or deformity.    Heart:    Regular rate and rhythm, S1 and S2 normal, no rub   or gallop, Murmur - ***  Breast Exam:    NOT DONE  Abdomen:  Soft, non-tender, bowel sounds active all four quadrants,    no masses, no organomegaly. Visceral obesity - ***  Genitalia:   NOT DONE  Rectal:   NOT DONE  Extremities:   Extremities - normal, Has Cane - ***, Clubbing - ***, Edema - ***  Pulses:   2+ and symmetric all extremities  Skin:   Stigmata of Connective Tissue Disease -  ***  Lymph nodes:   Cervical, supraclavicular, and axillary nodes normal  Psychiatric:  Neurologic:   Pleasant - ***, Anxious - ***, Flat affect - ***  CAm-ICU - neg, Alert and Oriented x 3 - yes, Moves all 4s - yes, Speech - normal, Cognition - intact           Assessment:     No diagnosis found.     Plan:     There are no Patient Instructions on file for this visit.    SIGNATURE    Dr. Brand Males, M.D., F.C.C.P,  Pulmonary and Critical Care Medicine Staff Physician, [place] Director - Interstitial Lung Disease  Program  Pulmonary [place] at [place], [place], [address]  Pager: [phone], If no answer or between  15:00h - 7:00h: call [phone] Telephone: [phone]  9:02 AM 11/27/2017

## 2017-11-28 ENCOUNTER — Ambulatory Visit (INDEPENDENT_AMBULATORY_CARE_PROVIDER_SITE_OTHER): Payer: Commercial Managed Care - PPO | Admitting: Internal Medicine

## 2017-11-28 ENCOUNTER — Encounter: Payer: Self-pay | Admitting: Internal Medicine

## 2017-11-28 VITALS — BP 126/80 | HR 109 | Ht 62.0 in | Wt 178.0 lb

## 2017-11-28 DIAGNOSIS — J387 Other diseases of larynx: Secondary | ICD-10-CM | POA: Diagnosis not present

## 2017-11-28 DIAGNOSIS — R05 Cough: Secondary | ICD-10-CM

## 2017-11-28 DIAGNOSIS — R053 Chronic cough: Secondary | ICD-10-CM

## 2017-11-28 NOTE — Progress Notes (Signed)
PCP Unk Pinto, MD]   HPI  IOV 09/15/2017  Chief Complaint  Patient presents with  . Consult    Self referral for cough. Pt states she has had the cough x20 years and nothing has helped it. Pt states her cough is sometimes productive with clear mucus but cough is also a dry cough at times, does become SOB if overdoing things. Denies any CP/ chest tightness.    Erin Nichols , 52 y.o. , with dob Feb 11, 1966 and female ,Not Hispanic or Latino from [address] [place] [address] - presents to lung  clinic for cough chroonic. She presents with her mother-in-law. 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 She reports insidious onset of cough for over 20 years it is moderate to severe. According to the motherin law actually severe.the cough is present day and night. She does bring out white sputum with this. It is associated with constant clearing of the throat and also feeling of waterfall in the back of the throat and sinus drainage. Cough is present both day and night. Drinking water can help the cough. Talking can trigger the cough but otherwise no aggravating or relieving factors.There is no radiation. This no associated chest pain or orthopnea. The might be some wheezing. She's never seen a pulmonologist for the cough but has seen allergist in [place] and apparently allergy test was essentially negative except for mild tree reaction. Recently she is seen ENT Dr. Ernesto Rutherford was seen inflamed vocal cords and is recommended acid reflux therapy. In July 2019 she had a chest x-ray that is clear. I personally visualized this. Medication review shows she is on ACE inhibitor for 5 years and fish oil for 5 years but this is not aggravated the cough. She is on acid reflux therapy but this has not helped the cough. The ACE inhibitor's for hypertension as per review of the past medical record and by the mother-in-law.  Mother-in-law also reports asbestos exposure on the patient when she was a child. Her father used to work in the [employer] in [place] and used to come home with asbestos on his body. RSI cough score is significantly elevated suggesting irritable larynx syndrome. Blood work shows normal chemistriesand CBC July 2019.      OV 10/27/2017  Subjective:  Patient ID: Erin Nichols, female , DOB: 24-Jan-1966 , age 60 y.o. , MRN: [id number] , ADDRESS: [place] [address]   10/27/2017 -   Chief Complaint  Patient presents with  . Follow-up    Pt states cough is much better since last visit and states cough is almost completely gone since begun gabapentin at last visit.     HPI  Erin Nichols 52 y.o. - Cough is from   Possible acid reflux and also lisinopril and fish oil All of this is working together to cause cyclical cough/LPR cough or cough neuropathy or irritable larynx No evidence of asthma    - last visit started on gabapentin, referral to voice rehabilitation and stopping fish oil and lisinopril. With these measures cough is improved significantly. Husband is here with her. There was extremely happy with the outcome. They feel the gabapentin made a world of difference. There are no new issues.Other than the fact she had high-risk CT as part of the cough workup that only showed some air trapping without any ILD. CT did show multiple small lung nodules  2-4 millimeters in size. She has questions about it.Husband is worried that stopping the gabapentin might reduce relapse inb cough        ROS - per HPI    OV 11/28/2017  Subjective:  Patient ID: Erin Nichols, female , DOB: 1965-11-04 , age 66 y.o. , MRN: [id number] , ADDRESS: [place] [address]   11/28/2017 -   Chief Complaint  Patient presents with  . Follow-up    c/o sinus congestion, pnd X1 week.  states that cough is well controlled.      HPI Erin Nichols 52 y.o.  -routine follow-up for chronic cough to see how she is doing.  She tells me she is doing great.  She says that the gabapentin has been a miracle for her.  She does not have any sinus issues.  She does not have any voice hoarseness.  At this point in time she is off lisinopril.  She is off fish oil and she is also of Dulera.  She continues Singulair.  There are no new issues.  She says she is ready to try to wean herself off gabapentin.     Dr Lorenza Cambridge Reflux Symptom Index (> 13-15 suggestive of LPR cough)  09/15/2017 feno 13 ppb 10/27/2017   Hoarseness of problem with voice 0 0  Clearing  Of Throat 5 1  Excess throat mucus or feeling of post nasal drip 5 1  Difficulty swallowing food, liquid or tablets 0 0  Cough after eating or lying down 3 1  Breathing difficulties or choking episodes 5 0  Troublesome or annoying cough 5 1  Sensation of something sticking in throat or lump in throat 5 0  Heartburn, chest pain, indigestion, or stomach acid coming up 0 0  TOTAL 28 4    ROS - per HPI     has a past medical history of Allergy, Asthma, GERD (gastroesophageal reflux disease) (2005), 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   reports that she has never smoked. She has never used smokeless tobacco.  Past Surgical History:  Procedure Laterality Date  . COLONOSCOPY N/A 2015   screening at age 21 - Negative  . FRACTURE SURGERY  1988   facial Fx , nose, Ankle both Arms  . l ulnar nonunion  1992  . NASAL SEPTUM SURGERY  1991  . nonunion 5th metatarsal  2008  . r sub talar fusion  2008  . redo subtalar fusion  2013  . rt ankle surg for non union  2013  . SPINE SURGERY  2011   Cx5 - Cx6 fusion w/plate/screws  . TONSILLECTOMY AND ADENOIDECTOMY  1972    No Known Allergies  Immunization History  Administered Date(s) Administered  . Influenza Inj Mdck Quad With Preservative 11/08/2016  . Influenza,inj,Quad PF,6+  Mos 10/27/2017  . PPD Test 11/08/2016  . Pneumococcal-Unspecified 02/22/2015  . Td 02/22/2012  . Zoster 02/22/2015    Family History  Problem Relation Age of Onset  . Diabetes Mother   . Heart disease Father   . Hypertension Brother   . Heart disease Brother   . Stroke Maternal Grandmother      Current Outpatient Medications:  .  ALPRAZolam (XANAX) 0.5 MG tablet, TAKE 1/2 TO 1 TABLET IF NEEDED FOR ANXIETY ATTACK- LIMIT TO 5 DAYS A WEEK TO AVOID ADDICTION, Disp: 30 tablet, Rfl: 0 .  aspirin 81 MG chewable tablet, Chew by mouth daily., Disp: ,  Rfl:  .  atorvastatin (LIPITOR) 20 MG tablet, TAKE 1 TABLET BY MOUTH ONCE DAILY, Disp: 90 tablet, Rfl: 1 .  benzonatate (TESSALON) 200 MG capsule, Take 1 perle 3 x / day to prevent cough, Disp: 30 capsule, Rfl: 1 .  buPROPion (WELLBUTRIN XL) 300 MG 24 hr tablet, TAKE 1 TABLET BY MOUTH EVERY MORNING, Disp: 90 tablet, Rfl: 1 .  calcium citrate (CALCITRATE - DOSED IN MG ELEMENTAL CALCIUM) 950 MG tablet, Take 200 mg of elemental calcium by mouth daily., Disp: , Rfl:  .  citalopram (CELEXA) 20 MG tablet, TAKE 1 TABLET BY MOUTH ONCE DAILY, Disp: 90 tablet, Rfl: 1 .  CRANBERRY EXTRACT PO, Take 1 capsule by mouth daily., Disp: , Rfl:  .  cyclobenzaprine (FLEXERIL) 10 MG tablet, TAKE 1/2 TO 1 TABLET BY MOUTH THREE TIMES DAILY AS NEEDED, Disp: 90 tablet, Rfl: 0 .  DULERA 100-5 MCG/ACT AERO, INL 2 PFS PO BID, Disp: 39 g, Rfl: 1 .  esomeprazole (NEXIUM) 40 MG capsule, Take 40 mg by mouth daily at 12 noon., Disp: , Rfl:  .  Fexofenadine HCl (ALLEGRA PO), Take by mouth., Disp: , Rfl:  .  gabapentin (NEURONTIN) 300 MG capsule, Take 1capsule once daily x5 days, then 1 capsule twice daily x5 days, then 1 capsule three times daily and hold at this dose., Disp: 90 capsule, Rfl: 3 .  ibuprofen (ADVIL,MOTRIN) 600 MG tablet, TAKE 1 TABLET BY MOUTH EVERY 6 HOURS AS NEEDED, Disp: 90 tablet, Rfl: 3 .  montelukast (SINGULAIR) 10 MG tablet, TAKE 1 TABLET BY MOUTH ONCE  DAILY, Disp: 90 tablet, Rfl: 3 .  Prenatal Vit-Fe Fumarate-FA (M-VIT PO), Take 1 tablet by mouth daily., Disp: , Rfl:  .  PROAIR HFA 108 (90 Base) MCG/ACT inhaler, INHALE 1 PUFF PO Q 4 H, Disp: 60 g, Rfl: 1 .  promethazine (PHENERGAN) 6.25 MG/5ML syrup, TAKE 5 ML(6.25 MG) BY MOUTH EVERY 6 HOURS AS NEEDED FOR NAUSEA OR VOMITING, Disp: 120 mL, Rfl: 0 .  vitamin B-12 (CYANOCOBALAMIN) 100 MCG tablet, Take 100 mcg by mouth 2 (two) times daily., Disp: , Rfl:  .  Vitamin D, Ergocalciferol, (DRISDOL) 50000 units CAPS capsule, TK ONE C PO ONCE A WEEK, Disp: 30 capsule, Rfl: 1      Objective:   Vitals:   11/28/17 0942  BP: 126/80  Pulse: (!) 109  SpO2: 96%  Weight: 178 lb (80.7 kg)  Height: 5\' 2"  (1.575 m)    Estimated body mass index is 32.56 kg/m as calculated from the following:   Height as of this encounter: 5\' 2"  (1.575 m).   Weight as of this encounter: 178 lb (80.7 kg).  @WEIGHTCHANGE @  [employer]   11/28/17 0942  Weight: 178 lb (80.7 kg)     Physical Exam  General Appearance:    Alert, cooperative, no distress, appears stated age - chari , Deconditioned looking - no , OBESE  - yes, Sitting on Wheelchair -  no  Head:    Normocephalic, without obvious abnormality, atraumatic  Eyes:    PERRL, conjunctiva/corneas clear,  Ears:    Normal TM's and external ear canals, both ears  Nose:   Nares normal, septum midline, mucosa normal, no drainage    or sinus tenderness. OXYGEN ON  - no . Patient is @ ra   Throat:   Lips, mucosa, and tongue normal; teeth and gums normal. Cyanosis on lips - no  Neck:   Supple, symmetrical, trachea midline, no adenopathy;  thyroid:  no enlargement/tenderness/nodules; no carotid   bruit or JVD  Back:     Symmetric, no curvature, ROM normal, no CVA tenderness  Lungs:     Distress - no , Wheeze no, Barrell Chest - no, Purse lip breathing - no, Crackles - no   Chest Wall:    No tenderness or deformity.    Heart:    Regular rate and rhythm, S1 and S2  normal, no rub   or gallop, Murmur - no  Breast Exam:    NOT DONE  Abdomen:     Soft, non-tender, bowel sounds active all four quadrants,    no masses, no organomegaly. Visceral obesity - yes  Genitalia:   NOT DONE  Rectal:   NOT DONE  Extremities:   Extremities - normal, Has Cane - no, Clubbing - no, Edema - no  Pulses:   2+ and symmetric all extremities  Skin:   Stigmata of Connective Tissue Disease - no  Lymph nodes:   Cervical, supraclavicular, and axillary nodes normal  Psychiatric:  Neurologic:   Pleasant - yes, Anxious - no, Flat affect - no  CAm-ICU - neg, Alert and Oriented x 3 - yes, Moves all 4s - yes, Speech - normal, Cognition - intact           Assessment:       ICD-10-CM   1. Chronic cough R05   2. Irritable larynx syndrome J38.7        Plan:     Patient Instructions     ICD-10-CM   1. Chronic cough R05   2. Irritable larynx syndrome J38.7    Glad doing well off lisinopril, fish oil, dulera Glad gabapentin working well for you  Plan - reduce gabapentin 300mg  twice daily x 1 month starting December 05, 2017  -  then 300 mg once daily x1 month , starting January 05, 2018 -Then gabapentin 300 mg once daily Monday, Wednesday, and Friday x1 month starting February 04, 2018 -Then gabapentin 3oo mg once a week on Monday starting March 07, 2017 -Then fully stopped gabapentin by February 52,019  Follow-up 3 months or sooner if needed;l at follow-up reassess if she needs Singulair     SIGNATURE    Dr. Brand Males, M.D., F.C.C.P,  Pulmonary and Critical Care Medicine Staff Physician, [place] Director - Interstitial Lung Disease  Program  Pulmonary [place] at [place], [place], [address]  Pager: [phone], If no answer or between  15:00h - 7:00h: call [phone] Telephone: [phone]  10:24 AM 11/28/2017

## 2017-11-28 NOTE — Patient Instructions (Addendum)
ICD-10-CM   1. Chronic cough R05   2. Irritable larynx syndrome J38.7    Glad doing well off lisinopril, fish oil, dulera Glad gabapentin working well for you  Plan - reduce gabapentin 300mg  twice daily x 1 month starting December 05, 2017  -  then 300 mg once daily x1 month , starting January 05, 2018 -Then gabapentin 300 mg once daily Monday, Wednesday, and Friday x1 month starting February 04, 2018 -Then gabapentin 3oo mg once a week on Monday starting March 07, 2017 -Then fully stopped gabapentin by February 52,019  Follow-up 3 months or sooner if needed;l at follow-up reassess if she needs Singulair

## 2017-12-14 ENCOUNTER — Ambulatory Visit (INDEPENDENT_AMBULATORY_CARE_PROVIDER_SITE_OTHER): Payer: Commercial Managed Care - PPO | Admitting: Internal Medicine

## 2017-12-14 ENCOUNTER — Encounter: Payer: Self-pay | Admitting: Internal Medicine

## 2017-12-14 VITALS — BP 106/78 | HR 80 | Temp 97.8°F | Resp 16 | Ht 63.0 in | Wt 173.8 lb

## 2017-12-14 DIAGNOSIS — Z1212 Encounter for screening for malignant neoplasm of rectum: Secondary | ICD-10-CM

## 2017-12-14 DIAGNOSIS — Z8249 Family history of ischemic heart disease and other diseases of the circulatory system: Secondary | ICD-10-CM

## 2017-12-14 DIAGNOSIS — E782 Mixed hyperlipidemia: Secondary | ICD-10-CM | POA: Diagnosis not present

## 2017-12-14 DIAGNOSIS — Z1211 Encounter for screening for malignant neoplasm of colon: Secondary | ICD-10-CM

## 2017-12-14 DIAGNOSIS — Z111 Encounter for screening for respiratory tuberculosis: Secondary | ICD-10-CM | POA: Diagnosis not present

## 2017-12-14 DIAGNOSIS — Z0001 Encounter for general adult medical examination with abnormal findings: Secondary | ICD-10-CM

## 2017-12-14 DIAGNOSIS — Z0000 Encounter for general adult medical examination without abnormal findings: Secondary | ICD-10-CM

## 2017-12-14 DIAGNOSIS — E559 Vitamin D deficiency, unspecified: Secondary | ICD-10-CM

## 2017-12-14 DIAGNOSIS — I10 Essential (primary) hypertension: Secondary | ICD-10-CM | POA: Diagnosis not present

## 2017-12-14 DIAGNOSIS — R5383 Other fatigue: Secondary | ICD-10-CM

## 2017-12-14 DIAGNOSIS — R7309 Other abnormal glucose: Secondary | ICD-10-CM | POA: Diagnosis not present

## 2017-12-14 DIAGNOSIS — Z136 Encounter for screening for cardiovascular disorders: Secondary | ICD-10-CM | POA: Diagnosis not present

## 2017-12-14 DIAGNOSIS — K219 Gastro-esophageal reflux disease without esophagitis: Secondary | ICD-10-CM

## 2017-12-14 DIAGNOSIS — Z79899 Other long term (current) drug therapy: Secondary | ICD-10-CM

## 2017-12-14 NOTE — Patient Instructions (Signed)

## 2017-12-14 NOTE — Progress Notes (Signed)
[name], M.D.     Erin Nichols. Erin Nichols, P.A.-C [name], [place] [address] [place], [place] [address] Telephone [phone] Telefax [phone] Annual Screening/Preventative Visit & Comprehensive Evaluation &  Examination     This very nice 52 y.o. MWF presents for a Screening /Preventative Visit & comprehensive evaluation and management of multiple medical co-morbidities.  Patient has been followed for HTN, HLD, Prediabetes  and Vitamin D Deficiency. Patient has hx/o GERD controlled on Nexium and also ha hx/o allergies.       HTN predates circa 2012. Patient's BP has been controlled at home and patient denies any cardiac symptoms as chest pain, palpitations, shortness of breath, dizziness or ankle swelling. Today's BP is at goal - 106/78.      Patient's hyperlipidemia is controlled with diet and medications. Patient denies myalgias or other medication SE's. Last lipids were at goal: Lab Results  Component Value Date   CHOL 170 09/12/2017   HDL 51 09/12/2017   LDLCALC 95 09/12/2017   TRIG 138 09/12/2017   CHOLHDL 3.3 09/12/2017      Patient is screened proactively for prediabetes  and patient denies reactive hypoglycemic symptoms, visual blurring, diabetic polys or paresthesias. Last A1c was Normal: Lab Results  Component Value Date   HGBA1C 5.1 05/18/2017      Finally, patient has history of Vitamin D Deficiency ("27"/2015) and last Vitamin D was nearer goal (70-100): Lab Results  Component Value Date   VD25OH 51 05/18/2017   Current Outpatient Medications on File Prior to Visit  Medication Sig  . ALPRAZolam (XANAX) 0.5 MG tablet TAKE 1/2 TO 1 TABLET IF NEEDED FOR ANXIETY ATTACK- LIMIT TO 5 DAYS A WEEK TO AVOID ADDICTION  . aspirin 81 MG chewable tablet Chew by mouth daily.  Marland Kitchen atorvastatin (LIPITOR) 20 MG tablet TAKE 1 TABLET BY MOUTH ONCE DAILY  . benzonatate (TESSALON)  200 MG capsule Take 1 perle 3 x / day to prevent cough  . buPROPion (WELLBUTRIN XL) 300 MG 24 hr tablet TAKE 1 TABLET BY MOUTH EVERY MORNING  . calcium citrate (CALCITRATE - DOSED IN MG ELEMENTAL CALCIUM) 950 MG tablet Take 200 mg of elemental calcium by mouth daily.  . citalopram (CELEXA) 20 MG tablet TAKE 1 TABLET BY MOUTH ONCE DAILY  . cyclobenzaprine (FLEXERIL) 10 MG tablet TAKE 1/2 TO 1 TABLET BY MOUTH THREE TIMES DAILY AS NEEDED  . DULERA 100-5 MCG/ACT AERO INL 2 PFS PO BID  . esomeprazole (NEXIUM) 40 MG capsule Take 40 mg by mouth daily at 12 noon.  Marland Kitchen Fexofenadine HCl (ALLEGRA PO) Take by mouth.  . gabapentin (NEURONTIN) 300 MG capsule Take 1capsule once daily x5 days, then 1 capsule twice daily x5 days, then 1 capsule three times daily and hold at this dose.  . ibuprofen (ADVIL,MOTRIN) 600 MG tablet TAKE 1 TABLET BY MOUTH EVERY 6 HOURS AS NEEDED  . montelukast (SINGULAIR) 10 MG tablet TAKE 1 TABLET BY MOUTH ONCE DAILY  . Prenatal Vit-Fe Fumarate-FA (M-VIT PO) Take 1 tablet by mouth daily.  Marland Kitchen PROAIR HFA 108 (90 Base) MCG/ACT inhaler INHALE 1 PUFF PO Q 4 H  . promethazine (PHENERGAN) 6.25 MG/5ML syrup TAKE 5 ML(6.25 MG) BY MOUTH EVERY 6 HOURS AS NEEDED FOR NAUSEA OR VOMITING  . vitamin B-12 (CYANOCOBALAMIN) 100 MCG tablet Take 100 mcg by mouth 2 (two) times daily.  . Vitamin D, Ergocalciferol, (DRISDOL) 50000 units CAPS capsule TK ONE C PO  ONCE A WEEK   No current facility-administered medications on file prior to visit.    No Known Allergies Past Medical History:  Diagnosis Date  . Allergy   . Asthma   . GERD (gastroesophageal reflux disease) 2005  . 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 Hyperlipidemia 2012  . Hypertension 2012  . Vitamin D deficiency    Health Maintenance  Topic Date Due  . Hepatitis C Screening  09[birth year]01/1963  . HIV Screening  09/11/1978  . PAP SMEAR  09/10/1984  . COLONOSCOPY  09/10/2013  . MAMMOGRAM  03/27/2018  . TETANUS/TDAP  02/21/2022  .  INFLUENZA VACCINE  Completed   Immunization History  Administered Date(s) Administered  . Influenza Inj Mdck Quad With Preservative 11/08/2016  . Influenza,inj,Quad PF,6+ Mos 10/27/2017  . PPD Test 11/08/2016  . Pneumococcal-Unspecified 02/22/2015  . Td 02/22/2012  . Zoster 02/22/2015   Last Colon -  Last MGM -  Past Surgical History:  Procedure Laterality Date  . COLONOSCOPY N/A 2015   screening at age 27 - Negative  . FRACTURE SURGERY  1988   facial Fx , nose, Ankle both Arms  . l ulnar nonunion  1992  . NASAL SEPTUM SURGERY  1991  . nonunion 5th metatarsal  2008  . r sub talar fusion  2008  . redo subtalar fusion  2013  . rt ankle surg for non union  2013  . SPINE SURGERY  2011   Cx5 - Cx6 fusion w/plate/screws  . TONSILLECTOMY AND ADENOIDECTOMY  1972   Family History  Problem Relation Age of Onset  . Diabetes Mother   . Heart disease Father   . Hypertension Brother   . Heart disease Brother   . Stroke Maternal Grandmother    Social History   Tobacco Use  . Smoking status: Never Smoker  . Smokeless tobacco: Never Used  Substance Use Topics  . Alcohol use: No  . Drug use: No    ROS Constitutional: Denies fever, chills, weight loss/gain, headaches, insomnia,  night sweats, and change in appetite. Does c/o fatigue. Eyes: Denies redness, blurred vision, diplopia, discharge, itchy, watery eyes.  ENT: Denies discharge, congestion, post nasal drip, epistaxis, sore throat, earache, hearing loss, dental pain, Tinnitus, Vertigo, Sinus pain, snoring.  Cardio: Denies chest pain, palpitations, irregular heartbeat, syncope, dyspnea, diaphoresis, orthopnea, PND, claudication, edema Respiratory: denies cough, dyspnea, DOE, pleurisy, hoarseness, laryngitis, wheezing.  Gastrointestinal: Denies dysphagia, heartburn, reflux, water brash, pain, cramps, nausea, vomiting, bloating, diarrhea, constipation, hematemesis, melena, hematochezia, jaundice, hemorrhoids Genitourinary: Denies  dysuria, frequency, urgency, nocturia, hesitancy, discharge, hematuria, flank pain Breast: Breast lumps, nipple discharge, bleeding.  Musculoskeletal: Denies arthralgia, myalgia, stiffness, Jt. Swelling, pain, limp, and strain/sprain. Denies falls. Skin: Denies puritis, rash, hives, warts, acne, eczema, changing in skin lesion Neuro: No weakness, tremor, incoordination, spasms, paresthesia, pain Psychiatric: Denies confusion, memory loss, sensory loss. Denies Depression. Endocrine: Denies change in weight, skin, hair change, nocturia, and paresthesia, diabetic polys, visual blurring, hyper / hypo glycemic episodes.  Heme/Lymph: No excessive bleeding, bruising, enlarged lymph nodes.  Physical Exam  BP 106/78   Pulse 80   Temp 97.8 F (36.6 C)   Resp 16   Ht 5\' 3"  (1.6 m)   Wt 173 lb 12.8 oz (78.8 kg)   BMI 30.79 kg/m   General Appearance: Well nourished, well groomed and in no apparent distress.  Eyes: PERRLA, EOMs, conjunctiva no swelling or erythema, normal fundi and vessels. Sinuses: No frontal/maxillary tenderness ENT/Mouth: EACs patent /  TMs  nl. Nares clear without erythema, swelling, mucoid exudates. Oral hygiene is good. No erythema, swelling, or exudate. Tongue normal, non-obstructing. Tonsils not swollen or erythematous. Hearing normal.  Neck: Supple, thyroid not palpable. No bruits, nodes or JVD. Respiratory: Respiratory effort normal.  BS equal and clear bilateral without rales, rhonci, wheezing or stridor. Cardio: Heart sounds are normal with regular rate and rhythm and no murmurs, rubs or gallops. Peripheral pulses are normal and equal bilaterally without edema. No aortic or femoral bruits. Chest: symmetric with normal excursions and percussion. Breasts: Symmetric, without lumps, nipple discharge, retractions, or fibrocystic changes.  Abdomen: Flat, soft with bowel sounds active. Nontender, no guarding, rebound, hernias, masses, or organomegaly.  Lymphatics: Non tender  without lymphadenopathy.  Genitourinary:  Musculoskeletal: Full ROM all peripheral extremities, joint stability, 5/5 strength, and normal gait. Skin: Warm and dry without rashes, lesions, cyanosis, clubbing or  ecchymosis.  Neuro: Cranial nerves intact, reflexes equal bilaterally. Normal muscle tone, no cerebellar symptoms. Sensation intact.  Pysch: Alert and oriented X 3, normal affect, Insight and Judgment appropriate.   Assessment and Plan  1. Annual Preventative Screening Examination  2. Essential hypertension  - EKG 12-Lead - [place], RETROPERITNL ABD,  LTD - Urinalysis, Routine w reflex microscopic - Microalbumin / creatinine urine ratio - CBC with Differential/Platelet - COMPLETE METABOLIC PANEL WITH GFR - Magnesium - TSH  3. Hyperlipidemia, mixed  - EKG 12-Lead - [place], RETROPERITNL ABD,  LTD - Lipid panel - TSH  4. Abnormal glucose  - EKG 12-Lead - [place], RETROPERITNL ABD,  LTD - Hemoglobin A1c - Insulin, random  5. Vitamin D deficiency  - VITAMIN D 25 Hydroxyl  6. Gastroesophageal reflux disease  - CBC with Differential/Platelet  7. Screening for ischemic heart disease  - EKG 12-Lead  8. FHx: heart disease  - EKG 12-Lead - [place], RETROPERITNL ABD,  LTD  9. Screening for AAA (aortic abdominal aneurysm)  - [place], RETROPERITNL ABD,  LTD  10. Screening examination for pulmonary tuberculosis   11. Fatigue, unspecified type  - Iron,Total/Total Iron Binding Cap - Vitamin B12 - CBC with Differential/Platelet  12. Medication management  - Urinalysis, Routine w reflex microscopic - Microalbumin / creatinine urine ratio - CBC with Differential/Platelet - COMPLETE METABOLIC PANEL WITH GFR - Magnesium - Lipid panel - TSH - Hemoglobin A1c - Insulin, random - VITAMIN D 25 Hydroxyl  13. Screening for colorectal cancer  - POC Hemoccult Bld/Stl l       Patient was counseled in prudent diet to achieve/maintain BMI less than 25 for weight control, BP  monitoring, regular exercise and medications. Discussed med's effects and SE's. Screening labs and tests as requested with regular follow-up as recommended. Over 40 minutes of exam, counseling, chart review and high complex critical decision making was performed.

## 2017-12-15 ENCOUNTER — Other Ambulatory Visit: Payer: Self-pay | Admitting: Internal Medicine

## 2017-12-15 DIAGNOSIS — Z79899 Other long term (current) drug therapy: Secondary | ICD-10-CM

## 2017-12-15 DIAGNOSIS — F32A Depression, unspecified: Secondary | ICD-10-CM

## 2017-12-15 DIAGNOSIS — F329 Major depressive disorder, single episode, unspecified: Secondary | ICD-10-CM

## 2017-12-15 LAB — HEMOGLOBIN A1C
HEMOGLOBIN A1C: 5.3 %{Hb} (ref <5.7)
MEAN PLASMA GLUCOSE: 105 (calc)
eAG (mmol/L): 5.8 (calc)

## 2017-12-15 LAB — TSH: TSH: 2.33 mIU/L

## 2017-12-15 LAB — COMPLETE METABOLIC PANEL WITH GFR
AG Ratio: 1.6 (calc) (ref 1.0–2.5)
ALKALINE PHOSPHATASE (APISO): 146 U/L — AB (ref 33–130)
ALT: 14 U/L (ref 6–29)
AST: 17 U/L (ref 10–35)
Albumin: 4.3 g/dL (ref 3.6–5.1)
BILIRUBIN TOTAL: 0.4 mg/dL (ref 0.2–1.2)
BUN: 15 mg/dL (ref 7–25)
CHLORIDE: 103 mmol/L (ref 98–110)
CO2: 30 mmol/L (ref 20–32)
CREATININE: 1.01 mg/dL (ref 0.50–1.05)
Calcium: 9.4 mg/dL (ref 8.6–10.4)
GFR, Est African American: 73 mL/min/{1.73_m2} (ref >=60)
GFR, Est Non African American: 63 mL/min/{1.73_m2} (ref >=60)
GLUCOSE: 82 mg/dL (ref 65–99)
Globulin: 2.7 g/dL (calc) (ref 1.9–3.7)
Potassium: 4.3 mmol/L (ref 3.5–5.3)
SODIUM: 143 mmol/L (ref 135–146)
Total Protein: 7 g/dL (ref 6.1–8.1)

## 2017-12-15 LAB — CBC WITH DIFFERENTIAL/PLATELET
BASOS ABS: 62 {cells}/uL (ref 0–200)
Basophils Relative: 1.1 %
EOS ABS: 90 {cells}/uL (ref 15–500)
Eosinophils Relative: 1.6 %
HEMATOCRIT: 38.3 % (ref 35.0–45.0)
HEMOGLOBIN: 12.8 g/dL (ref 11.7–15.5)
Lymphs Abs: 991 cells/uL (ref 850–3900)
MCH: 30.4 pg (ref 27.0–33.0)
MCHC: 33.4 g/dL (ref 32.0–36.0)
MCV: 91 fL (ref 80.0–100.0)
MPV: 10.9 fL (ref 7.5–12.5)
Monocytes Relative: 8.6 %
NEUTROS ABS: 3976 {cells}/uL (ref 1500–7800)
Neutrophils Relative %: 71 %
Platelets: 225 10*3/uL (ref 140–400)
RBC: 4.21 10*6/uL (ref 3.80–5.10)
RDW: 12.4 % (ref 11.0–15.0)
Total Lymphocyte: 17.7 %
WBC: 5.6 10*3/uL (ref 3.8–10.8)
WBCMIX: 482 {cells}/uL (ref 200–950)

## 2017-12-15 LAB — URINALYSIS, ROUTINE W REFLEX MICROSCOPIC
BILIRUBIN URINE: NEGATIVE
Bacteria, UA: NONE SEEN /HPF
GLUCOSE, UA: NEGATIVE
Hgb urine dipstick: NEGATIVE
Hyaline Cast: NONE SEEN /LPF
KETONES UR: NEGATIVE
NITRITE: NEGATIVE
Protein, ur: NEGATIVE
RBC / HPF: NONE SEEN /HPF (ref 0–2)
SPECIFIC GRAVITY, URINE: 1.005 (ref 1.001–1.03)
SQUAMOUS EPITHELIAL / LPF: NONE SEEN /HPF (ref <=5)
pH: 8 (ref 5.0–8.0)

## 2017-12-15 LAB — IRON, TOTAL/TOTAL IRON BINDING CAP
%SAT: 22 % (ref 16–45)
IRON: 77 ug/dL (ref 45–160)
TIBC: 354 ug/dL (ref 250–450)

## 2017-12-15 LAB — LIPID PANEL
CHOLESTEROL: 136 mg/dL (ref <200)
HDL: 51 mg/dL (ref >50)
LDL CHOLESTEROL (CALC): 68 mg/dL
Non-HDL Cholesterol (Calc): 85 mg/dL (calc) (ref <130)
TRIGLYCERIDES: 83 mg/dL (ref <150)
Total CHOL/HDL Ratio: 2.7 (calc) (ref <5.0)

## 2017-12-15 LAB — MAGNESIUM: MAGNESIUM: 2.1 mg/dL (ref 1.5–2.5)

## 2017-12-15 LAB — INSULIN, RANDOM: INSULIN: 7.9 u[IU]/mL (ref 2.0–19.6)

## 2017-12-15 LAB — MICROALBUMIN / CREATININE URINE RATIO
Creatinine, Urine: 25 mg/dL (ref 20–275)
Microalb Creat Ratio: 8 mcg/mg creat (ref <30)
Microalb, Ur: 0.2 mg/dL

## 2017-12-15 LAB — VITAMIN B12: Vitamin B-12: 460 pg/mL (ref 200–1100)

## 2017-12-15 LAB — VITAMIN D 25 HYDROXY (VIT D DEFICIENCY, FRACTURES): VIT D 25 HYDROXY: 52 ng/mL (ref 30–100)

## 2017-12-31 ENCOUNTER — Other Ambulatory Visit: Payer: Self-pay | Admitting: Internal Medicine

## 2017-12-31 DIAGNOSIS — J0410 Acute tracheitis without obstruction: Secondary | ICD-10-CM

## 2017-12-31 DIAGNOSIS — J0140 Acute pansinusitis, unspecified: Secondary | ICD-10-CM

## 2018-01-02 ENCOUNTER — Other Ambulatory Visit: Payer: Self-pay | Admitting: Internal Medicine

## 2018-01-15 ENCOUNTER — Other Ambulatory Visit: Payer: Self-pay | Admitting: Internal Medicine

## 2018-01-15 DIAGNOSIS — Z79899 Other long term (current) drug therapy: Secondary | ICD-10-CM

## 2018-01-15 DIAGNOSIS — J0140 Acute pansinusitis, unspecified: Secondary | ICD-10-CM

## 2018-01-15 DIAGNOSIS — F329 Major depressive disorder, single episode, unspecified: Secondary | ICD-10-CM

## 2018-01-15 DIAGNOSIS — F32A Depression, unspecified: Secondary | ICD-10-CM

## 2018-01-15 DIAGNOSIS — J0410 Acute tracheitis without obstruction: Secondary | ICD-10-CM

## 2018-02-06 DIAGNOSIS — Z77122 Contact with and (suspected) exposure to noise: Secondary | ICD-10-CM | POA: Diagnosis not present

## 2018-02-06 DIAGNOSIS — H903 Sensorineural hearing loss, bilateral: Secondary | ICD-10-CM | POA: Diagnosis not present

## 2018-02-06 DIAGNOSIS — H9313 Tinnitus, bilateral: Secondary | ICD-10-CM | POA: Diagnosis not present

## 2018-02-07 DIAGNOSIS — H5310 Unspecified subjective visual disturbances: Secondary | ICD-10-CM | POA: Diagnosis not present

## 2018-02-07 DIAGNOSIS — H40053 Ocular hypertension, bilateral: Secondary | ICD-10-CM | POA: Diagnosis not present

## 2018-02-10 ENCOUNTER — Other Ambulatory Visit: Payer: Self-pay | Admitting: Adult Health

## 2018-02-10 ENCOUNTER — Other Ambulatory Visit: Payer: Self-pay | Admitting: Internal Medicine

## 2018-02-10 DIAGNOSIS — Z79899 Other long term (current) drug therapy: Secondary | ICD-10-CM

## 2018-02-10 DIAGNOSIS — F329 Major depressive disorder, single episode, unspecified: Secondary | ICD-10-CM

## 2018-02-10 DIAGNOSIS — F32A Depression, unspecified: Secondary | ICD-10-CM

## 2018-02-23 ENCOUNTER — Telehealth: Payer: Self-pay | Admitting: Internal Medicine

## 2018-02-23 NOTE — Telephone Encounter (Signed)
Spoke with pt. She is aware of Brian's recommendations. Nothing further was needed at this time.

## 2018-02-23 NOTE — Telephone Encounter (Signed)
Can use over-the-counter Mucinex D or Delsym over-the-counter cough syrup.    Patient with chronic cough that is typically managed as cough neuropathy with gabapentin.  So this may end up causing a chronic cough flare but she can try these over-the-counter measures first.  If cough is not improving patient will need office visit.  Please ensure the patient has scheduled follow-up with Dr. Chase Caller.  Wyn Quaker, FNP

## 2018-02-23 NOTE — Telephone Encounter (Signed)
Spoke with pt. States that she is not feeling well and thinks she has a cold. Reports increased coughing and wheezing. Cough is non productive. Denies chest tightness, SOB or fever. Symptoms started 2-3 days ago. Pt is wanting recommendations on what to take for this. She declined coming for a visit.  Aaron Edelman - please advise. Thanks.

## 2018-02-26 ENCOUNTER — Encounter: Payer: Self-pay | Admitting: Internal Medicine

## 2018-03-08 ENCOUNTER — Other Ambulatory Visit: Payer: Self-pay

## 2018-03-08 DIAGNOSIS — Z1212 Encounter for screening for malignant neoplasm of rectum: Principal | ICD-10-CM

## 2018-03-08 DIAGNOSIS — Z1211 Encounter for screening for malignant neoplasm of colon: Secondary | ICD-10-CM

## 2018-03-08 LAB — POC HEMOCCULT BLD/STL (HOME/3-CARD/SCREEN)
FECAL OCCULT BLD: NEGATIVE
FECAL OCCULT BLD: NEGATIVE
Fecal Occult Blood, POC: NEGATIVE

## 2018-03-09 DIAGNOSIS — Z1211 Encounter for screening for malignant neoplasm of colon: Secondary | ICD-10-CM | POA: Diagnosis not present

## 2018-03-12 ENCOUNTER — Other Ambulatory Visit: Payer: Self-pay | Admitting: Adult Health

## 2018-03-12 ENCOUNTER — Other Ambulatory Visit: Payer: Self-pay | Admitting: Internal Medicine

## 2018-03-12 DIAGNOSIS — F32A Depression, unspecified: Secondary | ICD-10-CM

## 2018-03-12 DIAGNOSIS — F329 Major depressive disorder, single episode, unspecified: Secondary | ICD-10-CM

## 2018-03-12 DIAGNOSIS — Z79899 Other long term (current) drug therapy: Secondary | ICD-10-CM

## 2018-03-16 DIAGNOSIS — H40053 Ocular hypertension, bilateral: Secondary | ICD-10-CM | POA: Diagnosis not present

## 2018-03-16 DIAGNOSIS — H5310 Unspecified subjective visual disturbances: Secondary | ICD-10-CM | POA: Diagnosis not present

## 2018-03-21 NOTE — Progress Notes (Signed)
FOLLOW UP  Assessment and Plan:   Hypertension Well controlled with current medications  Monitor blood pressure at home; patient to call if consistently greater than 130/80 Continue DASH diet.   Reminder to go to the ER if any CP, SOB, nausea, dizziness, severe HA, changes vision/speech, left arm numbness and tingling and jaw pain.  Cholesterol At goal on statin; continue medication  Continue low cholesterol diet and exercise.  Check lipid panel.   Other abnormal glucose Discussed risks associated with elevated glucose Recommend prudent diet, portion control, maintenance of weight in normal range, regular aerobic exercise Defer A1C; check annually - monitor sugars by CMP  GERD Improved control with nexium; not a candidate for H2i at this time, monitor magnesium Discussed diet, avoiding triggers and other lifestyle changes  Obesity Long discussion about weight loss, diet, and exercise Recommended diet heavy in fruits and veggies and low in animal meats, cheeses, and dairy products, appropriate calorie intake Discussed appropriate weight for height, initial goal set for <170lb Recommended she try Lose it! Or MyfitnessPAL app for calorie and macro tracking Follow up in 3 months  Depression/anxiety Well controlled by current regimen; uses xanax sparingly Lifestyle discussed: diet/exerise, sleep hygiene, stress management, hydration  Vitamin D Def/ osteoporosis prevention Near goal at recent check; she has not changed dose Suggested she try 10000 IU daily  Defer vitamin D level to next visit    Continue diet and meds as discussed. Further disposition pending results of labs. Discussed med's effects and SE's.   Over 30 minutes of exam, counseling, chart review, and critical decision making was performed.   Future Appointments  Date Time Provider [place]  06/27/2018 10:30 AM Unk Pinto, MD [facility] None  01/03/2019 10:00 AM Unk Pinto, MD [facility]  None    ----------------------------------------------------------------------------------------------------------------------  HPI 53 y.o. female  presents for 3 month follow up on hypertension, cholesterol, GERD, blood sugar monitoring, weight, depression and vitamin D deficiency.   She has had some vision changes, seeing "yellow honey combs" on any flat surfaces, was evaluated by Dr. Satira Sark, and has been referred to see neurophthalmologist Dr. Harle Stanford with [facility]. She wears glasses. Denies HA, blurry vision, double vision. Dizziness. Denies weight loss or fatigue.   BMI is Body mass index is 32.24 kg/m., she has been working on diet and exercise, she walks at least a mile a day, pushing water intake. 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 She is watching her diet, eating salads and not sure why she isn't losing weight.  Wt Readings from Last 3 Encounters:  03/22/18 182 lb (82.6 kg)  12/14/17 173 lb 12.8 oz (78.8 kg)  11/28/17 178 lb (80.7 kg)   she has ongoing depression with anxious features and is currently on celexa 20 mg daily, wellbutrin XR 300 mg daily, xanax 0.5 mg PRN (currently taking 1/per day or so when having a particularly stressful day, uses 2-3 times per week), reports symptoms are well controlled on current regimen.   she has a diagnosis of GERD which is currently managed by nexium 40 mg - recommended by ENT due to persistent dry cough on previous agent she reports symptoms is currently well controlled, and denies breakthrough reflux, burning in chest, hoarseness, cough.   Her blood pressure has been controlled at home, today their BP is BP: 110/76  She does workout. She denies chest pain, shortness of breath, dizziness.   She is on cholesterol medication (atorvastatin 20 mg daily)  and denies myalgias. Her cholesterol is at goal. The cholesterol last visit was:   Lab Results  Component Value Date   CHOL 136 12/14/2017   HDL 51  12/14/2017   LDLCALC 68 12/14/2017   TRIG 83 12/14/2017   CHOLHDL 2.7 12/14/2017    She has been working on diet and exercise for blood sugar control, and denies increased appetite, nausea, paresthesia of the feet, polydipsia, polyuria, visual disturbances and vomiting. Last A1C in the office was:  Lab Results  Component Value Date   HGBA1C 5.3 12/14/2017   Patient is on Vitamin D supplement  Lab Results  Component Value Date   VD25OH 52 12/14/2017      Current Medications:  Current Outpatient Medications on File Prior to Visit  Medication Sig  . ALPRAZolam (XANAX) 0.5 MG tablet TAKE 1/2 TO 1 TABLET BY MOUTH DAILY ONLY IF NEEDED FOR ANXIETY ATTACK. LIMIT TO 5 DAYS/WEEK TO AVOID ADDICTION.  Marland Kitchen aspirin 81 MG chewable tablet Chew by mouth daily.  Marland Kitchen atorvastatin (LIPITOR) 20 MG tablet TAKE 1 TABLET BY MOUTH ONCE DAILY  . buPROPion (WELLBUTRIN XL) 300 MG 24 hr tablet TAKE 1 TABLET BY MOUTH EVERY MORNING  . calcium citrate (CALCITRATE - DOSED IN MG ELEMENTAL CALCIUM) 950 MG tablet Take 200 mg of elemental calcium by mouth daily.  . citalopram (CELEXA) 20 MG tablet TAKE 1 TABLET BY MOUTH ONCE DAILY  . cyclobenzaprine (FLEXERIL) 10 MG tablet Take 1/2 to 1 tablet up to 3 x /day ONLY if needed for Muscle Spasm  . DULERA 100-5 MCG/ACT AERO INL 2 PFS PO BID (Patient taking differently: INL 2 PFS PO BID PRN)  . esomeprazole (NEXIUM) 40 MG capsule Take 40 mg by mouth daily at 12 noon.  Marland Kitchen Fexofenadine HCl (ALLEGRA PO) Take by mouth.  . gabapentin (NEURONTIN) 300 MG capsule Take300mg dailyandthenbeginningDec.15,take300mg on Mon,Wed,Fri,then300mg onceaweekonMonbeginningJan15,2020 (Patient taking differently: Takes one a day)  . ibuprofen (ADVIL,MOTRIN) 600 MG tablet TAKE 1 TABLET BY MOUTH EVERY 6 HOURS AS NEEDED  . montelukast (SINGULAIR) 10 MG tablet TAKE 1 TABLET BY MOUTH ONCE DAILY  . Prenatal Vit-Fe Fumarate-FA (M-VIT PO) Take 1 tablet by mouth daily.  Marland Kitchen PROAIR HFA 108 (90 Base) MCG/ACT inhaler  INHALE 1 PUFF PO Q 4 H (Patient taking differently: INHALE 1 PUFF PO Q 4 H PRN)  . promethazine (PHENERGAN) 6.25 MG/5ML syrup TAKE 5 ML(6.25 MG) BY MOUTH EVERY 6 HOURS AS NEEDED FOR NAUSEA OR VOMITING  . promethazine-dextromethorphan (PROMETHAZINE-DM) 6.25-15 MG/5ML syrup TAKE 5 TO 10 ML BY MOUTH EVERY 4 HOURS IF NEEDED FOR COUGH  . vitamin B-12 (CYANOCOBALAMIN) 100 MCG tablet Take 100 mcg by mouth 2 (two) times daily.  . Vitamin D, Ergocalciferol, (DRISDOL) 50000 units CAPS capsule TK ONE C PO ONCE A WEEK  . benzonatate (TESSALON) 200 MG capsule Take 1 perle 3 x / day to prevent cough   No current facility-administered medications on file prior to visit.      Allergies: No Known Allergies   Medical History:  Past Medical History:  Diagnosis Date  . Allergy   . Asthma   . GERD (gastroesophageal reflux disease) 2005  . 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 Hyperlipidemia 2012  . Hypertension 2012  . Vitamin D deficiency    Family history- Reviewed and unchanged Social history- Reviewed and unchanged   Review of Systems:  Review of Systems  Constitutional: Negative for malaise/fatigue and weight loss.  HENT: Negative for hearing loss and tinnitus.  Eyes: Negative for blurred vision and double vision.  Respiratory: Positive for cough (dry cough, improved with nexium, seeing ENT for this). Negative for shortness of breath and wheezing.   Cardiovascular: Negative for chest pain, palpitations, orthopnea, claudication and leg swelling.  Gastrointestinal: Negative for abdominal pain, blood in stool, constipation, diarrhea, heartburn, melena, nausea and vomiting.  Genitourinary: Negative.   Musculoskeletal: Negative for joint pain and myalgias.  Skin: Negative for rash.  Neurological: Negative for dizziness, tingling, sensory change, weakness and headaches.  Endo/Heme/Allergies: Negative for polydipsia.  Psychiatric/Behavioral: Negative for depression and substance abuse. The  patient is nervous/anxious.   All other systems reviewed and are negative.    Physical Exam: BP 110/76   Pulse (!) 102   Temp (!) 96.8 F (36 C)   Ht 5\' 3"  (1.6 m)   Wt 182 lb (82.6 kg)   SpO2 95%   BMI 32.24 kg/m  Wt Readings from Last 3 Encounters:  03/22/18 182 lb (82.6 kg)  12/14/17 173 lb 12.8 oz (78.8 kg)  11/28/17 178 lb (80.7 kg)   General Appearance: Well nourished, in no apparent distress. Eyes: PERRLA, EOMs, conjunctiva no swelling or erythema Sinuses: No Frontal/maxillary tenderness ENT/Mouth: Ext aud canals clear, TMs without erythema, bulging. No erythema, swelling, or exudate on post pharynx.  Tonsils not swollen or erythematous. Hearing normal.  Neck: Supple, thyroid normal.  Respiratory: Respiratory effort normal, BS equal bilaterally without rales, rhonchi, wheezing or stridor.  Cardio: RRR with no MRGs. Brisk peripheral pulses without edema.  Abdomen: Soft, + BS.  Non tender, no guarding, rebound, hernias, masses. Lymphatics: Non tender without lymphadenopathy.  Musculoskeletal: Full ROM, 5/5 strength, Normal gait Skin: Warm, dry without rashes, lesions, ecchymosis.  Neuro: Cranial nerves intact. No cerebellar symptoms.  Psych: Awake and oriented X 3, normal affect, Insight and Judgment appropriate.    Izora Ribas, NP 11:01 AM [facility]

## 2018-03-22 ENCOUNTER — Ambulatory Visit (INDEPENDENT_AMBULATORY_CARE_PROVIDER_SITE_OTHER): Payer: Commercial Managed Care - PPO | Admitting: Adult Health

## 2018-03-22 ENCOUNTER — Encounter: Payer: Self-pay | Admitting: Adult Health

## 2018-03-22 VITALS — BP 110/76 | HR 102 | Temp 96.8°F | Ht 63.0 in | Wt 182.0 lb

## 2018-03-22 DIAGNOSIS — I10 Essential (primary) hypertension: Secondary | ICD-10-CM

## 2018-03-22 DIAGNOSIS — E559 Vitamin D deficiency, unspecified: Secondary | ICD-10-CM | POA: Diagnosis not present

## 2018-03-22 DIAGNOSIS — E669 Obesity, unspecified: Secondary | ICD-10-CM

## 2018-03-22 DIAGNOSIS — E782 Mixed hyperlipidemia: Secondary | ICD-10-CM | POA: Diagnosis not present

## 2018-03-22 DIAGNOSIS — F329 Major depressive disorder, single episode, unspecified: Secondary | ICD-10-CM

## 2018-03-22 DIAGNOSIS — K219 Gastro-esophageal reflux disease without esophagitis: Secondary | ICD-10-CM | POA: Diagnosis not present

## 2018-03-22 DIAGNOSIS — R7309 Other abnormal glucose: Secondary | ICD-10-CM

## 2018-03-22 DIAGNOSIS — F32A Depression, unspecified: Secondary | ICD-10-CM

## 2018-03-22 NOTE — Patient Instructions (Addendum)
Goals    . DIET - INCREASE WATER INTAKE     65-80+ fluid ounces     . Exercise 150 min/wk Moderate Activity      Try switching to 10000 IU cholecalciferol (vitamin D) - can do 5000 IU x 2 caps daily    Try using an app for food tracking - Lose it! Or MyfitnessPAL     Drink 1/2 your body weight in fluid ounces of water daily; drink a tall glass of water 30 min before meals  Don't eat until you're stuffed- listen to your stomach and eat until you are 80% full   Try eating off of a salad plate; wait 10 min after finishing before going back for seconds  Start by eating the vegetables on your plate; aim for 50% of your meals to be fruits or vegetables  Then eat your protein - lean meats (grass fed if possible), fish, beans, nuts in moderation  Eat your carbs/starch last ONLY if you still are hungry. If you can, stop before finishing it all  Avoid sugar and flour - the closer it looks to it's original form in nature, typically the better it is for you  Splurge in moderation - "assign" days when you get to splurge and have the "bad stuff" - I like to follow a 80% - 20% plan- "good" choices 80 % of the time, "bad" choices in moderation 20% of the time  Simple equation is: Calories out > calories in = weight loss - even if you eat the bad stuff, if you limit portions, you will still lose weight        When it comes to diets, agreement about the perfect plan isn't easy to find, even among the experts. Experts at the [employer] developed an idea known as the Healthy Eating Plate. Just imagine a plate divided into logical, healthy portions.  The emphasis is on diet quality:  Load up on vegetables and fruits - one-half of your plate: Aim for color and variety, and remember that potatoes don't count.  Go for whole grains - one-quarter of your plate: Whole wheat, barley, wheat berries, quinoa, oats, brown rice, and foods made with them. If you want pasta, go with  whole wheat pasta.  Protein power - one-quarter of your plate: Fish, chicken, beans, and nuts are all healthy, versatile protein sources. Limit red meat.  The diet, however, does go beyond the plate, offering a few other suggestions.  Use healthy plant oils, such as olive, canola, soy, corn, sunflower and peanut. Check the labels, and avoid partially hydrogenated oil, which have unhealthy trans fats.  If you're thirsty, drink water. Coffee and tea are good in moderation, but skip sugary drinks and limit milk and dairy products to one or two daily servings.  The type of carbohydrate in the diet is more important than the amount. Some sources of carbohydrates, such as vegetables, fruits, whole grains, and beans-are healthier than others.  Finally, stay active.

## 2018-03-23 LAB — CBC WITH DIFFERENTIAL/PLATELET
ABSOLUTE MONOCYTES: 419 {cells}/uL (ref 200–950)
Basophils Absolute: 48 cells/uL (ref 0–200)
Basophils Relative: 0.9 %
Eosinophils Absolute: 90 cells/uL (ref 15–500)
Eosinophils Relative: 1.7 %
HCT: 39.5 % (ref 35.0–45.0)
Hemoglobin: 13 g/dL (ref 11.7–15.5)
Lymphs Abs: 1124 cells/uL (ref 850–3900)
MCH: 30.2 pg (ref 27.0–33.0)
MCHC: 32.9 g/dL (ref 32.0–36.0)
MCV: 91.6 fL (ref 80.0–100.0)
MPV: 10.7 fL (ref 7.5–12.5)
Monocytes Relative: 7.9 %
NEUTROS PCT: 68.3 %
Neutro Abs: 3620 cells/uL (ref 1500–7800)
Platelets: 249 10*3/uL (ref 140–400)
RBC: 4.31 10*6/uL (ref 3.80–5.10)
RDW: 12.7 % (ref 11.0–15.0)
Total Lymphocyte: 21.2 %
WBC: 5.3 10*3/uL (ref 3.8–10.8)

## 2018-03-23 LAB — COMPLETE METABOLIC PANEL WITH GFR
AG Ratio: 1.6 (calc) (ref 1.0–2.5)
ALT: 16 U/L (ref 6–29)
AST: 19 U/L (ref 10–35)
Albumin: 4.4 g/dL (ref 3.6–5.1)
Alkaline phosphatase (APISO): 132 U/L — ABNORMAL HIGH (ref 33–130)
BUN: 18 mg/dL (ref 7–25)
CO2: 32 mmol/L (ref 20–32)
Calcium: 10.2 mg/dL (ref 8.6–10.4)
Chloride: 103 mmol/L (ref 98–110)
Creat: 1.03 mg/dL (ref 0.50–1.05)
GFR, Est African American: 71 mL/min/{1.73_m2} (ref >=60)
GFR, Est Non African American: 62 mL/min/{1.73_m2} (ref >=60)
GLUCOSE: 80 mg/dL (ref 65–99)
Globulin: 2.7 g/dL (calc) (ref 1.9–3.7)
Potassium: 4.9 mmol/L (ref 3.5–5.3)
Sodium: 142 mmol/L (ref 135–146)
Total Bilirubin: 0.4 mg/dL (ref 0.2–1.2)
Total Protein: 7.1 g/dL (ref 6.1–8.1)

## 2018-03-23 LAB — LIPID PANEL
Cholesterol: 150 mg/dL (ref <200)
HDL: 54 mg/dL (ref >50)
LDL Cholesterol (Calc): 73 mg/dL (calc)
Non-HDL Cholesterol (Calc): 96 mg/dL (calc) (ref <130)
TRIGLYCERIDES: 143 mg/dL (ref <150)
Total CHOL/HDL Ratio: 2.8 (calc) (ref <5.0)

## 2018-03-23 LAB — TSH: TSH: 3.02 mIU/L

## 2018-03-23 LAB — MAGNESIUM: Magnesium: 2 mg/dL (ref 1.5–2.5)

## 2018-03-28 ENCOUNTER — Encounter: Payer: Self-pay | Admitting: Speech Pathology

## 2018-03-28 ENCOUNTER — Other Ambulatory Visit: Payer: Self-pay | Admitting: Internal Medicine

## 2018-03-28 DIAGNOSIS — F32A Depression, unspecified: Secondary | ICD-10-CM

## 2018-03-28 DIAGNOSIS — F329 Major depressive disorder, single episode, unspecified: Secondary | ICD-10-CM

## 2018-03-28 DIAGNOSIS — Z79899 Other long term (current) drug therapy: Secondary | ICD-10-CM

## 2018-03-28 NOTE — Therapy (Signed)
[facility] [address] [place], [place], [address] Phone: [phone]   Fax:  [phone]  Patient Details  Name: Erin Nichols MRN: [id number] Date of Birth: [birth year] Referring Provider:  Dr. Brand Males  Encounter Date: 03/28/2018    SPEECH THERAPY DISCHARGE SUMMARY  Visits from Start of Care: 5  Current functional level related to goals / functional outcomes: See goals below   Remaining deficits: Chronic cough/VCD   Education / Equipment: Abdominal breathing; sniff/blow; vocal hygiene Plan: Patient agrees to discharge.  Patient goals were not met. Patient is being discharged due to not returning since the last visit.  ?????             SLP Short Term Goals - 03/28/18 1258      SLP SHORT TERM GOAL #1   Title  Pt will converse for 8 minutes with abdominal breathing with occasional min A over 2 sessions    Baseline  10/24/17;     Time  2    Period  Weeks    Status  Not Met      SLP SHORT TERM GOAL #2   Title  Pt will report successful carryover of relaxation techniques outside of therapy over 3 sessions    Baseline  10-16-17; 10/24/17    Time  2    Period  Weeks    Status  Not Met      SLP SHORT TERM GOAL #3   Title  Pt will report decreased frequency of VCD symptoms outside of therapy by 25% subjectively    Status  Not Met      SLP SHORT TERM GOAL #4   Status  On-going       SLP Long Term Goals - 03/28/18 1259      SLP LONG TERM GOAL #1   Title  Pt will report/demonstrate relaxation strategies outside of ST over 6 sessions    Baseline  10-16-17; 10/19/17; 10/24/17    Time  6    Period  Weeks    Status  Not Met      SLP LONG TERM GOAL #2   Title  Pt will converse for 12 minute utilizing abdominal breathing over 2 sessions with rare min A    Baseline  10/24/17;     Time  6    Period  Weeks    Status  Not Met      SLP LONG TERM GOAL #3   Title  Pt will report decreased  frequency of VCD symptoms outside of therapy by 50% subjectively    Status  Not Met      SLP LONG TERM GOAL #4   Title  Pt will report decreased frequency of VCD symptoms outside of therapy by 80% subjectively over 4 sessions    Baseline  10-16-17; 10/24/17;     Time  6    Period  Weeks    Status  Not Met            03/28/2018         Jazzalyn Loewenstein, Annye Rusk MS, CCC-SLP 03/28/2018, 1:00 PM  [facility] [address] [place], [place], [address] Phone: [phone]   Fax:  [phone]

## 2018-03-29 ENCOUNTER — Other Ambulatory Visit: Payer: Self-pay | Admitting: Internal Medicine

## 2018-03-29 MED ORDER — CYCLOBENZAPRINE HCL 10 MG PO TABS: ORAL_TABLET | ORAL | 0 refills | Status: DC

## 2018-04-12 ENCOUNTER — Other Ambulatory Visit: Payer: Self-pay | Admitting: Adult Health

## 2018-04-12 ENCOUNTER — Other Ambulatory Visit: Payer: Self-pay | Admitting: Internal Medicine

## 2018-04-12 DIAGNOSIS — F32A Depression, unspecified: Secondary | ICD-10-CM

## 2018-04-12 DIAGNOSIS — Z79899 Other long term (current) drug therapy: Secondary | ICD-10-CM

## 2018-04-12 DIAGNOSIS — F329 Major depressive disorder, single episode, unspecified: Secondary | ICD-10-CM

## 2018-05-02 ENCOUNTER — Other Ambulatory Visit: Payer: Self-pay | Admitting: Internal Medicine

## 2018-05-06 ENCOUNTER — Other Ambulatory Visit: Payer: Self-pay | Admitting: Internal Medicine

## 2018-05-06 DIAGNOSIS — F329 Major depressive disorder, single episode, unspecified: Secondary | ICD-10-CM

## 2018-05-06 DIAGNOSIS — F32A Depression, unspecified: Secondary | ICD-10-CM

## 2018-05-06 DIAGNOSIS — E782 Mixed hyperlipidemia: Secondary | ICD-10-CM

## 2018-05-08 NOTE — Telephone Encounter (Signed)
At his last ov in Oct, 2019, Dr Chase Caller indicated plan to taper patient off gabapentin. Aaron Edelman saw in January and declined to refill, asking patient be scheduled back with Dr Chase Caller- which has not happened. I cannot refill gabapentin. Please make patient an appointment with Dr Chase Caller for her cough.

## 2018-05-08 NOTE — Telephone Encounter (Signed)
Please advise if we can refill this, thank you.

## 2018-05-09 ENCOUNTER — Other Ambulatory Visit: Payer: Self-pay | Admitting: Internal Medicine

## 2018-05-09 DIAGNOSIS — F329 Major depressive disorder, single episode, unspecified: Secondary | ICD-10-CM

## 2018-05-09 DIAGNOSIS — F32A Depression, unspecified: Secondary | ICD-10-CM

## 2018-05-09 DIAGNOSIS — Z79899 Other long term (current) drug therapy: Secondary | ICD-10-CM

## 2018-05-12 ENCOUNTER — Other Ambulatory Visit: Payer: Self-pay | Admitting: Internal Medicine

## 2018-05-12 MED ORDER — CYCLOBENZAPRINE HCL 10 MG PO TABS: ORAL_TABLET | ORAL | 0 refills | Status: DC

## 2018-05-14 ENCOUNTER — Telehealth: Payer: Self-pay | Admitting: Internal Medicine

## 2018-05-14 NOTE — Telephone Encounter (Signed)
Returned call to patent and advised of plan for tapering off Gabapentin in Oct 2019. She states she go to taking1 Mon, Wed, Fri and cough came back. She has been taking 1 per day for about 3 weeks now and requesting refill.  Plan - reduce gabapentin 300mg  twice daily x 1 month starting December 05, 2017  -  then 300 mg once daily x1 month , starting January 05, 2018 -Then gabapentin 300 mg once daily Monday, Wednesday, and Friday x1 month starting February 04, 2018 -Then gabapentin 3oo mg once a week on Monday starting March 07, 2017 -Then fully stopped gabapentin by February 52,019

## 2018-05-15 MED ORDER — GABAPENTIN 300 MG PO CAPS: ORAL_CAPSULE | ORAL | 1 refills | Status: DC

## 2018-05-15 NOTE — Telephone Encounter (Signed)
That is fine. But she needs to come In fall 2020 for followup or sooner if needed

## 2018-05-15 NOTE — Telephone Encounter (Signed)
MR, please advise if you are okay with [place] refilling pt's gabapentin. Thanks!

## 2018-05-15 NOTE — Telephone Encounter (Signed)
Called and spoke with patient, she is aware. Refill sent. Nothing further needed.

## 2018-05-18 ENCOUNTER — Telehealth: Payer: Self-pay | Admitting: Internal Medicine

## 2018-05-18 MED ORDER — GABAPENTIN 300 MG PO CAPS: 300.0000 mg | ORAL_CAPSULE | Freq: Every day | ORAL | 1 refills | Status: DC

## 2018-05-18 NOTE — Telephone Encounter (Signed)
Yes that is fine

## 2018-05-18 NOTE — Telephone Encounter (Signed)
[facility] who states rx Gabapentin has been filled for a 90 day and is ready for pick-up. According to directions patient should now be taking one 300mg  tablet weekly.  Dose, Route, Frequency: As Directed   Dispense Quantity: 90 capsule Refills: 1 Fills remaining: --        Sig: Take300mg dailyandthenbeginningDec.15,take300mg on Mon,Wed,Fri,then300mg onceaweekonMonbeginningJan15,2020       Written Date: 05/15/18 Expiration Date: 05/15/19    Start Date: 05/15/18 End Date: --         Ordering Provider: Brand Males, MD DEA #:  [id number] NPI:  [id number]

## 2018-05-18 NOTE — Telephone Encounter (Signed)
Spoke with pt. She is aware of MR's response. Rx has been sent in. Nothing further was needed. 

## 2018-05-18 NOTE — Telephone Encounter (Signed)
Pt states taking after dropping down to one tab weekly sx of cough returned. She has been taking one 300 mg tablet once a day. This has been controlling her cough. Is it ok to change rx to one tab daily?

## 2018-05-18 NOTE — Telephone Encounter (Signed)
Pt returning call CB# [phone]//kob

## 2018-05-28 ENCOUNTER — Other Ambulatory Visit: Payer: Self-pay

## 2018-05-28 DIAGNOSIS — F329 Major depressive disorder, single episode, unspecified: Secondary | ICD-10-CM

## 2018-05-28 DIAGNOSIS — F32A Depression, unspecified: Secondary | ICD-10-CM

## 2018-05-28 MED ORDER — BUPROPION HCL ER (XL) 300 MG PO TB24: 300.0000 mg | ORAL_TABLET | Freq: Every morning | ORAL | 1 refills | Status: DC

## 2018-05-28 NOTE — Telephone Encounter (Signed)
Wellbutrin refill request.

## 2018-06-06 ENCOUNTER — Other Ambulatory Visit: Payer: Self-pay | Admitting: Internal Medicine

## 2018-06-06 DIAGNOSIS — F329 Major depressive disorder, single episode, unspecified: Secondary | ICD-10-CM

## 2018-06-06 DIAGNOSIS — Z79899 Other long term (current) drug therapy: Secondary | ICD-10-CM

## 2018-06-06 DIAGNOSIS — F32A Depression, unspecified: Secondary | ICD-10-CM

## 2018-06-07 ENCOUNTER — Other Ambulatory Visit: Payer: Self-pay

## 2018-06-07 MED ORDER — BUSPIRONE HCL 10 MG PO TABS: ORAL_TABLET | ORAL | 0 refills | Status: DC

## 2018-06-07 NOTE — Telephone Encounter (Signed)
Alprazolam refill request denied due to fill date to soon. Per provider patient was asked if she would like a prescription for Buspar, 10mg , take 1/2-1 tablet up to three times a day. Patient agrees to try this.

## 2018-06-27 ENCOUNTER — Ambulatory Visit: Payer: Commercial Managed Care - PPO | Admitting: Internal Medicine

## 2018-06-27 ENCOUNTER — Other Ambulatory Visit: Payer: Self-pay

## 2018-06-27 ENCOUNTER — Encounter: Payer: Self-pay | Admitting: Internal Medicine

## 2018-06-27 VITALS — BP 130/85 | HR 99 | Temp 97.5°F | Wt 175.0 lb

## 2018-06-27 DIAGNOSIS — F329 Major depressive disorder, single episode, unspecified: Secondary | ICD-10-CM

## 2018-06-27 DIAGNOSIS — F32A Depression, unspecified: Secondary | ICD-10-CM

## 2018-06-27 DIAGNOSIS — K219 Gastro-esophageal reflux disease without esophagitis: Secondary | ICD-10-CM

## 2018-06-27 DIAGNOSIS — R7309 Other abnormal glucose: Secondary | ICD-10-CM

## 2018-06-27 DIAGNOSIS — Z79899 Other long term (current) drug therapy: Secondary | ICD-10-CM

## 2018-06-27 DIAGNOSIS — E559 Vitamin D deficiency, unspecified: Secondary | ICD-10-CM | POA: Diagnosis not present

## 2018-06-27 DIAGNOSIS — E782 Mixed hyperlipidemia: Secondary | ICD-10-CM

## 2018-06-27 DIAGNOSIS — I10 Essential (primary) hypertension: Secondary | ICD-10-CM

## 2018-06-27 DIAGNOSIS — F419 Anxiety disorder, unspecified: Secondary | ICD-10-CM

## 2018-06-27 NOTE — Progress Notes (Signed)
THIS ENCOUNTER IS A VIRTUAL VISIT DUE TO COVID-19 - PATIENT WAS NOT SEEN IN THE OFFICE.  PATIENT HAS CONSENTED TO VIRTUAL VISIT / TELEMEDICINE VISIT  This provider placed a call to Erin Nichols using telephone, her appointment was changed to a virtual office visit to reduce the risk of exposure to the COVID-19 virus and to help Erin Nichols remain healthy and safe. The virtual visit will also provide continuity of care. She verbalizes understanding.   Virtual Visit via telephone Note  I connected with  Erin Nichols  on 06/27/18  by telephone.  I verified that I am speaking with the correct person using two identifiers.        I discussed the limitations of evaluation and management by telemedicine and the availability of in person appointments. The patient expressed understanding and agreed to proceed.  History of Present Illness:      This very nice 53 y.o. MWF presents for 6 month follow up with HTN, HLD, Pre-Diabetes and Vitamin D Deficiency. Patient is on Nexium hx/o GERD and also has hx/o environmental allergies.      Patient is treated for HTN (2012) & BP has been controlled at home. Today's BP is at goal - 130/85. Patient has had no complaints of any cardiac type chest pain, palpitations, dyspnea / orthopnea / PND, dizziness, claudication, or dependent edema.      Hyperlipidemia is controlled with diet & Atorvastatin. Patient denies myalgias or other med SE's. Last Lipids were at goal: Lab Results  Component Value Date   CHOL 150 03/22/2018   HDL 54 03/22/2018   LDLCALC 73 03/22/2018   TRIG 143 03/22/2018   CHOLHDL 2.8 03/22/2018       Also, the patient has Obesity (BMI 30+) and is followed expectantly for PreDiabetes. She has had no symptoms of reactive hypoglycemia, diabetic polys, paresthesias or visual blurring.  Last A1c was Normal & at goal: Lab Results  Component Value Date   HGBA1C 5.3 12/14/2017      Further, the patient also has history of  Vitamin D Deficiency ("27" / 2015)  and supplements vitamin D without any suspected side-effects. Last vitamin D was low (goal 70-100): Lab Results  Component Value Date   VD25OH 52 12/14/2017   Current Outpatient Medications on File Prior to Visit  Medication Sig  . aspirin 81 MG chewable tablet Chew by mouth daily.  Marland Kitchen atorvastatin (LIPITOR) 20 MG tablet TAKE 1 TABLET BY MOUTH ONCE DAILY  . buPROPion (WELLBUTRIN XL) 300 MG 24 hr tablet Take 1 tablet (300 mg total) by mouth every morning.  . busPIRone (BUSPAR) 10 MG tablet Take 1/2-1 tablet up to three times a day for anxiety.  . calcium citrate (CALCITRATE - DOSED IN MG ELEMENTAL CALCIUM) 950 MG tablet Take 200 mg of elemental calcium by mouth daily.  . citalopram (CELEXA) 20 MG tablet TAKE 1 TABLET BY MOUTH ONCE DAILY  . cyclobenzaprine (FLEXERIL) 10 MG tablet Take 1/2 to 1 tablet up to 3 x /day ONLY if needed for Muscle Spasm  . esomeprazole (NEXIUM) 40 MG capsule Take 40 mg by mouth daily at 12 noon.  Marland Kitchen Fexofenadine HCl (ALLEGRA PO) Take by mouth.  . gabapentin (NEURONTIN) 300 MG capsule Take 1 capsule (300 mg total) by mouth daily.  Marland Kitchen ibuprofen (ADVIL,MOTRIN) 600 MG tablet TAKE 1 TABLET BY MOUTH EVERY 6 HOURS AS NEEDED  . montelukast (SINGULAIR) 10 MG tablet TAKE 1 TABLET BY MOUTH ONCE DAILY  .  Prenatal Vit-Fe Fumarate-FA (M-VIT PO) Take 1 tablet by mouth daily.  Marland Kitchen PROAIR HFA 108 (90 Base) MCG/ACT inhaler INHALE 1 PUFF PO Q 4 H (Patient taking differently: INHALE 1 PUFF PO Q 4 H PRN)  . promethazine-dextromethorphan (PROMETHAZINE-DM) 6.25-15 MG/5ML syrup TAKE 5 TO 10 ML BY MOUTH EVERY 4 HOURS IF NEEDED FOR COUGH  . vitamin B-12 (CYANOCOBALAMIN) 100 MCG tablet Take 100 mcg by mouth daily.   . Vitamin D, Ergocalciferol, (DRISDOL) 50000 units CAPS capsule TK ONE C PO ONCE A WEEK   No current facility-administered medications on file prior to visit.    No Known Allergies   PMHx:   Past Medical History:  Diagnosis Date  . Allergy   .  Asthma   . GERD (gastroesophageal reflux disease) 2005  . 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 Hyperlipidemia 2012  . Hypertension 2012  . Vitamin D deficiency    Immunization History  Administered Date(s) Administered  . Influenza Inj Mdck Quad With Preservative 11/08/2016  . Influenza,inj,Quad PF,6+ Mos 10/27/2017  . PPD Test 11/08/2016, 12/14/2017  . Pneumococcal-Unspecified 02/22/2015  . Td 02/22/2012  . Zoster 02/22/2015   Past Surgical History:  Procedure Laterality Date  . COLONOSCOPY N/A 2015   screening at age 27 - Negative  . FRACTURE SURGERY  1988   facial Fx , nose, Ankle both Arms  . l ulnar nonunion  1992  . NASAL SEPTUM SURGERY  1991  . nonunion 5th metatarsal  2008  . r sub talar fusion  2008  . redo subtalar fusion  2013  . rt ankle surg for non union  2013  . SPINE SURGERY  2011   Cx5 - Cx6 fusion w/plate/screws  . TONSILLECTOMY AND ADENOIDECTOMY  1972   FHx:    Reviewed / unchanged SHx:    Reviewed / unchanged   Systems Review:  Constitutional: Denies fever, chills, wt changes, headaches, insomnia, fatigue, night sweats, change in appetite. Eyes: Denies redness, blurred vision, diplopia, discharge, itchy, watery eyes.  ENT: Denies discharge, congestion, post nasal drip, epistaxis, sore throat, earache, hearing loss, dental pain, tinnitus, vertigo, sinus pain, snoring.  CV: Denies chest pain, palpitations, irregular heartbeat, syncope, dyspnea, diaphoresis, orthopnea, PND, claudication or edema. Respiratory: denies cough, dyspnea, DOE, pleurisy, hoarseness, laryngitis, wheezing.  Gastrointestinal: Denies dysphagia, odynophagia, heartburn, reflux, water brash, abdominal pain or cramps, nausea, vomiting, bloating, diarrhea, constipation, hematemesis, melena, hematochezia  or hemorrhoids. Genitourinary: Denies dysuria, frequency, urgency, nocturia, hesitancy, discharge, hematuria or flank pain. Musculoskeletal: Denies arthralgias, myalgias,  stiffness, jt. swelling, pain, limping or strain/sprain.  Skin: Denies pruritus, rash, hives, warts, acne, eczema or change in skin lesion(s). Neuro: No weakness, tremor, incoordination, spasms, paresthesia or pain. Psychiatric: Denies confusion, memory loss or sensory loss. Endo: Denies change in weight, skin or hair change.  Heme/Lymph: No excessive bleeding, bruising or enlarged lymph nodes.  Physical Exam  BP 130/85   Pulse 99   Temp (!) 97.5 F (36.4 C)   Wt 175 lb (79.4 kg)   BMI 31.00 kg/m   Appears  well nourished, well groomed  and in no distress.  Eyes: PERRLA, EOMs, conjunctiva no swelling or erythema. Sinuses: No frontal/maxillary tenderness ENT/Mouth: EAC's clear, TM's nl w/o erythema, bulging. Nares clear w/o erythema, swelling, exudates. Oropharynx clear without erythema or exudates. Oral hygiene is good. Tongue normal, non obstructing. Hearing intact.  Neck: Supple. Thyroid not palpable. Car 2+/2+ without bruits, nodes or JVD. Chest: Respirations nl with BS clear & equal w/o  rales, rhonchi, wheezing or stridor.  Cor: Heart sounds normal w/ regular rate and rhythm without sig. murmurs, gallops, clicks or rubs. Peripheral pulses normal and equal  without edema.  Abdomen: Soft & bowel sounds normal. Non-tender w/o guarding, rebound, hernias, masses or organomegaly.  Lymphatics: Unremarkable.  Musculoskeletal: Full ROM all peripheral extremities, joint stability, 5/5 strength and normal gait.  Skin: Warm, dry without exposed rashes, lesions or ecchymosis apparent.  Neuro: Cranial nerves intact, reflexes equal bilaterally. Sensory-motor testing grossly intact. Tendon reflexes grossly intact.  Pysch: Alert & oriented x 3.  Insight and judgement nl & appropriate. No ideations.  Assessment and Plan:  1. Essential hypertension  - Continue medication, monitor blood pressure at home.  - Continue DASH diet.  Reminder to go to the ER if any CP,  SOB, nausea, dizziness, severe  HA, changes vision/speech.  - CBC with Differential/Platelet; Future - COMPLETE METABOLIC PANEL WITH GFR; Future - Magnesium; Future - TSH; Future  2. Hyperlipidemia, mixed  - Continue diet/meds, exercise,& lifestyle modifications.  - Continue monitor periodic cholesterol/liver & renal functions   - Lipid panel; Future - TSH; Future  3. Abnormal glucose  - Continue diet, exercise  - Lifestyle modifications.  - Monitor appropriate labs.  - Hemoglobin A1c; Future - Insulin, random; Future  4. Vitamin D deficiency  - Continue supplementation.   - VITAMIN D 25 Hydroxy (Vit-D Deficiency, Fractures); Future  5. Gastroesophageal reflux disease  - CBC with Differential/Platelet; Future  6. Chronic anxiety   7. Depression, controlled   8. Medication management  - CBC with Differential/Platelet; Future - COMPLETE METABOLIC PANEL WITH GFR; Future - Magnesium; Future - Lipid panel; Future - TSH; Future - Hemoglobin A1c; Future - Insulin, random; Future - VITAMIN D 25 Hydroxy (Vit-D Deficiency, Fractures); Future      Discussed  regular exercise, BP monitoring, weight control to achieve/maintain BMI less than 25 and discussed med and SE's. Recommended labs to assess and monitor clinical status with further disposition pending results of labs. I discussed the assessment and treatment plan with the patient. The patient was provided an opportunity to ask questions and all were answered. The patient agreed with the plan and demonstrated an understanding of the instructions. I provided  26 minutes of non-face-to-face time during this encounter and over 34 minutes of exam, counseling, chart review and  complex critical decision making was performed   Kirtland Bouchard, MD

## 2018-06-28 ENCOUNTER — Other Ambulatory Visit: Payer: Commercial Managed Care - PPO

## 2018-07-02 ENCOUNTER — Other Ambulatory Visit: Payer: Self-pay

## 2018-07-02 ENCOUNTER — Other Ambulatory Visit: Payer: Commercial Managed Care - PPO

## 2018-07-02 DIAGNOSIS — Z79899 Other long term (current) drug therapy: Secondary | ICD-10-CM

## 2018-07-02 DIAGNOSIS — R7309 Other abnormal glucose: Secondary | ICD-10-CM

## 2018-07-02 DIAGNOSIS — I10 Essential (primary) hypertension: Secondary | ICD-10-CM | POA: Diagnosis not present

## 2018-07-02 DIAGNOSIS — E782 Mixed hyperlipidemia: Secondary | ICD-10-CM

## 2018-07-02 DIAGNOSIS — E559 Vitamin D deficiency, unspecified: Secondary | ICD-10-CM

## 2018-07-02 DIAGNOSIS — K219 Gastro-esophageal reflux disease without esophagitis: Secondary | ICD-10-CM

## 2018-07-03 LAB — COMPLETE METABOLIC PANEL WITH GFR
AG Ratio: 1.8 (calc) (ref 1.0–2.5)
ALT: 14 U/L (ref 6–29)
AST: 17 U/L (ref 10–35)
Albumin: 4.4 g/dL (ref 3.6–5.1)
Alkaline phosphatase (APISO): 154 U/L — ABNORMAL HIGH (ref 37–153)
BUN: 17 mg/dL (ref 7–25)
CO2: 31 mmol/L (ref 20–32)
Calcium: 9.1 mg/dL (ref 8.6–10.4)
Chloride: 104 mmol/L (ref 98–110)
Creat: 0.86 mg/dL (ref 0.50–1.05)
GFR, Est African American: 89 mL/min/{1.73_m2} (ref >=60)
GFR, Est Non African American: 77 mL/min/{1.73_m2} (ref >=60)
Globulin: 2.4 g/dL (calc) (ref 1.9–3.7)
Glucose, Bld: 95 mg/dL (ref 65–99)
Potassium: 3.9 mmol/L (ref 3.5–5.3)
Sodium: 141 mmol/L (ref 135–146)
Total Bilirubin: 0.4 mg/dL (ref 0.2–1.2)
Total Protein: 6.8 g/dL (ref 6.1–8.1)

## 2018-07-03 LAB — CBC WITH DIFFERENTIAL/PLATELET
Absolute Monocytes: 331 cells/uL (ref 200–950)
Basophils Absolute: 51 cells/uL (ref 0–200)
Basophils Relative: 1.1 %
Eosinophils Absolute: 51 cells/uL (ref 15–500)
Eosinophils Relative: 1.1 %
HCT: 39.2 % (ref 35.0–45.0)
Hemoglobin: 13.4 g/dL (ref 11.7–15.5)
Lymphs Abs: 1049 cells/uL (ref 850–3900)
MCH: 31.2 pg (ref 27.0–33.0)
MCHC: 34.2 g/dL (ref 32.0–36.0)
MCV: 91.4 fL (ref 80.0–100.0)
MPV: 11.2 fL (ref 7.5–12.5)
Monocytes Relative: 7.2 %
Neutro Abs: 3119 cells/uL (ref 1500–7800)
Neutrophils Relative %: 67.8 %
Platelets: 223 10*3/uL (ref 140–400)
RBC: 4.29 10*6/uL (ref 3.80–5.10)
RDW: 12.8 % (ref 11.0–15.0)
Total Lymphocyte: 22.8 %
WBC: 4.6 10*3/uL (ref 3.8–10.8)

## 2018-07-03 LAB — LIPID PANEL
Cholesterol: 145 mg/dL (ref <200)
HDL: 45 mg/dL — ABNORMAL LOW (ref >=50)
LDL Cholesterol (Calc): 70 mg/dL (calc)
Non-HDL Cholesterol (Calc): 100 mg/dL (calc) (ref <130)
Total CHOL/HDL Ratio: 3.2 (calc) (ref <5.0)
Triglycerides: 207 mg/dL — ABNORMAL HIGH (ref <150)

## 2018-07-03 LAB — TSH: TSH: 1.55 mIU/L

## 2018-07-03 LAB — HEMOGLOBIN A1C
Hgb A1c MFr Bld: 5.4 % of total Hgb (ref <5.7)
Mean Plasma Glucose: 108 (calc)
eAG (mmol/L): 6 (calc)

## 2018-07-03 LAB — MAGNESIUM: Magnesium: 2.2 mg/dL (ref 1.5–2.5)

## 2018-07-03 LAB — VITAMIN D 25 HYDROXY (VIT D DEFICIENCY, FRACTURES): Vit D, 25-Hydroxy: 34 ng/mL (ref 30–100)

## 2018-07-03 LAB — INSULIN, RANDOM: Insulin: 24.5 u[IU]/mL — ABNORMAL HIGH

## 2018-07-05 ENCOUNTER — Telehealth: Payer: Self-pay | Admitting: *Deleted

## 2018-07-05 NOTE — Telephone Encounter (Signed)
A message was left to inform the patient of Dr Idell Pickles response to her request to restart Alprazolam.  Dr Melford Aase recommends the patient continue the Buspar 10 mg and increase the Citalopram 20 mg to 2 tablets daily to equal 40 mg.  He advised, since the patient is taking Flexeril and Gabapentin, which are sedating medications, she try the increased dose of Citalopram and was advised to call for a refill, when the current RX runs out.

## 2018-07-08 ENCOUNTER — Other Ambulatory Visit: Payer: Self-pay | Admitting: Internal Medicine

## 2018-07-08 DIAGNOSIS — M542 Cervicalgia: Secondary | ICD-10-CM

## 2018-07-08 MED ORDER — CYCLOBENZAPRINE HCL 10 MG PO TABS: ORAL_TABLET | ORAL | 0 refills | Status: DC

## 2018-07-11 ENCOUNTER — Other Ambulatory Visit: Payer: Self-pay | Admitting: Internal Medicine

## 2018-07-11 DIAGNOSIS — F419 Anxiety disorder, unspecified: Secondary | ICD-10-CM

## 2018-07-11 MED ORDER — BUSPIRONE HCL 15 MG PO TABS: ORAL_TABLET | ORAL | 0 refills | Status: DC

## 2018-07-13 DIAGNOSIS — H539 Unspecified visual disturbance: Secondary | ICD-10-CM | POA: Diagnosis not present

## 2018-08-11 ENCOUNTER — Other Ambulatory Visit: Payer: Self-pay | Admitting: Internal Medicine

## 2018-08-11 DIAGNOSIS — F329 Major depressive disorder, single episode, unspecified: Secondary | ICD-10-CM

## 2018-08-11 DIAGNOSIS — M542 Cervicalgia: Secondary | ICD-10-CM

## 2018-08-11 DIAGNOSIS — F32A Depression, unspecified: Secondary | ICD-10-CM

## 2018-08-11 MED ORDER — CITALOPRAM HYDROBROMIDE 40 MG PO TABS: ORAL_TABLET | ORAL | 1 refills | Status: DC

## 2018-08-11 MED ORDER — CYCLOBENZAPRINE HCL 10 MG PO TABS: ORAL_TABLET | ORAL | 0 refills | Status: DC

## 2018-08-20 ENCOUNTER — Other Ambulatory Visit: Payer: Self-pay | Admitting: *Deleted

## 2018-10-01 NOTE — Progress Notes (Signed)
FOLLOW UP  Assessment and Plan:   Hypertension Well controlled with current medications  Monitor blood pressure at home; patient to call if consistently greater than 130/80 Continue DASH diet.   Reminder to go to the ER if any CP, SOB, nausea, dizziness, severe HA, changes vision/speech, left arm numbness and tingling and jaw pain.  Cholesterol At goal on statin; continue medication  Continue low cholesterol diet and exercise.  Check lipid panel.   Other abnormal glucose Discussed risks associated with elevated glucose Recommend prudent diet, portion control, maintenance of weight in normal range, regular aerobic exercise Defer A1C; check annually - monitor sugars by CMP  GERD Improved control with nexium; not a candidate for H2i at this time, monitor magnesium Discussed diet, avoiding triggers and other lifestyle changes  Obesity Long discussion about weight loss, diet, and exercise Recommended diet heavy in fruits and veggies and low in animal meats, cheeses, and dairy products, appropriate calorie intake Discussed appropriate weight for height, initial goal set for <170lb Follow up in 3 months  Depression/anxiety Well controlled by current regimen; uses xanax sparingly Lifestyle discussed: diet/exerise, sleep hygiene, stress management, hydration  Vitamin D Def/ osteoporosis prevention Near goal at recent check; she has not changed dose Suggested she try 10000 IU daily  Defer vitamin D level to next visit    Continue diet and meds as discussed. Further disposition pending results of labs. Discussed med's effects and SE's.   Over 30 minutes of exam, counseling, chart review, and critical decision making was performed.   Future Appointments  Date Time Provider [place]  01/03/2019 10:00 AM Unk Pinto, MD [facility] None    ----------------------------------------------------------------------------------------------------------------------  HPI 53  y.o. female  presents for 3 month follow up on hypertension, cholesterol, GERD, blood sugar monitoring, weight, depression and vitamin D deficiency.   BMI is Body mass index is 31.53 kg/m., she has been working on diet and exercise.  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  Wt Readings from Last 3 Encounters:  10/02/18 178 lb (80.7 kg)  06/27/18 175 lb (79.4 kg)  03/22/18 182 lb (82.6 kg)   she has ongoing depression with anxious features and is currently on celexa 20 mg daily, wellbutrin XR 300 mg daily, and she is on buspar 15mg  once every 2-3 days, reports symptoms are well controlled on current regimen.   she has a diagnosis of GERD which is currently managed by nexium 40 mg   Her blood pressure has been controlled at home, today their BP is BP: 122/86  She does workout. She denies chest pain, shortness of breath, dizziness.   She is on cholesterol medication (atorvastatin 20 mg daily) and denies myalgias. Her cholesterol is at goal. The cholesterol last visit was:   Lab Results  Component Value Date   CHOL 145 07/02/2018   HDL 45 (L) 07/02/2018   LDLCALC 70 07/02/2018   TRIG 207 (H) 07/02/2018   CHOLHDL 3.2 07/02/2018    She has been working on diet and exercise for blood sugar control, and denies increased appetite, nausea, paresthesia of the feet, polydipsia, polyuria, visual disturbances and vomiting. Last A1C in the office was:  Lab Results  Component Value Date   HGBA1C 5.4 07/02/2018   Patient is on Vitamin D supplement  Lab Results  Component Value Date   VD25OH 34 07/02/2018      Current Medications:  Current Outpatient Medications on File Prior to Visit  Medication Sig  . aspirin 81  MG chewable tablet Chew by mouth daily.  Marland Kitchen atorvastatin (LIPITOR) 20 MG tablet TAKE 1 TABLET BY MOUTH ONCE DAILY  . buPROPion (WELLBUTRIN XL) 300 MG 24 hr tablet Take 1 tablet (300 mg total) by mouth every morning.  . busPIRone (BUSPAR) 15 MG tablet Take 1 tablet 3  /day for anxiety.  . calcium citrate (CALCITRATE - DOSED IN MG ELEMENTAL CALCIUM) 950 MG tablet Take 200 mg of elemental calcium by mouth daily.  . citalopram (CELEXA) 40 MG tablet Take 1 tablet Daily for Anxiety & Mood  . cyclobenzaprine (FLEXERIL) 10 MG tablet Take 1/2 to 1 tablet up to 3 x /day ONLY if needed for Muscle Spasm  . esomeprazole (NEXIUM) 40 MG capsule Take 40 mg by mouth daily at 12 noon.  Marland Kitchen Fexofenadine HCl (ALLEGRA PO) Take by mouth.  . gabapentin (NEURONTIN) 300 MG capsule Take 1 capsule (300 mg total) by mouth daily.  Marland Kitchen ibuprofen (ADVIL,MOTRIN) 600 MG tablet TAKE 1 TABLET BY MOUTH EVERY 6 HOURS AS NEEDED  . montelukast (SINGULAIR) 10 MG tablet TAKE 1 TABLET BY MOUTH ONCE DAILY  . Prenatal Vit-Fe Fumarate-FA (M-VIT PO) Take 1 tablet by mouth daily.  Marland Kitchen PROAIR HFA 108 (90 Base) MCG/ACT inhaler INHALE 1 PUFF PO Q 4 H (Patient taking differently: INHALE 1 PUFF PO Q 4 H PRN)  . promethazine-dextromethorphan (PROMETHAZINE-DM) 6.25-15 MG/5ML syrup TAKE 5 TO 10 ML BY MOUTH EVERY 4 HOURS IF NEEDED FOR COUGH  . vitamin B-12 (CYANOCOBALAMIN) 100 MCG tablet Take 100 mcg by mouth daily.   . Vitamin D, Ergocalciferol, (DRISDOL) 50000 units CAPS capsule TK ONE C PO ONCE A WEEK   No current facility-administered medications on file prior to visit.      Allergies: No Known Allergies   Medical History:  Past Medical History:  Diagnosis Date  . Allergy   . Asthma   . GERD (gastroesophageal reflux disease) 2005  . 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 Hyperlipidemia 2012  . Hypertension 2012  . Vitamin D deficiency    Family history- Reviewed and unchanged Social history- Reviewed and unchanged   Review of Systems:  Review of Systems  Constitutional: Negative for malaise/fatigue and weight loss.  HENT: Negative for hearing loss and tinnitus.   Eyes: Negative for blurred vision and double vision.  Respiratory: Positive for cough (dry cough, improved with nexium, seeing  ENT for this). Negative for shortness of breath and wheezing.   Cardiovascular: Negative for chest pain, palpitations, orthopnea, claudication and leg swelling.  Gastrointestinal: Negative for abdominal pain, blood in stool, constipation, diarrhea, heartburn, melena, nausea and vomiting.  Genitourinary: Negative.   Musculoskeletal: Negative for joint pain and myalgias.  Skin: Negative for rash.  Neurological: Negative for dizziness, tingling, sensory change, weakness and headaches.  Endo/Heme/Allergies: Negative for polydipsia.  Psychiatric/Behavioral: Negative for depression and substance abuse. The patient is nervous/anxious.   All other systems reviewed and are negative.    Physical Exam: BP 122/86   Pulse 99   Temp 98.1 F (36.7 C)   Wt 178 lb (80.7 kg)   SpO2 98%   BMI 31.53 kg/m  Wt Readings from Last 3 Encounters:  10/02/18 178 lb (80.7 kg)  06/27/18 175 lb (79.4 kg)  03/22/18 182 lb (82.6 kg)   General Appearance: Well nourished, in no apparent distress. Eyes: PERRLA, EOMs, conjunctiva no swelling or erythema Sinuses: No Frontal/maxillary tenderness ENT/Mouth: Ext aud canals clear, TMs without erythema, bulging. No erythema, swelling, or exudate on  post pharynx.  Tonsils not swollen or erythematous. Hearing normal.  Neck: Supple, thyroid normal.  Respiratory: Respiratory effort normal, BS equal bilaterally without rales, rhonchi, wheezing or stridor.  Cardio: RRR with no MRGs. Brisk peripheral pulses without edema.  Abdomen: Soft, + BS.  Non tender, no guarding, rebound, hernias, masses. Lymphatics: Non tender without lymphadenopathy.  Musculoskeletal: Full ROM, 5/5 strength, Normal gait Skin: Warm, dry without rashes, lesions, ecchymosis.  Neuro: Cranial nerves intact. No cerebellar symptoms.  Psych: Awake and oriented X 3, normal affect, Insight and Judgment appropriate.    Vicie Mutters, PA-C 11:39 AM [facility]

## 2018-10-02 ENCOUNTER — Ambulatory Visit (INDEPENDENT_AMBULATORY_CARE_PROVIDER_SITE_OTHER): Payer: Commercial Managed Care - PPO | Admitting: Physician Assistant

## 2018-10-02 ENCOUNTER — Encounter: Payer: Self-pay | Admitting: Physician Assistant

## 2018-10-02 ENCOUNTER — Other Ambulatory Visit: Payer: Self-pay

## 2018-10-02 VITALS — BP 122/86 | HR 99 | Temp 98.1°F | Wt 178.0 lb

## 2018-10-02 DIAGNOSIS — E559 Vitamin D deficiency, unspecified: Secondary | ICD-10-CM

## 2018-10-02 DIAGNOSIS — Z79899 Other long term (current) drug therapy: Secondary | ICD-10-CM

## 2018-10-02 DIAGNOSIS — F329 Major depressive disorder, single episode, unspecified: Secondary | ICD-10-CM

## 2018-10-02 DIAGNOSIS — I10 Essential (primary) hypertension: Secondary | ICD-10-CM | POA: Diagnosis not present

## 2018-10-02 DIAGNOSIS — R7309 Other abnormal glucose: Secondary | ICD-10-CM

## 2018-10-02 DIAGNOSIS — E782 Mixed hyperlipidemia: Secondary | ICD-10-CM

## 2018-10-02 DIAGNOSIS — F32A Depression, unspecified: Secondary | ICD-10-CM

## 2018-10-02 NOTE — Patient Instructions (Signed)
    When it comes to diets, agreement about the perfect plan isn't easy to find, even among the experts. Experts at the [employer] developed an idea known as the Healthy Eating Plate. Just imagine a plate divided into logical, healthy portions.  The emphasis is on diet quality:  Load up on vegetables and fruits - one-half of your plate: Aim for color and variety, and remember that potatoes don't count.  Go for whole grains - one-quarter of your plate: Whole wheat, barley, wheat berries, quinoa, oats, brown rice, and foods made with them. If you want pasta, go with whole wheat pasta.  Protein power - one-quarter of your plate: Fish, chicken, beans, and nuts are all healthy, versatile protein sources. Limit red meat.  The diet, however, does go beyond the plate, offering a few other suggestions.  Use healthy plant oils, such as olive, canola, soy, corn, sunflower and peanut. Check the labels, and avoid partially hydrogenated oil, which have unhealthy trans fats.  If you're thirsty, drink water. Coffee and tea are good in moderation, but skip sugary drinks and limit milk and dairy products to one or two daily servings.  The type of carbohydrate in the diet is more important than the amount. Some sources of carbohydrates, such as vegetables, fruits, whole grains, and beans-are healthier than others.  Finally, stay active.  Check out  Mini habits for weight loss book  2 apps for tracking food is myfitness pal  loseit OR can take picture of your food  

## 2018-10-03 LAB — CBC WITH DIFFERENTIAL/PLATELET
Absolute Monocytes: 400 cells/uL (ref 200–950)
Basophils Absolute: 52 cells/uL (ref 0–200)
Basophils Relative: 1.1 %
Eosinophils Absolute: 99 cells/uL (ref 15–500)
Eosinophils Relative: 2.1 %
HCT: 40.5 % (ref 35.0–45.0)
Hemoglobin: 13.6 g/dL (ref 11.7–15.5)
Lymphs Abs: 1128 cells/uL (ref 850–3900)
MCH: 30.7 pg (ref 27.0–33.0)
MCHC: 33.6 g/dL (ref 32.0–36.0)
MCV: 91.4 fL (ref 80.0–100.0)
MPV: 10.9 fL (ref 7.5–12.5)
Monocytes Relative: 8.5 %
Neutro Abs: 3022 cells/uL (ref 1500–7800)
Neutrophils Relative %: 64.3 %
Platelets: 228 10*3/uL (ref 140–400)
RBC: 4.43 10*6/uL (ref 3.80–5.10)
RDW: 12.6 % (ref 11.0–15.0)
Total Lymphocyte: 24 %
WBC: 4.7 10*3/uL (ref 3.8–10.8)

## 2018-10-03 LAB — LIPID PANEL
Cholesterol: 163 mg/dL (ref <200)
HDL: 41 mg/dL — ABNORMAL LOW (ref >=50)
LDL Cholesterol (Calc): 89 mg/dL (calc)
Non-HDL Cholesterol (Calc): 122 mg/dL (calc) (ref <130)
Total CHOL/HDL Ratio: 4 (calc) (ref <5.0)
Triglycerides: 237 mg/dL — ABNORMAL HIGH (ref <150)

## 2018-10-03 LAB — COMPLETE METABOLIC PANEL WITH GFR
AG Ratio: 1.7 (calc) (ref 1.0–2.5)
ALT: 14 U/L (ref 6–29)
AST: 19 U/L (ref 10–35)
Albumin: 4.4 g/dL (ref 3.6–5.1)
Alkaline phosphatase (APISO): 149 U/L (ref 37–153)
BUN: 16 mg/dL (ref 7–25)
CO2: 28 mmol/L (ref 20–32)
Calcium: 9.6 mg/dL (ref 8.6–10.4)
Chloride: 105 mmol/L (ref 98–110)
Creat: 0.96 mg/dL (ref 0.50–1.05)
GFR, Est African American: 77 mL/min/{1.73_m2} (ref >=60)
GFR, Est Non African American: 67 mL/min/{1.73_m2} (ref >=60)
Globulin: 2.6 g/dL (calc) (ref 1.9–3.7)
Glucose, Bld: 98 mg/dL (ref 65–99)
Potassium: 4.3 mmol/L (ref 3.5–5.3)
Sodium: 139 mmol/L (ref 135–146)
Total Bilirubin: 0.3 mg/dL (ref 0.2–1.2)
Total Protein: 7 g/dL (ref 6.1–8.1)

## 2018-10-03 LAB — MAGNESIUM: Magnesium: 2.3 mg/dL (ref 1.5–2.5)

## 2018-10-03 LAB — TSH: TSH: 1.58 mIU/L

## 2018-10-03 LAB — VITAMIN D 25 HYDROXY (VIT D DEFICIENCY, FRACTURES): Vit D, 25-Hydroxy: 48 ng/mL (ref 30–100)

## 2018-10-08 ENCOUNTER — Other Ambulatory Visit: Payer: Self-pay | Admitting: Internal Medicine

## 2018-10-08 DIAGNOSIS — Z1231 Encounter for screening mammogram for malignant neoplasm of breast: Secondary | ICD-10-CM

## 2018-10-09 ENCOUNTER — Ambulatory Visit
Admission: RE | Admit: 2018-10-09 | Discharge: 2018-10-09 | Disposition: A | Discharge status: Home / Self Care | Payer: Commercial Managed Care - PPO | Source: Ambulatory Visit | Attending: Internal Medicine | Admitting: Internal Medicine

## 2018-10-09 ENCOUNTER — Other Ambulatory Visit: Payer: Self-pay

## 2018-10-09 DIAGNOSIS — Z1231 Encounter for screening mammogram for malignant neoplasm of breast: Secondary | ICD-10-CM

## 2018-10-19 ENCOUNTER — Other Ambulatory Visit: Payer: Self-pay

## 2018-10-19 ENCOUNTER — Ambulatory Visit (INDEPENDENT_AMBULATORY_CARE_PROVIDER_SITE_OTHER)
Admission: RE | Admit: 2018-10-19 | Discharge: 2018-10-19 | Disposition: A | Discharge status: Home / Self Care | Payer: Commercial Managed Care - PPO | Source: Ambulatory Visit | Attending: Internal Medicine | Admitting: Internal Medicine

## 2018-10-19 DIAGNOSIS — R911 Solitary pulmonary nodule: Secondary | ICD-10-CM

## 2018-11-01 ENCOUNTER — Other Ambulatory Visit: Payer: Self-pay

## 2018-11-01 DIAGNOSIS — M542 Cervicalgia: Secondary | ICD-10-CM

## 2018-11-01 MED ORDER — CYCLOBENZAPRINE HCL 10 MG PO TABS: ORAL_TABLET | ORAL | 0 refills | Status: DC

## 2018-11-06 ENCOUNTER — Other Ambulatory Visit: Payer: Self-pay | Admitting: Internal Medicine

## 2018-11-06 DIAGNOSIS — E559 Vitamin D deficiency, unspecified: Secondary | ICD-10-CM

## 2018-11-06 DIAGNOSIS — J45909 Unspecified asthma, uncomplicated: Secondary | ICD-10-CM

## 2018-11-07 ENCOUNTER — Other Ambulatory Visit: Payer: Self-pay | Admitting: Internal Medicine

## 2018-11-07 ENCOUNTER — Inpatient Hospital Stay: Admission: RE | Admit: 2018-11-07 | Payer: Commercial Managed Care - PPO | Source: Ambulatory Visit

## 2018-11-07 DIAGNOSIS — F419 Anxiety disorder, unspecified: Secondary | ICD-10-CM

## 2018-11-07 DIAGNOSIS — E782 Mixed hyperlipidemia: Secondary | ICD-10-CM

## 2018-11-07 MED ORDER — BUSPIRONE HCL 15 MG PO TABS: ORAL_TABLET | ORAL | 1 refills | Status: DC

## 2018-11-07 MED ORDER — ATORVASTATIN CALCIUM 20 MG PO TABS: ORAL_TABLET | ORAL | 1 refills | Status: DC

## 2019-01-02 ENCOUNTER — Encounter: Payer: Self-pay | Admitting: Internal Medicine

## 2019-01-02 DIAGNOSIS — I700 Atherosclerosis of aorta: Secondary | ICD-10-CM | POA: Insufficient documentation

## 2019-01-02 NOTE — Patient Instructions (Addendum)
Vit D  & Vit C 1,000 mg   are recommended to help protect  against the Covid-19 and other Corona viruses.    Also it's recommended  to take  Zinc 50 mg  to help  protect against the Covid-19   and best place to get  is also on [email or web address]  and don't pay more than 6-8 cents /pill !  ================================ Coronavirus (COVID-19) Are you at risk?  Are you at risk for the Coronavirus (COVID-19)?  To be considered HIGH RISK for Coronavirus (COVID-19), you have to meet the following criteria:  . Traveled to [place], [place], [place], [place] or [place]; or in the [place] to [place], [place], [place]  . or [place]; and have fever, cough, and shortness of breath within the last 2 weeks of travel OR . Been in close contact with a person diagnosed with COVID-19 within the last 2 weeks and have  . fever, cough,and shortness of breath .  . IF YOU DO NOT MEET THESE CRITERIA, YOU ARE CONSIDERED LOW RISK FOR COVID-19.  What to do if you are HIGH RISK for COVID-19?  Marland Kitchen If you are having a medical emergency, call 911. . Seek medical care right away. Before you go to a doctor's office, urgent care or emergency department, .  call ahead and tell them about your recent travel, contact with someone diagnosed with COVID-19  .  and your symptoms.  . You should receive instructions from your physician's office regarding next steps of care.  . When you arrive at healthcare provider, tell the healthcare staff immediately you have returned from  . visiting [place], [place], [place], [place] or [place]; or traveled in the [place] to [place], [place],  . [place] or [place] in the last two weeks or you have been in close contact with a person diagnosed with  . COVID-19 in the last 2 weeks.   . Tell the health care staff about your symptoms: fever, cough and shortness of breath. . After you have been seen by a medical provider, you will be either: o Tested for (COVID-19) and  discharged home on quarantine except to seek medical care if  o symptoms worsen, and asked to  - Stay home and avoid contact with others until you get your results (4-5 days)  - Avoid travel on public transportation if possible (such as bus, train, or airplane) or o Sent to the Emergency Department by EMS for evaluation, COVID-19 testing  and  o possible admission depending on your condition and test results.  What to do if you are LOW RISK for COVID-19?  Reduce your risk of any infection by using the same precautions used for avoiding the common cold or flu:  Marland Kitchen Wash your hands often with soap and warm water for at least 20 seconds.  If soap and water are not readily available,  . use an alcohol-based hand sanitizer with at least 60% alcohol.  . If coughing or sneezing, cover your mouth and nose by coughing or sneezing into the elbow areas of your shirt or coat, .  into a tissue or into your sleeve (not your hands). . Avoid shaking hands with others and consider head nods or verbal greetings only. . Avoid touching your eyes, nose, or mouth with unwashed hands.  . Avoid close contact with people who are sick. . Avoid places or events with large numbers of people in one location, like concerts or sporting events. Marland Kitchen  Carefully consider travel plans you have or are making. . If you are planning any travel outside or inside the [place], visit the CDC's Travelers' Health webpage for the latest health notices. . If you have some symptoms but not all symptoms, continue to monitor at home and seek medical attention  . if your symptoms worsen. . If you are having a medical emergency, call 911. >>>>>>>>>>>>>>>>>>>>>>> Preventive Care for Adults  A healthy lifestyle and preventive care can promote health and wellness. Preventive health guidelines for women include the following key practices.  A routine yearly physical is a good way to check with your health care provider about your health and preventive  screening. It is a chance to share any concerns and updates on your health and to receive a thorough exam.  Visit your dentist for a routine exam and preventive care every 6 months. Brush your teeth twice a day and floss once a day. Good oral hygiene prevents tooth decay and gum disease.  The frequency of eye exams is based on your age, health, family medical history, use of contact lenses, and other factors. Follow your health care provider's recommendations for frequency of eye exams.  Eat a healthy diet. Foods like vegetables, fruits, whole grains, low-fat dairy products, and lean protein foods contain the nutrients you need without too many calories. Decrease your intake of foods high in solid fats, added sugars, and salt. Eat the right amount of calories for you. Get information about a proper diet from your health care provider, if necessary.  Regular physical exercise is one of the most important things you can do for your health. Most adults should get at least 150 minutes of moderate-intensity exercise (any activity that increases your heart rate and causes you to sweat) each week. In addition, most adults need muscle-strengthening exercises on 2 or more days a week.  Maintain a healthy weight. The body mass index (BMI) is a screening tool to identify possible weight problems. It provides an estimate of body fat based on height and weight. Your health care provider can find your BMI and can help you achieve or maintain a healthy weight. For adults 20 years and older:  A BMI below 18.5 is considered underweight.  A BMI of 18.5 to 24.9 is normal.  A BMI of 25 to 29.9 is considered overweight.  A BMI of 30 and above is considered obese.  Maintain normal blood lipids and cholesterol levels by exercising and minimizing your intake of saturated fat. Eat a balanced diet with plenty of fruit and vegetables. Blood tests for lipids and cholesterol should begin at age 39 and be repeated every 5  years. If your lipid or cholesterol levels are high, you are over 50, or you are at high risk for heart disease, you may need your cholesterol levels checked more frequently. Ongoing high lipid and cholesterol levels should be treated with medicines if diet and exercise are not working.  If you smoke, find out from your health care provider how to quit. If you do not use tobacco, do not start.  Lung cancer screening is recommended for adults aged 41-80 years who are at high risk for developing lung cancer because of a history of smoking. A yearly low-dose CT scan of the lungs is recommended for people who have at least a 30-pack-year history of smoking and are a current smoker or have quit within the past 15 years. A pack year of smoking is smoking an average of 1 pack  of cigarettes a day for 1 year (for example: 1 pack a day for 30 years or 2 packs a day for 15 years). Yearly screening should continue until the smoker has stopped smoking for at least 15 years. Yearly screening should be stopped for people who develop a health problem that would prevent them from having lung cancer treatment.  High blood pressure causes heart disease and increases the risk of stroke. Your blood pressure should be checked at least every 1 to 2 years. Ongoing high blood pressure should be treated with medicines if weight loss and exercise do not work.  If you are 15-64 years old, ask your health care provider if you should take aspirin to prevent strokes.  Diabetes screening involves taking a blood sample to check your fasting blood sugar level. This should be done once every 3 years, after age 74, if you are within normal weight and without risk factors for diabetes. Testing should be considered at a younger age or be carried out more frequently if you are overweight and have at least 1 risk factor for diabetes.  Breast cancer screening is essential preventive care for women. You should practice "breast self-awareness."  This means understanding the normal appearance and feel of your breasts and may include breast self-examination. Any changes detected, no matter how small, should be reported to a health care provider. Women in their 97s and 30s should have a clinical breast exam (CBE) by a health care provider as part of a regular health exam every 1 to 3 years. After age 48, women should have a CBE every year. Starting at age 53, women should consider having a mammogram (breast X-ray test) every year. Women who have a family history of breast cancer should talk to their health care provider about genetic screening. Women at a high risk of breast cancer should talk to their health care providers about having an MRI and a mammogram every year.  Breast cancer gene (BRCA)-related cancer risk assessment is recommended for women who have family members with BRCA-related cancers. BRCA-related cancers include breast, ovarian, tubal, and peritoneal cancers. Having family members with these cancers may be associated with an increased risk for harmful changes (mutations) in the breast cancer genes BRCA1 and BRCA2. Results of the assessment will determine the need for genetic counseling and BRCA1 and BRCA2 testing.  Routine pelvic exams to screen for cancer are no longer recommended for nonpregnant women who are considered low risk for cancer of the pelvic organs (ovaries, uterus, and vagina) and who do not have symptoms. Ask your health care provider if a screening pelvic exam is right for you.  If you have had past treatment for cervical cancer or a condition that could lead to cancer, you need Pap tests and screening for cancer for at least 20 years after your treatment. If Pap tests have been discontinued, your risk factors (such as having a new sexual partner) need to be reassessed to determine if screening should be resumed. Some women have medical problems that increase the chance of getting cervical cancer. In these cases, your  health care provider may recommend more frequent screening and Pap tests.  Colorectal cancer can be detected and often prevented. Most routine colorectal cancer screening begins at the age of 24 years and continues through age 31 years. However, your health care provider may recommend screening at an earlier age if you have risk factors for colon cancer. On a yearly basis, your health care provider may provide home  test kits to check for hidden blood in the stool. Use of a small camera at the end of a tube, to directly examine the colon (sigmoidoscopy or colonoscopy), can detect the earliest forms of colorectal cancer. Talk to your health care provider about this at age 51, when routine screening begins.  Direct exam of the colon should be repeated every 5-10 years through age 77 years, unless early forms of pre-cancerous polyps or small growths are found.  Hepatitis C blood testing is recommended for all people born from [birth year] through 1965 and any individual with known risks for hepatitis C.  Pra  Osteoporosis is a disease in which the bones lose minerals and strength with aging. This can result in serious bone fractures or breaks. The risk of osteoporosis can be identified using a bone density scan. Women ages 35 years and over and women at risk for fractures or osteoporosis should discuss screening with their health care providers. Ask your health care provider whether you should take a calcium supplement or vitamin D to reduce the rate of osteoporosis.  Menopause can be associated with physical symptoms and risks. Hormone replacement therapy is available to decrease symptoms and risks. You should talk to your health care provider about whether hormone replacement therapy is right for you.  Use sunscreen. Apply sunscreen liberally and repeatedly throughout the day. You should seek shade when your shadow is shorter than you. Protect yourself by wearing long sleeves, pants, a wide-brimmed hat, and  sunglasses year round, whenever you are outdoors.  Once a month, do a whole body skin exam, using a mirror to look at the skin on your back. Tell your health care provider of new moles, moles that have irregular borders, moles that are larger than a pencil eraser, or moles that have changed in shape or color.  Stay current with required vaccines (immunizations).  Influenza vaccine. All adults should be immunized every year.  Tetanus, diphtheria, and acellular pertussis (Td, Tdap) vaccine. Pregnant women should receive 1 dose of Tdap vaccine during each pregnancy. The dose should be obtained regardless of the length of time since the last dose. Immunization is preferred during the 27th-36th week of gestation. An adult who has not previously received Tdap or who does not know her vaccine status should receive 1 dose of Tdap. This initial dose should be followed by tetanus and diphtheria toxoids (Td) booster doses every 10 years. Adults with an unknown or incomplete history of completing a 3-dose immunization series with Td-containing vaccines should begin or complete a primary immunization series including a Tdap dose. Adults should receive a Td booster every 10 years.  Varicella vaccine. An adult without evidence of immunity to varicella should receive 2 doses or a second dose if she has previously received 1 dose. Pregnant females who do not have evidence of immunity should receive the first dose after pregnancy. This first dose should be obtained before leaving the health care facility. The second dose should be obtained 4-8 weeks after the first dose.  Human papillomavirus (HPV) vaccine. Females aged 13-26 years who have not received the vaccine previously should obtain the 3-dose series. The vaccine is not recommended for use in pregnant females. However, pregnancy testing is not needed before receiving a dose. If a female is found to be pregnant after receiving a dose, no treatment is needed. In that  case, the remaining doses should be delayed until after the pregnancy. Immunization is recommended for any person with an immunocompromised condition through the  age of 39 years if she did not get any or all doses earlier. During the 3-dose series, the second dose should be obtained 4-8 weeks after the first dose. The third dose should be obtained 24 weeks after the first dose and 16 weeks after the second dose.  Zoster vaccine. One dose is recommended for adults aged 51 years or older unless certain conditions are present.  Measles, mumps, and rubella (MMR) vaccine. Adults born before [birth year] generally are considered immune to measles and mumps. Adults born in [birth year] or later should have 1 or more doses of MMR vaccine unless there is a contraindication to the vaccine or there is laboratory evidence of immunity to each of the three diseases. A routine second dose of MMR vaccine should be obtained at least 28 days after the first dose for students attending postsecondary schools, health care workers, or international travelers. People who received inactivated measles vaccine or an unknown type of measles vaccine during 1963-1967 should receive 2 doses of MMR vaccine. People who received inactivated mumps vaccine or an unknown type of mumps vaccine before 1979 and are at high risk for mumps infection should consider immunization with 2 doses of MMR vaccine. For females of childbearing age, rubella immunity should be determined. If there is no evidence of immunity, females who are not pregnant should be vaccinated. If there is no evidence of immunity, females who are pregnant should delay immunization until after pregnancy. Unvaccinated health care workers born before [birth year] who lack laboratory evidence of measles, mumps, or rubella immunity or laboratory confirmation of disease should consider measles and mumps immunization with 2 doses of MMR vaccine or rubella immunization with 1 dose of MMR vaccine.  Pneumococcal  13-valent conjugate (PCV13) vaccine. When indicated, a person who is uncertain of her immunization history and has no record of immunization should receive the PCV13 vaccine. An adult aged 54 years or older who has certain medical conditions and has not been previously immunized should receive 1 dose of PCV13 vaccine. This PCV13 should be followed with a dose of pneumococcal polysaccharide (PPSV23) vaccine. The PPSV23 vaccine dose should be obtained at least 1 or more year(s) after the dose of PCV13 vaccine. An adult aged 33 years or older who has certain medical conditions and previously received 1 or more doses of PPSV23 vaccine should receive 1 dose of PCV13. The PCV13 vaccine dose should be obtained 1 or more years after the last PPSV23 vaccine dose.    Pneumococcal polysaccharide (PPSV23) vaccine. When PCV13 is also indicated, PCV13 should be obtained first. All adults aged 77 years and older should be immunized. An adult younger than age 54 years who has certain medical conditions should be immunized. Any person who resides in a nursing home or long-term care facility should be immunized. An adult smoker should be immunized. People with an immunocompromised condition and certain other conditions should receive both PCV13 and PPSV23 vaccines. People with human immunodeficiency virus (HIV) infection should be immunized as soon as possible after diagnosis. Immunization during chemotherapy or radiation therapy should be avoided. Routine use of PPSV23 vaccine is not recommended for American Indians, [place] Natives, or people younger than 65 years unless there are medical conditions that require PPSV23 vaccine. When indicated, people who have unknown immunization and have no record of immunization should receive PPSV23 vaccine. One-time revaccination 5 years after the first dose of PPSV23 is recommended for people aged 19-64 years who have chronic kidney failure, nephrotic syndrome, asplenia, or  immunocompromised conditions.  People who received 1-2 doses of PPSV23 before age 21 years should receive another dose of PPSV23 vaccine at age 58 years or later if at least 5 years have passed since the previous dose. Doses of PPSV23 are not needed for people immunized with PPSV23 at or after age 45 years.  Preventive Services / Frequency   Ages 65 to 28 years  Blood pressure check.  Lipid and cholesterol check.  Lung cancer screening. / Every year if you are aged 22-80 years and have a 30-pack-year history of smoking and currently smoke or have quit within the past 15 years. Yearly screening is stopped once you have quit smoking for at least 15 years or develop a health problem that would prevent you from having lung cancer treatment.  Clinical breast exam.** / Every year after age 9 years.   BRCA-related cancer risk assessment.** / For women who have family members with a BRCA-related cancer (breast, ovarian, tubal, or peritoneal cancers).  Mammogram.** / Every year beginning at age 36 years and continuing for as long as you are in good health. Consult with your health care provider.  Pap test.** / Every 3 years starting at age 71 years through age 15 or 23 years with a history of 3 consecutive normal Pap tests.  HPV screening.** / Every 3 years from ages 57 years through ages 75 to 69 years with a history of 3 consecutive normal Pap tests.  Fecal occult blood test (FOBT) of stool. / Every year beginning at age 21 years and continuing until age 65 years. You may not need to do this test if you get a colonoscopy every 10 years.  Flexible sigmoidoscopy or colonoscopy.** / Every 5 years for a flexible sigmoidoscopy or every 10 years for a colonoscopy beginning at age 91 years and continuing until age 29 years.  Hepatitis C blood test.** / For all people born from [birth year] through 1967 and any individual with known risks for hepatitis C.  Skin self-exam. / Monthly.  Influenza vaccine. /  Every year.  Tetanus, diphtheria, and acellular pertussis (Tdap/Td) vaccine.** / Consult your health care provider. Pregnant women should receive 1 dose of Tdap vaccine during each pregnancy. 1 dose of Td every 10 years.  Varicella vaccine.** / Consult your health care provider. Pregnant females who do not have evidence of immunity should receive the first dose after pregnancy.  Zoster vaccine.** / 1 dose for adults aged 5 years or older.  Pneumococcal 13-valent conjugate (PCV13) vaccine.** / Consult your health care provider.  Pneumococcal polysaccharide (PPSV23) vaccine.** / 1 to 2 doses if you smoke cigarettes or if you have certain conditions.  Meningococcal vaccine.** / Consult your health care provider.  Hepatitis A vaccine.** / Consult your health care provider.  Hepatitis B vaccine.** / Consult your health care provider. Screening for abdominal aortic aneurysm (AAA)  by ultrasound is recommended for people over 50 who have history of high blood pressure or who are current or former smokers. ++++++++++++++++++ Recommend Adult Low Dose Aspirin or  coated  Aspirin 81 mg daily  To reduce risk of Colon Cancer 40 %,  Skin Cancer 26 % ,  Melanoma 46%  and  Pancreatic cancer 60% +++++++++++++++++++ Vitamin D goal  is between 70-100.  Please make sure that you are taking your Vitamin D as directed.  It is very important as a natural anti-inflammatory  helping hair, skin, and nails, as well as reducing stroke and heart attack risk.  It helps your bones and  helps with mood. It also decreases numerous cancer risks so please take it as directed.  Low Vit D is associated with a 200-300% higher risk for CANCER  and 200-300% higher risk for HEART   ATTACK  &  STROKE.   .....................................Marland Kitchen It is also associated with higher death rate at younger ages,  autoimmune diseases like Rheumatoid arthritis, Lupus, Multiple Sclerosis.    Also many other serious conditions, like  depression, Alzheimer's Dementia, infertility, muscle aches, fatigue, fibromyalgia - just to name a few. ++++++++++++++++++ Recommend the book "The END of DIETING" by Dr Excell Seltzer  & the book "The END of DIABETES " by Dr Excell Seltzer At [email or web address] - get book & Audio CD's    Being diabetic has a  300% increased risk for heart attack, stroke, cancer, and alzheimer- type vascular dementia. It is very important that you work harder with diet by avoiding all foods that are white. Avoid white rice (brown & wild rice is OK), white potatoes (sweetpotatoes in moderation is OK), White bread or wheat bread or anything made out of white flour like bagels, donuts, rolls, buns, biscuits, cakes, pastries, cookies, pizza crust, and pasta (made from white flour & egg whites) - vegetarian pasta or spinach or wheat pasta is OK. Multigrain breads like Arnold's or [employer], or multigrain sandwich thins or flatbreads.  Diet, exercise and weight loss can reverse and cure diabetes in the early stages.  Diet, exercise and weight loss is very important in the control and prevention of complications of diabetes which affects every system in your body, ie. Brain - dementia/stroke, eyes - glaucoma/blindness, heart - heart attack/heart failure, kidneys - dialysis, stomach - gastric paralysis, intestines - malabsorption, nerves - severe painful neuritis, circulation - gangrene & loss of a leg(s), and finally cancer and Alzheimers.    I recommend avoid fried & greasy foods,  sweets/candy, white rice (brown or wild rice or Quinoa is OK), white potatoes (sweet potatoes are OK) - anything made from white flour - bagels, doughnuts, rolls, buns, biscuits,white and wheat breads, pizza crust and traditional pasta made of white flour & egg white(vegetarian pasta or spinach or wheat pasta is OK).  Multi-grain bread is OK - like multi-grain flat bread or sandwich thins. Avoid alcohol in excess. Exercise is also important.    Eat all the  vegetables you want - avoid meat, especially red meat and dairy - especially cheese.  Cheese is the most concentrated form of trans-fats which is the worst thing to clog up our arteries. Veggie cheese is OK which can be found in the fresh produce section at [employer] or [employer] or [employer]  ++++++++++++++++++++++ DASH Eating Plan  DASH stands for "Dietary Approaches to Stop Hypertension."   The DASH eating plan is a healthy eating plan that has been shown to reduce high blood pressure (hypertension). Additional health benefits may include reducing the risk of type 2 diabetes mellitus, heart disease, and stroke. The DASH eating plan may also help with weight loss. WHAT DO I NEED TO KNOW ABOUT THE DASH EATING PLAN? For the DASH eating plan, you will follow these general guidelines:  Choose foods with a percent daily value for sodium of less than 5% (as listed on the food label).  Use salt-free seasonings or herbs instead of table salt or sea salt.  Check with your health care provider or pharmacist before using salt substitutes.  Eat lower-sodium products, often labeled as "lower sodium" or "no salt added."  Eat  fresh foods.  Eat more vegetables, fruits, and low-fat dairy products.  Choose whole grains. Look for the word "whole" as the first word in the ingredient list.  Choose fish   Limit sweets, desserts, sugars, and sugary drinks.  Choose heart-healthy fats.  Eat veggie cheese   Eat more home-cooked food and less restaurant, buffet, and fast food.  Limit fried foods.  Cook foods using methods other than frying.  Limit canned vegetables. If you do use them, rinse them well to decrease the sodium.  When eating at a restaurant, ask that your food be prepared with less salt, or no salt if possible.                      WHAT FOODS CAN I EAT? Read Dr Joel Fuhrman's books on The End of Dieting & The End of Diabetes  Grains Whole grain or whole wheat bread. Brown  rice. Whole grain or whole wheat pasta. Quinoa, bulgur, and whole grain cereals. Low-sodium cereals. Corn or whole wheat flour tortillas. Whole grain cornbread. Whole grain crackers. Low-sodium crackers.  Vegetables Fresh or frozen vegetables (raw, steamed, roasted, or grilled). Low-sodium or reduced-sodium tomato and vegetable juices. Low-sodium or reduced-sodium tomato sauce and paste. Low-sodium or reduced-sodium canned vegetables.   Fruits All fresh, canned (in natural juice), or frozen fruits.  Protein Products  All fish and seafood.  Dried beans, peas, or lentils. Unsalted nuts and seeds. Unsalted canned beans.  Dairy Low-fat dairy products, such as skim or 1% milk, 2% or reduced-fat cheeses, low-fat ricotta or cottage cheese, or plain low-fat yogurt. Low-sodium or reduced-sodium cheeses.  Fats and Oils Tub margarines without trans fats. Light or reduced-fat mayonnaise and salad dressings (reduced sodium). Avocado. Safflower, olive, or canola oils. Natural peanut or almond butter.  Other Unsalted popcorn and pretzels. The items listed above may not be a complete list of recommended foods or beverages. Contact your dietitian for more options.  ++++++++++++++++++  WHAT FOODS ARE NOT RECOMMENDED? Grains/ White flour or wheat flour White bread. White pasta. White rice. Refined cornbread. Bagels and croissants. Crackers that contain trans fat.  Vegetables  Creamed or fried vegetables. Vegetables in a . Regular canned vegetables. Regular canned tomato sauce and paste. Regular tomato and vegetable juices.  Fruits Dried fruits. Canned fruit in light or heavy syrup. Fruit juice.  Meat and Other Protein Products Meat in general - RED meat & White meat.  Fatty cuts of meat. Ribs, chicken wings, all processed meats as bacon, sausage, bologna, salami, fatback, hot dogs, bratwurst and packaged luncheon meats.  Dairy Whole or 2% milk, cream, half-and-half, and cream cheese. Whole-fat or  sweetened yogurt. Full-fat cheeses or blue cheese. Non-dairy creamers and whipped toppings. Processed cheese, cheese spreads, or cheese curds.  Condiments Onion and garlic salt, seasoned salt, table salt, and sea salt. Canned and packaged gravies. Worcestershire sauce. Tartar sauce. Barbecue sauce. Teriyaki sauce. Soy sauce, including reduced sodium. Steak sauce. Fish sauce. Oyster sauce. Cocktail sauce. Horseradish. Ketchup and mustard. Meat flavorings and tenderizers. Bouillon cubes. Hot sauce. Tabasco sauce. Marinades. Taco seasonings. Relishes.  Fats and Oils Butter, stick margarine, lard, shortening and bacon fat. Coconut, palm kernel, or palm oils. Regular salad dressings.  Pickles and olives. Salted popcorn and pretzels.  The items listed above may not be a complete list of foods and beverages to avoid.    

## 2019-01-02 NOTE — Progress Notes (Signed)
Annual Screening/Preventative Visit & Comprehensive Evaluation &  Examination     This very nice 53 y.o. MWF presents for a Screening /Preventative Visit & comprehensive evaluation and management of multiple medical co-morbidities.  Patient has been followed for HTN, HLD, Prediabetes  and Vitamin D Deficiency. Patient has GERD controlled on her meds.      HTN predates since 2012. Patient's BP has been controlled at home and patient denies any cardiac symptoms as chest pain, palpitations, shortness of breath, dizziness or ankle swelling. Today's BP is at goal - 114/82.      Patient's hyperlipidemia is controlled with diet and medications. Patient denies myalgias or other medication SE's. Last lipids were at goal albeit elevated Trig's:  Lab Results  Component Value Date   CHOL 163 10/02/2018   HDL 41 (L) 10/02/2018   LDLCALC 89 10/02/2018   TRIG 237 (H) 10/02/2018   CHOLHDL 4.0 10/02/2018      Patient is Overweight (BMI 30+) and is monitored expectantly for glucose intolerance and patient denies reactive hypoglycemic symptoms, visual blurring, diabetic polys or paresthesias. Last A1c was Normal & at goal:  Lab Results  Component Value Date   HGBA1C 5.4 07/02/2018      Finally, patient has history of Vitamin D Deficiency ("27" / 2015)  and last Vitamin D was sl low (goal 70-100):  Lab Results  Component Value Date   VD25OH 48 10/02/2018   Current Outpatient Medications on File Prior to Visit  Medication Sig  . aspirin 81 MG chewable tablet Chew by mouth daily.  Marland Kitchen atorvastatin (LIPITOR) 20 MG tablet Take 1 tablet Daily for Cholesterol  . buPROPion (WELLBUTRIN XL) 300 MG 24 hr tablet Take 1 tablet (300 mg total) by mouth every morning.  . busPIRone (BUSPAR) 15 MG tablet Take 1 tablet 3 x  /day for anxiety.  . calcium citrate (CALCITRATE - DOSED IN MG ELEMENTAL CALCIUM) 950 MG tablet Take 200 mg of elemental calcium by mouth daily.  . citalopram (CELEXA) 40 MG tablet Take 1 tablet  Daily for Anxiety & Mood  . cyclobenzaprine (FLEXERIL) 10 MG tablet Take 1/2 to 1 tablet up to 3 x /day ONLY if needed for Muscle Spasm  . Fexofenadine HCl (ALLEGRA PO) Take by mouth.  . gabapentin (NEURONTIN) 300 MG capsule TAKE 1 CAPSULE(300 MG) BY MOUTH DAILY  . ibuprofen (ADVIL,MOTRIN) 600 MG tablet TAKE 1 TABLET BY MOUTH EVERY 6 HOURS AS NEEDED  . Prenatal Vit-Fe Fumarate-FA (M-VIT PO) Take 1 tablet by mouth daily.  Marland Kitchen PROAIR HFA 108 (90 Base) MCG/ACT inhaler INHALE 1 PUFF PO Q 4 H (Patient taking differently: INHALE 1 PUFF PO Q 4 H PRN)  . promethazine-dextromethorphan (PROMETHAZINE-DM) 6.25-15 MG/5ML syrup TAKE 5 TO 10 ML BY MOUTH EVERY 4 HOURS IF NEEDED FOR COUGH  . vitamin B-12 (CYANOCOBALAMIN) 100 MCG tablet Take 100 mcg by mouth daily.   . Vitamin D, Ergocalciferol, (DRISDOL) 1.25 MG (50000 UT) CAPS capsule Take 1 capsule every week for severe Vit DDeficiency   No current facility-administered medications on file prior to visit.    No Known Allergies   Past Medical History:  Diagnosis Date  . Allergy   . Asthma   . GERD (gastroesophageal reflux disease) 2005  . 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 Hyperlipidemia 2012  . Hypertension 2012  . Vitamin D deficiency    Health Maintenance  Topic Date Due  . Hepatitis C Screening  1965-03-03  . HIV Screening  09/11/1978  .  PAP SMEAR-Modifier  09/10/1984  . INFLUENZA VACCINE  09/22/2018  . MAMMOGRAM  10/09/2019  . TETANUS/TDAP  02/21/2022  . COLONOSCOPY  01/01/2025   Immunization History  Administered Date(s) Administered  . Influenza Inj Mdck Quad With Preservative 11/08/2016  . Influenza,inj,Quad PF,6+ Mos 10/27/2017  . PPD Test 11/08/2016, 12/14/2017  . Pneumococcal-Unspecified 02/22/2015  . Td 02/22/2012  . Zoster 02/22/2015   Last Colon - 01/02/2015 - done in [place] - Negative - Recc 10 yr f/u - due Nov 2026  Last MGM - 10/09/2018   Past Surgical History:  Procedure Laterality Date  . COLONOSCOPY N/A  2015   screening at age 45 - Negative  . FRACTURE SURGERY  1988   facial Fx , nose, Ankle both Arms  . l ulnar nonunion  1992  . NASAL SEPTUM SURGERY  1991  . nonunion 5th metatarsal  2008  . r sub talar fusion  2008  . redo subtalar fusion  2013  . rt ankle surg for non union  2013  . SPINE SURGERY  2011   Cx5 - Cx6 fusion w/plate/screws  . TONSILLECTOMY AND ADENOIDECTOMY  1972   Family History  Problem Relation Age of Onset  . Diabetes Mother   . Heart disease Father   . Hypertension Brother   . Heart disease Brother   . Stroke Maternal Grandmother    Social History   Tobacco Use  . Smoking status: Never Smoker  . Smokeless tobacco: Never Used  Substance Use Topics  . Alcohol use: No  . Drug use: No    ROS Constitutional: Denies fever, chills, weight loss/gain, headaches, insomnia,  night sweats, and change in appetite. Does c/o fatigue. Eyes: Denies redness, blurred vision, diplopia, discharge, itchy, watery eyes.  ENT: Denies discharge, congestion, post nasal drip, epistaxis, sore throat, earache, hearing loss, dental pain, Tinnitus, Vertigo, Sinus pain, snoring.  Cardio: Denies chest pain, palpitations, irregular heartbeat, syncope, dyspnea, diaphoresis, orthopnea, PND, claudication, edema Respiratory: denies cough, dyspnea, DOE, pleurisy, hoarseness, laryngitis, wheezing.  Gastrointestinal: Denies dysphagia, heartburn, reflux, water brash, pain, cramps, nausea, vomiting, bloating, diarrhea, constipation, hematemesis, melena, hematochezia, jaundice, hemorrhoids Genitourinary: Denies dysuria, frequency, urgency, nocturia, hesitancy, discharge, hematuria, flank pain Breast: Breast lumps, nipple discharge, bleeding.  Musculoskeletal: Denies arthralgia, myalgia, stiffness, Jt. Swelling, pain, limp, and strain/sprain. Denies falls. Skin: Denies puritis, rash, hives, warts, acne, eczema, changing in skin lesion Neuro: No weakness, tremor, incoordination, spasms, paresthesia,  pain Psychiatric: Denies confusion, memory loss, sensory loss. Denies Depression. Endocrine: Denies change in weight, skin, hair change, nocturia, and paresthesia, diabetic polys, visual blurring, hyper / hypo glycemic episodes.  Heme/Lymph: No excessive bleeding, bruising, enlarged lymph nodes.  Physical Exam  BP 114/82   Pulse 88   Temp (!) 97.5 F (36.4 C)   Resp 16   Ht 5\' 2"  (1.575 m)   Wt 179 lb 6.4 oz (81.4 kg)   BMI 32.81 kg/m   General Appearance: Over nourished, well groomed and in no apparent distress.  Eyes: PERRLA, EOMs, conjunctiva no swelling or erythema, normal fundi and vessels. Sinuses: No frontal/maxillary tenderness ENT/Mouth: EACs patent / TMs  nl. Nares clear without erythema, swelling, mucoid exudates. Oral hygiene is good. No erythema, swelling, or exudate. Tongue normal, non-obstructing. Tonsils not swollen or erythematous. Hearing normal.  Neck: Supple, thyroid not palpable. No bruits, nodes or JVD. Respiratory: Respiratory effort normal.  BS equal and clear bilateral without rales, rhonci, wheezing or stridor. Cardio: Heart sounds are normal with regular rate and rhythm and no  murmurs, rubs or gallops. Peripheral pulses are normal and equal bilaterally without edema. No aortic or femoral bruits. Chest: symmetric with normal excursions and percussion. Breasts: Symmetric, without lumps, nipple discharge, retractions, or fibrocystic changes.  Abdomen: Flat, soft with bowel sounds active. Nontender, no guarding, rebound, hernias, masses, or organomegaly.  Lymphatics: Non tender without lymphadenopathy.  Genitourinary:  Musculoskeletal: Full ROM all peripheral extremities, joint stability, 5/5 strength, and normal gait. Skin: Warm and dry without rashes, lesions, cyanosis, clubbing or  ecchymosis.  Neuro: Cranial nerves intact, reflexes equal bilaterally. Normal muscle tone, no cerebellar symptoms. Sensation intact.  Pysch: Alert and oriented X 3, normal affect,  Insight and Judgment appropriate.   Assessment and Plan  1. Annual Preventative Screening Examination   2. Essential hypertension  - EKG 12-Lead - [place], retroperitnl abd,  ltd - Urinalysis, Routine w reflex microscopic - Microalbumin / Creatinine Urine Ratio - CBC with Diff - COMPLETE METABOLIC PANEL WITH GFR - Magnesium - TSH  3. Hyperlipidemia, mixed  - EKG 12-Lead - [place], retroperitnl abd,  ltd - Lipid Profile - TSH  4. Abnormal glucose  - EKG 12-Lead - [place], retroperitnl abd,  ltd - Hemoglobin A1c ([facility]) - Insulin, random  5. Vitamin D deficiency  - Vitamin D (25 hydroxy)  6. Screening for colorectal cancer  - POC Hemoccult Bld/Stl 7. Screening examination for pulmonary tuberculosis  - TB Skin Test  8. Screening for ischemic heart disease  - EKG 12-Lead  9. FHx: heart disease  - EKG 12-Lead - [place], retroperitnl abd,  ltd  10. Aortic atherosclerosis (HCC)  - [place], retroperitnl abd,  ltd  11. Screening for AAA (aortic abdominal aneurysm)  - [place], retroperitnl abd,  ltd  12. Fatigue, unspecified type  - Iron,Total/Total Iron Binding Cap - Vitamin B12 - CBC with Diff - TSH  13. Medication management  - Urinalysis, Routine w reflex microscopic - Microalbumin / Creatinine Urine Ratio - CBC with Diff - COMPLETE METABOLIC PANEL WITH GFR - Magnesium - Lipid Profile - TSH - Hemoglobin A1c ([facility]) - Insulin, random - Vitamin D (25 hydroxy)         Patient was counseled in prudent diet to achieve/maintain BMI less than 25 for weight control, BP monitoring, regular exercise and medications. Discussed med's effects and SE's. Screening labs and tests as requested with regular follow-up as recommended. Over 40 minutes of exam, counseling, chart review and high complex critical decision making was performed.   Kirtland Bouchard, MD

## 2019-01-03 ENCOUNTER — Ambulatory Visit: Payer: 59 | Admitting: Internal Medicine

## 2019-01-03 ENCOUNTER — Other Ambulatory Visit: Payer: Self-pay

## 2019-01-03 VITALS — BP 114/82 | HR 88 | Temp 97.5°F | Resp 16 | Ht 62.0 in | Wt 179.4 lb

## 2019-01-03 DIAGNOSIS — Z23 Encounter for immunization: Secondary | ICD-10-CM | POA: Diagnosis not present

## 2019-01-03 DIAGNOSIS — R7309 Other abnormal glucose: Secondary | ICD-10-CM

## 2019-01-03 DIAGNOSIS — Z0001 Encounter for general adult medical examination with abnormal findings: Secondary | ICD-10-CM

## 2019-01-03 DIAGNOSIS — I700 Atherosclerosis of aorta: Secondary | ICD-10-CM | POA: Diagnosis not present

## 2019-01-03 DIAGNOSIS — Z8249 Family history of ischemic heart disease and other diseases of the circulatory system: Secondary | ICD-10-CM

## 2019-01-03 DIAGNOSIS — E669 Obesity, unspecified: Secondary | ICD-10-CM

## 2019-01-03 DIAGNOSIS — Z79899 Other long term (current) drug therapy: Secondary | ICD-10-CM

## 2019-01-03 DIAGNOSIS — I10 Essential (primary) hypertension: Secondary | ICD-10-CM

## 2019-01-03 DIAGNOSIS — Z0000 Encounter for general adult medical examination without abnormal findings: Secondary | ICD-10-CM

## 2019-01-03 DIAGNOSIS — Z111 Encounter for screening for respiratory tuberculosis: Secondary | ICD-10-CM | POA: Diagnosis not present

## 2019-01-03 DIAGNOSIS — E782 Mixed hyperlipidemia: Secondary | ICD-10-CM

## 2019-01-03 DIAGNOSIS — E66811 Obesity, class 1: Secondary | ICD-10-CM

## 2019-01-03 DIAGNOSIS — E559 Vitamin D deficiency, unspecified: Secondary | ICD-10-CM

## 2019-01-03 DIAGNOSIS — Z1211 Encounter for screening for malignant neoplasm of colon: Secondary | ICD-10-CM

## 2019-01-03 DIAGNOSIS — Z136 Encounter for screening for cardiovascular disorders: Secondary | ICD-10-CM

## 2019-01-03 DIAGNOSIS — R5383 Other fatigue: Secondary | ICD-10-CM

## 2019-01-03 MED ORDER — PHENTERMINE HCL 37.5 MG PO TABS: ORAL_TABLET | ORAL | 1 refills | Status: DC

## 2019-01-03 MED ORDER — TOPIRAMATE 50 MG PO TABS: ORAL_TABLET | ORAL | 1 refills | Status: DC

## 2019-01-04 LAB — CBC WITH DIFFERENTIAL/PLATELET
Absolute Monocytes: 464 cells/uL (ref 200–950)
Basophils Absolute: 71 cells/uL (ref 0–200)
Basophils Relative: 1.4 %
Eosinophils Absolute: 92 cells/uL (ref 15–500)
Eosinophils Relative: 1.8 %
HCT: 39.4 % (ref 35.0–45.0)
Hemoglobin: 13.3 g/dL (ref 11.7–15.5)
Lymphs Abs: 1142 cells/uL (ref 850–3900)
MCH: 30.8 pg (ref 27.0–33.0)
MCHC: 33.8 g/dL (ref 32.0–36.0)
MCV: 91.2 fL (ref 80.0–100.0)
MPV: 11.2 fL (ref 7.5–12.5)
Monocytes Relative: 9.1 %
Neutro Abs: 3330 cells/uL (ref 1500–7800)
Neutrophils Relative %: 65.3 %
Platelets: 223 10*3/uL (ref 140–400)
RBC: 4.32 10*6/uL (ref 3.80–5.10)
RDW: 12.4 % (ref 11.0–15.0)
Total Lymphocyte: 22.4 %
WBC: 5.1 10*3/uL (ref 3.8–10.8)

## 2019-01-04 LAB — IRON, TOTAL/TOTAL IRON BINDING CAP
%SAT: 39 % (calc) (ref 16–45)
Iron: 138 ug/dL (ref 45–160)
TIBC: 352 mcg/dL (calc) (ref 250–450)

## 2019-01-04 LAB — VITAMIN D 25 HYDROXY (VIT D DEFICIENCY, FRACTURES): Vit D, 25-Hydroxy: 79 ng/mL (ref 30–100)

## 2019-01-04 LAB — COMPLETE METABOLIC PANEL WITH GFR
AG Ratio: 1.8 (calc) (ref 1.0–2.5)
ALT: 12 U/L (ref 6–29)
AST: 15 U/L (ref 10–35)
Albumin: 4.7 g/dL (ref 3.6–5.1)
Alkaline phosphatase (APISO): 154 U/L — ABNORMAL HIGH (ref 37–153)
BUN: 18 mg/dL (ref 7–25)
CO2: 31 mmol/L (ref 20–32)
Calcium: 9.8 mg/dL (ref 8.6–10.4)
Chloride: 102 mmol/L (ref 98–110)
Creat: 1 mg/dL (ref 0.50–1.05)
GFR, Est African American: 73 mL/min/{1.73_m2} (ref >=60)
GFR, Est Non African American: 63 mL/min/{1.73_m2} (ref >=60)
Globulin: 2.6 g/dL (calc) (ref 1.9–3.7)
Glucose, Bld: 96 mg/dL (ref 65–99)
Potassium: 4.6 mmol/L (ref 3.5–5.3)
Sodium: 140 mmol/L (ref 135–146)
Total Bilirubin: 0.5 mg/dL (ref 0.2–1.2)
Total Protein: 7.3 g/dL (ref 6.1–8.1)

## 2019-01-04 LAB — URINALYSIS, ROUTINE W REFLEX MICROSCOPIC
Bilirubin Urine: NEGATIVE
Glucose, UA: NEGATIVE
Hgb urine dipstick: NEGATIVE
Ketones, ur: NEGATIVE
Leukocytes,Ua: NEGATIVE
Nitrite: NEGATIVE
Protein, ur: NEGATIVE
Specific Gravity, Urine: 1.01 (ref 1.001–1.03)
pH: 6.5 (ref 5.0–8.0)

## 2019-01-04 LAB — LIPID PANEL
Cholesterol: 158 mg/dL (ref <200)
HDL: 56 mg/dL (ref >=50)
LDL Cholesterol (Calc): 81 mg/dL (calc)
Non-HDL Cholesterol (Calc): 102 mg/dL (calc) (ref <130)
Total CHOL/HDL Ratio: 2.8 (calc) (ref <5.0)
Triglycerides: 111 mg/dL (ref <150)

## 2019-01-04 LAB — HEMOGLOBIN A1C
Hgb A1c MFr Bld: 5.2 % of total Hgb (ref <5.7)
Mean Plasma Glucose: 103 (calc)
eAG (mmol/L): 5.7 (calc)

## 2019-01-04 LAB — VITAMIN B12: Vitamin B-12: 581 pg/mL (ref 200–1100)

## 2019-01-04 LAB — MICROALBUMIN / CREATININE URINE RATIO
Creatinine, Urine: 53 mg/dL (ref 20–275)
Microalb Creat Ratio: 4 mcg/mg creat (ref <30)
Microalb, Ur: 0.2 mg/dL

## 2019-01-04 LAB — INSULIN, RANDOM: Insulin: 13 u[IU]/mL

## 2019-01-04 LAB — MAGNESIUM: Magnesium: 2.1 mg/dL (ref 1.5–2.5)

## 2019-01-04 LAB — TSH: TSH: 1.81 mIU/L

## 2019-01-07 LAB — TB SKIN TEST
Induration: 0 mm
TB Skin Test: NEGATIVE

## 2019-01-18 ENCOUNTER — Other Ambulatory Visit: Payer: Self-pay

## 2019-01-18 ENCOUNTER — Other Ambulatory Visit: Payer: Self-pay | Admitting: Adult Health

## 2019-01-18 DIAGNOSIS — J0140 Acute pansinusitis, unspecified: Secondary | ICD-10-CM

## 2019-01-18 DIAGNOSIS — J0410 Acute tracheitis without obstruction: Secondary | ICD-10-CM

## 2019-01-18 MED ORDER — PROMETHAZINE-DM 6.25-15 MG/5ML PO SYRP: ORAL_SOLUTION | ORAL | 0 refills | Status: DC

## 2019-01-29 ENCOUNTER — Other Ambulatory Visit: Payer: Self-pay | Admitting: Internal Medicine

## 2019-01-29 DIAGNOSIS — F32A Depression, unspecified: Secondary | ICD-10-CM

## 2019-01-29 DIAGNOSIS — M542 Cervicalgia: Secondary | ICD-10-CM

## 2019-01-29 DIAGNOSIS — F329 Major depressive disorder, single episode, unspecified: Secondary | ICD-10-CM

## 2019-01-29 MED ORDER — CITALOPRAM HYDROBROMIDE 40 MG PO TABS: ORAL_TABLET | ORAL | 3 refills | Status: DC

## 2019-01-29 MED ORDER — CYCLOBENZAPRINE HCL 10 MG PO TABS: ORAL_TABLET | ORAL | 0 refills | Status: DC

## 2019-01-29 MED ORDER — GABAPENTIN 300 MG PO CAPS: ORAL_CAPSULE | ORAL | 0 refills | Status: DC

## 2019-03-01 ENCOUNTER — Other Ambulatory Visit: Payer: Self-pay | Admitting: Internal Medicine

## 2019-03-01 DIAGNOSIS — J0140 Acute pansinusitis, unspecified: Secondary | ICD-10-CM

## 2019-03-01 DIAGNOSIS — J0410 Acute tracheitis without obstruction: Secondary | ICD-10-CM

## 2019-03-01 MED ORDER — PROMETHAZINE-DM 6.25-15 MG/5ML PO SYRP: ORAL_SOLUTION | ORAL | 0 refills | Status: DC

## 2019-04-01 ENCOUNTER — Other Ambulatory Visit: Payer: Self-pay | Admitting: Internal Medicine

## 2019-04-01 DIAGNOSIS — M542 Cervicalgia: Secondary | ICD-10-CM

## 2019-04-01 MED ORDER — CYCLOBENZAPRINE HCL 10 MG PO TABS: ORAL_TABLET | ORAL | 0 refills | Status: DC

## 2019-04-07 ENCOUNTER — Telehealth: Payer: Self-pay | Admitting: Internal Medicine

## 2019-04-07 NOTE — Telephone Encounter (Signed)
  Last saw Erin Nichols in oct 2019 when chronic cuiogh was improved Then CT chest august 2020 with below result   Plan  - apologize for delay with result - nodules stable > 1 year  -  She can make appt to see me or an app for cough if she wants  Thanks    SIGNATURE    Dr. Brand Males, M.D., F.C.C.P,  Pulmonary and Critical Care Medicine Staff Physician, [facility] Director - Interstitial Lung Disease  Program  [facility] [place], [place], [address]  Pager: [phone], If no answer or between  15:00h - 7:00h: call [phone] Telephone: [phone]  12:09 PM 04/07/2019     IMPRESSION: 1. Tiny pulmonary nodules are similar over greater than 1 year, consistent with a benign etiology. 2.  No acute process in the chest. 3.  Aortic Atherosclerosis (ICD10-I70.0).   Electronically Signed   By: Abigail Miyamoto M.D.   On: 10/19/2018 11:58

## 2019-04-08 NOTE — Telephone Encounter (Signed)
LMTCB x1 for pt.  

## 2019-04-09 NOTE — Telephone Encounter (Signed)
Attempted to call pt but unable to reach. Left message for pt to return call. 

## 2019-04-09 NOTE — Telephone Encounter (Signed)
Patient is returning phone call.  Patient phone number is [phone].

## 2019-04-10 NOTE — Telephone Encounter (Signed)
Called and spoke with pt letting her know the results of the CT and stated to her MR said she could make an appt or f/u prn. Pt states she will f/u prn. Nothing further needed.

## 2019-04-11 ENCOUNTER — Ambulatory Visit: Payer: 59 | Admitting: Adult Health

## 2019-04-15 ENCOUNTER — Other Ambulatory Visit: Payer: Self-pay | Admitting: Internal Medicine

## 2019-04-15 MED ORDER — GABAPENTIN 300 MG PO CAPS: ORAL_CAPSULE | ORAL | 0 refills | Status: DC

## 2019-04-30 ENCOUNTER — Ambulatory Visit: Payer: 59 | Admitting: Adult Health

## 2019-05-14 NOTE — Progress Notes (Signed)
FOLLOW UP  Assessment and Plan:   Hypertension Well controlled with current medications  Monitor blood pressure at home; patient to call if consistently greater than 130/80 Continue DASH diet.   Reminder to go to the ER if any CP, SOB, nausea, dizziness, severe HA, changes vision/speech, left arm numbness and tingling and jaw pain.  Cholesterol At goal on statin; continue medication  Continue low cholesterol diet and exercise.  Check lipid panel.   Other abnormal glucose Discussed risks associated with elevated glucose Recommend prudent diet, portion control, maintenance of weight in normal range, regular aerobic exercise Defer A1C; check annually - monitor sugars by CMP  GERD Improved control with nexium; not a candidate for H2i at this time, monitor magnesium Discussed diet, avoiding triggers and other lifestyle changes  Obesity Long discussion about weight loss, diet, and exercise Recommended diet heavy in fruits and veggies and low in animal meats, cheeses, and dairy products, appropriate calorie intake Discussed appropriate weight for height, initial goal set for <170lb Patient on phentermine/topamax with benefit and no SE, taking drug breaks; continue close follow up.  Discussed adding exercise focusing on muscle building, reduce processed carbohydrate, increase protein, fresh fruits/veggies, nuts/seeds, etc Follow up in 3 months  Depression/anxiety She reports fairly controlled by current regimen; Lifestyle discussed: diet/exerise, sleep hygiene, stress management, hydration  Vitamin D Def/ osteoporosis prevention At goal at recent check; continue supplement for goal range 60-100 Defer vitamin D level   Continue diet and meds as discussed. Further disposition pending results of labs. Discussed med's effects and SE's.   Over 30 minutes of exam, counseling, chart review, and critical decision making was performed.   Future Appointments  Date Time Provider  [place]  07/10/2019  4:30 PM Unk Pinto, MD [facility] None  01/27/2020 11:00 AM Unk Pinto, MD [facility] None    ----------------------------------------------------------------------------------------------------------------------  HPI 54 y.o. female  presents for 3 month follow up on hypertension, cholesterol, GERD, blood sugar monitoring, weight, depression and vitamin D deficiency.   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   She had first covid 19 shot, some soreness and fatigue for 1 day but did well.   she is prescribed phentermine and topamax for weight loss.  While on the medication they have lost 4 lbs since last visit. They deny palpitations, anxiety, trouble sleeping, elevated BP.   BMI is Body mass index is 32.15 kg/m., she has been working on [employer] Readings from Last 3 Encounters:  05/15/19 175 lb 12.8 oz (79.7 kg)  01/03/19 179 lb 6.4 oz (81.4 kg)  10/02/18 178 lb (80.7 kg)   Typical breakfast: bowl of honey nut cheerios, 2% milk, 1/2 can diet mountain dew Snack at 10:30-11: peanut butter crackers, lasts the rest of the day  Typical lunch: Sandwich, honey wheat, 1/2 PPJ, or 1/2 ham and small bag of potato chips Typical dinner: eats with her in-laws, they cook, "goulash" - hamburger, tomato, macaroni, occasionally veggie sides (broccoli, cabbage, etc), will do sweet potatoes instead of white Exercise: None  Water intake: great - water mostly, 16 oz, refills at least 5 times a day, no alcohol   she has ongoing depression with anxious features and is currently on celexa 40 mg daily, wellbutrin XR 300 mg daily, and she is on buspar 15mg  once every 2-3 days, reports symptoms are well controlled on current regimen.   she has a diagnosis of GERD which is currently managed by nexium 40 mg,  dry cough improved, has been seeing ENT.   Her blood pressure has been controlled at home, today their BP is BP: 120/80   She does workout. She denies chest pain, shortness of breath, dizziness.   She is on cholesterol medication (atorvastatin 20 mg daily) and denies myalgias. Her cholesterol is at goal. The cholesterol last visit was:   Lab Results  Component Value Date   CHOL 158 01/03/2019   HDL 56 01/03/2019   LDLCALC 81 01/03/2019   TRIG 111 01/03/2019   CHOLHDL 2.8 01/03/2019    She has been working on diet and exercise for blood sugar control, and denies increased appetite, nausea, paresthesia of the feet, polydipsia, polyuria, visual disturbances and vomiting. Last A1C in the office was:  Lab Results  Component Value Date   HGBA1C 5.2 01/03/2019   Patient is on Vitamin D supplement  Lab Results  Component Value Date   VD25OH 79 01/03/2019      Current Medications:  Current Outpatient Medications on File Prior to Visit  Medication Sig  . aspirin 81 MG chewable tablet Chew by mouth daily.  Marland Kitchen atorvastatin (LIPITOR) 20 MG tablet Take 1 tablet Daily for Cholesterol  . buPROPion (WELLBUTRIN XL) 300 MG 24 hr tablet Take 1 tablet (300 mg total) by mouth every morning.  . busPIRone (BUSPAR) 15 MG tablet Take 1 tablet 3 x  /day for anxiety.  . calcium citrate (CALCITRATE - DOSED IN MG ELEMENTAL CALCIUM) 950 MG tablet Take 200 mg of elemental calcium by mouth daily.  . citalopram (CELEXA) 40 MG tablet Take 1 tablet Daily for Anxiety & Mood  . cyclobenzaprine (FLEXERIL) 10 MG tablet Take 1/2 to 1 tablet up to 3 x /day ONLY if needed for Muscle Spasm  . Fexofenadine HCl (ALLEGRA PO) Take by mouth.  . gabapentin (NEURONTIN) 300 MG capsule Take 1 capsule 3 x /day for Pain or Cough  . ibuprofen (ADVIL,MOTRIN) 600 MG tablet TAKE 1 TABLET BY MOUTH EVERY 6 HOURS AS NEEDED  . phentermine (ADIPEX-P) 37.5 MG tablet Take 1/2 to 1 tablet every Morning for Dieting & Weight Loss  . Prenatal Vit-Fe Fumarate-FA (M-VIT PO) Take 1 tablet by mouth daily.  Marland Kitchen PROAIR HFA 108 (90 Base) MCG/ACT inhaler INHALE 1 PUFF PO Q 4 H  (Patient taking differently: INHALE 1 PUFF PO Q 4 H PRN)  . promethazine-dextromethorphan (PROMETHAZINE-DM) 6.25-15 MG/5ML syrup Take 1 to 2 teaspoons every 4 hours if needed for severe cough  . topiramate (TOPAMAX) 50 MG tablet Take 1/2 to 1 tablet 2 x /day at Suppertime & Bedtime for Dieting & Weight Loss  . vitamin B-12 (CYANOCOBALAMIN) 100 MCG tablet Take 100 mcg by mouth daily.   . Vitamin D, Ergocalciferol, (DRISDOL) 1.25 MG (50000 UT) CAPS capsule Take 1 capsule every week for severe Vit DDeficiency   No current facility-administered medications on file prior to visit.     Allergies: No Known Allergies   Medical History:  Past Medical History:  Diagnosis Date  . Allergy   . Asthma   . GERD (gastroesophageal reflux disease) 2005  . 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 Hyperlipidemia 2012  . Hypertension 2012  . Vitamin D deficiency    Family history- Reviewed and unchanged Social history- Reviewed and unchanged   Review of Systems:  Review of Systems  Constitutional: Negative for malaise/fatigue and weight loss.  HENT: Negative for hearing loss and tinnitus.   Eyes: Negative for blurred vision and double vision.  Respiratory: Positive for cough (dry cough, improved with nexium, seeing ENT for this). Negative for shortness of breath and wheezing.   Cardiovascular: Negative for chest pain, palpitations, orthopnea, claudication and leg swelling.  Gastrointestinal: Negative for abdominal pain, blood in stool, constipation, diarrhea, heartburn, melena, nausea and vomiting.  Genitourinary: Negative.   Musculoskeletal: Negative for joint pain and myalgias.  Skin: Negative for rash.  Neurological: Negative for dizziness, tingling, sensory change, weakness and headaches.  Endo/Heme/Allergies: Negative for polydipsia.  Psychiatric/Behavioral: Negative for depression and substance abuse. The patient is nervous/anxious (chronic since childhood, improved with medication).    All other systems reviewed and are negative.    Physical Exam: BP 120/80   Pulse 100   Temp (!) 97.3 F (36.3 C)   Wt 175 lb 12.8 oz (79.7 kg)   SpO2 98%   BMI 32.15 kg/m  Wt Readings from Last 3 Encounters:  05/15/19 175 lb 12.8 oz (79.7 kg)  01/03/19 179 lb 6.4 oz (81.4 kg)  10/02/18 178 lb (80.7 kg)   General Appearance: Well nourished, in no apparent distress. Eyes: PERRLA, EOMs, conjunctiva no swelling or erythema Sinuses: No Frontal/maxillary tenderness ENT/Mouth: Ext aud canals clear, TMs without erythema, bulging. No erythema, swelling, or exudate on post pharynx.  Tonsils not swollen or erythematous. Hearing normal.  Neck: Supple, thyroid normal.  Respiratory: Respiratory effort normal, BS equal bilaterally without rales, rhonchi, wheezing or stridor.  Cardio: RRR with no MRGs. Brisk peripheral pulses without edema.  Abdomen: Soft, + BS.  Non tender, no guarding, rebound, hernias, masses. Lymphatics: Non tender without lymphadenopathy.  Musculoskeletal: Full ROM, 5/5 strength, Normal gait Skin: Warm, dry without rashes, lesions, ecchymosis.  Neuro: Cranial nerves intact. No cerebellar symptoms.  Psych: Awake and oriented X 3, mildly anxious affect, Insight and Judgment appropriate.    Izora Ribas, NP 4:19 PM [facility]

## 2019-05-15 ENCOUNTER — Encounter: Payer: Self-pay | Admitting: Adult Health

## 2019-05-15 ENCOUNTER — Other Ambulatory Visit: Payer: Self-pay

## 2019-05-15 ENCOUNTER — Ambulatory Visit: Payer: 59 | Admitting: Adult Health

## 2019-05-15 VITALS — BP 120/80 | HR 100 | Temp 97.3°F | Wt 175.8 lb

## 2019-05-15 DIAGNOSIS — J45909 Unspecified asthma, uncomplicated: Secondary | ICD-10-CM | POA: Diagnosis not present

## 2019-05-15 DIAGNOSIS — E559 Vitamin D deficiency, unspecified: Secondary | ICD-10-CM | POA: Diagnosis not present

## 2019-05-15 DIAGNOSIS — I700 Atherosclerosis of aorta: Secondary | ICD-10-CM | POA: Diagnosis not present

## 2019-05-15 DIAGNOSIS — E669 Obesity, unspecified: Secondary | ICD-10-CM

## 2019-05-15 DIAGNOSIS — I10 Essential (primary) hypertension: Secondary | ICD-10-CM | POA: Diagnosis not present

## 2019-05-15 DIAGNOSIS — R7309 Other abnormal glucose: Secondary | ICD-10-CM

## 2019-05-15 DIAGNOSIS — F325 Major depressive disorder, single episode, in full remission: Secondary | ICD-10-CM

## 2019-05-15 DIAGNOSIS — E782 Mixed hyperlipidemia: Secondary | ICD-10-CM

## 2019-05-15 NOTE — Patient Instructions (Addendum)
Goals    . DIET - INCREASE WATER INTAKE     65-80+ fluid ounces     . Exercise 150 min/wk Moderate Activity    . Weight (lb) < 170 lb (77.1 kg)       Try to do 20 min of weights or other muscle building exercises at least 2-3 times per week   Try switching out honey nut cheerios, peanut butter crackers, potato chips for high protein and/or fresh fruit/veggies - aim for less processed carb, more protein and whole, high fiber foods  Try adding claritin, allegra or zyrtec- generics are fine - try to 2-4 weeks and see if cough and secretions    Mediterranean Diet A Mediterranean diet refers to food and lifestyle choices that are based on the traditions of countries located on the [employer]. This way of eating has been shown to help prevent certain conditions and improve outcomes for people who have chronic diseases, like kidney disease and heart disease. What are tips for following this plan? Lifestyle  Cook and eat meals together with your family, when possible.  Drink enough fluid to keep your urine clear or pale yellow.  Be physically active every day. This includes: ? Aerobic exercise like running or swimming. ? Leisure activities like gardening, walking, or housework.  Get 7-8 hours of sleep each night.  If recommended by your health care provider, drink red wine in moderation. This means 1 glass a day for nonpregnant women and 2 glasses a day for men. A glass of wine equals 5 oz (150 mL). Reading food labels   Check the serving size of packaged foods. For foods such as rice and pasta, the serving size refers to the amount of cooked product, not dry.  Check the total fat in packaged foods. Avoid foods that have saturated fat or trans fats.  Check the ingredients list for added sugars, such as corn syrup. Shopping  At the grocery store, buy most of your food from the areas near the walls of the store. This includes: ? Fresh fruits and vegetables  (produce). ? Grains, beans, nuts, and seeds. Some of these may be available in unpackaged forms or large amounts (in bulk). ? Fresh seafood. ? Poultry and eggs. ? Low-fat dairy products.  Buy whole ingredients instead of prepackaged foods.  Buy fresh fruits and vegetables in-season from local farmers markets.  Buy frozen fruits and vegetables in resealable bags.  If you do not have access to quality fresh seafood, buy precooked frozen shrimp or canned fish, such as tuna, salmon, or sardines.  Buy small amounts of raw or cooked vegetables, salads, or olives from the deli or salad bar at your store.  Stock your pantry so you always have certain foods on hand, such as olive oil, canned tuna, canned tomatoes, rice, pasta, and beans. Cooking  Cook foods with extra-virgin olive oil instead of using butter or other vegetable oils.  Have meat as a side dish, and have vegetables or grains as your main dish. This means having meat in small portions or adding small amounts of meat to foods like pasta or stew.  Use beans or vegetables instead of meat in common dishes like chili or lasagna.  Experiment with different cooking methods. Try roasting or broiling vegetables instead of steaming or sauteing them.  Add frozen vegetables to soups, stews, pasta, or rice.  Add nuts or seeds for added healthy fat at each meal. You can add these to yogurt, salads, or vegetable  dishes.  Marinate fish or vegetables using olive oil, lemon juice, garlic, and fresh herbs. Meal planning   Plan to eat 1 vegetarian meal one day each week. Try to work up to 2 vegetarian meals, if possible.  Eat seafood 2 or more times a week.  Have healthy snacks readily available, such as: ? Vegetable sticks with hummus. ? [place] yogurt. ? Fruit and nut trail mix.  Eat balanced meals throughout the week. This includes: ? Fruit: 2-3 servings a day ? Vegetables: 4-5 servings a day ? Low-fat dairy: 2 servings a  day ? Fish, poultry, or lean meat: 1 serving a day ? Beans and legumes: 2 or more servings a week ? Nuts and seeds: 1-2 servings a day ? Whole grains: 6-8 servings a day ? Extra-virgin olive oil: 3-4 servings a day  Limit red meat and sweets to only a few servings a month What are my food choices?  Mediterranean diet ? Recommended  Grains: Whole-grain pasta. Brown rice. Bulgar wheat. Polenta. Couscous. Whole-wheat bread. [place].  Vegetables: Artichokes. Beets. Broccoli. Cabbage. Carrots. Eggplant. Green beans. Chard. Kale. Spinach. Onions. Leeks. Peas. Squash. Tomatoes. Peppers. Radishes.  Fruits: Apples. Apricots. Avocado. Berries. Bananas. Cherries. Dates. Figs. Grapes. Lemons. Melon. Oranges. Peaches. Plums. Pomegranate.  Meats and other protein foods: Beans. Almonds. Sunflower seeds. Pine nuts. Peanuts. [place]. Salmon. Scallops. Shrimp. [place]. Tilapia. Clams. Oysters. Eggs.  Dairy: Low-fat milk. Cheese. Greek yogurt.  Beverages: Water. Red wine. Herbal tea.  Fats and oils: Extra virgin olive oil. Avocado oil. Grape seed oil.  Sweets and desserts: [place] yogurt with honey. Baked apples. Poached pears. Trail mix.  Seasoning and other foods: Basil. Cilantro. Coriander. Cumin. Mint. Parsley. Sage. Rosemary. Tarragon. Garlic. Oregano. Thyme. Pepper. Balsalmic vinegar. Tahini. Hummus. Tomato sauce. Olives. Mushrooms. ? Limit these  Grains: Prepackaged pasta or rice dishes. Prepackaged cereal with added sugar.  Vegetables: Deep fried potatoes (french fries).  Fruits: Fruit canned in syrup.  Meats and other protein foods: Beef. Pork. Lamb. Poultry with skin. Hot dogs. [place] Salines.  Dairy: Ice cream. Sour cream. Whole milk.  Beverages: Juice. Sugar-sweetened soft drinks. Beer. Liquor and spirits.  Fats and oils: Butter. Canola oil. Vegetable oil. Beef fat (tallow). Lard.  Sweets and desserts: Cookies. Cakes. Pies. Candy.  Seasoning and other foods: Mayonnaise. Premade sauces  and marinades. The items listed may not be a complete list. Talk with your dietitian about what dietary choices are right for you. Summary  The Mediterranean diet includes both food and lifestyle choices.  Eat a variety of fresh fruits and vegetables, beans, nuts, seeds, and whole grains.  Limit the amount of red meat and sweets that you eat.  Talk with your health care provider about whether it is safe for you to drink red wine in moderation. This means 1 glass a day for nonpregnant women and 2 glasses a day for men. A glass of wine equals 5 oz (150 mL). This information is not intended to replace advice given to you by your health care provider. Make sure you discuss any questions you have with your health care provider. Document Revised: 10/08/2015 Document Reviewed: 10/01/2015 Elsevier Patient Education  [place].

## 2019-05-16 LAB — CBC WITH DIFFERENTIAL/PLATELET
Absolute Monocytes: 470 cells/uL (ref 200–950)
Basophils Absolute: 52 cells/uL (ref 0–200)
Basophils Relative: 0.9 %
Eosinophils Absolute: 87 cells/uL (ref 15–500)
Eosinophils Relative: 1.5 %
HCT: 41.8 % (ref 35.0–45.0)
Hemoglobin: 13.8 g/dL (ref 11.7–15.5)
Lymphs Abs: 1415 cells/uL (ref 850–3900)
MCH: 30.5 pg (ref 27.0–33.0)
MCHC: 33 g/dL (ref 32.0–36.0)
MCV: 92.3 fL (ref 80.0–100.0)
MPV: 10.8 fL (ref 7.5–12.5)
Monocytes Relative: 8.1 %
Neutro Abs: 3776 cells/uL (ref 1500–7800)
Neutrophils Relative %: 65.1 %
Platelets: 257 10*3/uL (ref 140–400)
RBC: 4.53 10*6/uL (ref 3.80–5.10)
RDW: 12.2 % (ref 11.0–15.0)
Total Lymphocyte: 24.4 %
WBC: 5.8 10*3/uL (ref 3.8–10.8)

## 2019-05-16 LAB — COMPLETE METABOLIC PANEL WITH GFR
AG Ratio: 1.7 (calc) (ref 1.0–2.5)
ALT: 12 U/L (ref 6–29)
AST: 16 U/L (ref 10–35)
Albumin: 4.5 g/dL (ref 3.6–5.1)
Alkaline phosphatase (APISO): 169 U/L — ABNORMAL HIGH (ref 37–153)
BUN: 12 mg/dL (ref 7–25)
CO2: 25 mmol/L (ref 20–32)
Calcium: 9.4 mg/dL (ref 8.6–10.4)
Chloride: 104 mmol/L (ref 98–110)
Creat: 0.88 mg/dL (ref 0.50–1.05)
GFR, Est African American: 86 mL/min/{1.73_m2} (ref >=60)
GFR, Est Non African American: 74 mL/min/{1.73_m2} (ref >=60)
Globulin: 2.6 g/dL (calc) (ref 1.9–3.7)
Glucose, Bld: 81 mg/dL (ref 65–99)
Potassium: 4.1 mmol/L (ref 3.5–5.3)
Sodium: 138 mmol/L (ref 135–146)
Total Bilirubin: 0.4 mg/dL (ref 0.2–1.2)
Total Protein: 7.1 g/dL (ref 6.1–8.1)

## 2019-05-16 LAB — MAGNESIUM: Magnesium: 2.2 mg/dL (ref 1.5–2.5)

## 2019-05-16 LAB — LIPID PANEL
Cholesterol: 159 mg/dL (ref <200)
HDL: 48 mg/dL — ABNORMAL LOW (ref >=50)
LDL Cholesterol (Calc): 91 mg/dL (calc)
Non-HDL Cholesterol (Calc): 111 mg/dL (calc) (ref <130)
Total CHOL/HDL Ratio: 3.3 (calc) (ref <5.0)
Triglycerides: 106 mg/dL (ref <150)

## 2019-05-16 LAB — TSH: TSH: 2.77 mIU/L

## 2019-05-20 ENCOUNTER — Other Ambulatory Visit: Payer: Self-pay | Admitting: Internal Medicine

## 2019-05-20 DIAGNOSIS — F329 Major depressive disorder, single episode, unspecified: Secondary | ICD-10-CM

## 2019-05-20 DIAGNOSIS — F32A Depression, unspecified: Secondary | ICD-10-CM

## 2019-05-20 DIAGNOSIS — J0410 Acute tracheitis without obstruction: Secondary | ICD-10-CM

## 2019-05-20 DIAGNOSIS — J0140 Acute pansinusitis, unspecified: Secondary | ICD-10-CM

## 2019-05-20 DIAGNOSIS — M542 Cervicalgia: Secondary | ICD-10-CM

## 2019-05-20 MED ORDER — BUPROPION HCL ER (XL) 300 MG PO TB24: ORAL_TABLET | ORAL | 0 refills | Status: DC

## 2019-05-20 MED ORDER — CYCLOBENZAPRINE HCL 10 MG PO TABS: ORAL_TABLET | ORAL | 0 refills | Status: DC

## 2019-06-14 ENCOUNTER — Other Ambulatory Visit: Payer: Self-pay | Admitting: Internal Medicine

## 2019-06-14 DIAGNOSIS — J0140 Acute pansinusitis, unspecified: Secondary | ICD-10-CM

## 2019-06-14 DIAGNOSIS — J0410 Acute tracheitis without obstruction: Secondary | ICD-10-CM

## 2019-06-14 MED ORDER — PROMETHAZINE-DM 6.25-15 MG/5ML PO SYRP: ORAL_SOLUTION | ORAL | 0 refills | Status: DC

## 2019-07-08 ENCOUNTER — Other Ambulatory Visit: Payer: Self-pay | Admitting: Internal Medicine

## 2019-07-08 DIAGNOSIS — J0410 Acute tracheitis without obstruction: Secondary | ICD-10-CM

## 2019-07-08 DIAGNOSIS — J0140 Acute pansinusitis, unspecified: Secondary | ICD-10-CM

## 2019-07-08 MED ORDER — PROMETHAZINE-DM 6.25-15 MG/5ML PO SYRP: ORAL_SOLUTION | ORAL | 0 refills | Status: DC

## 2019-07-10 ENCOUNTER — Other Ambulatory Visit: Payer: Self-pay | Admitting: Internal Medicine

## 2019-07-10 ENCOUNTER — Ambulatory Visit: Payer: 59 | Admitting: Internal Medicine

## 2019-07-10 DIAGNOSIS — M542 Cervicalgia: Secondary | ICD-10-CM

## 2019-07-10 MED ORDER — CYCLOBENZAPRINE HCL 10 MG PO TABS: ORAL_TABLET | ORAL | 0 refills | Status: DC

## 2019-07-18 ENCOUNTER — Other Ambulatory Visit: Payer: Self-pay | Admitting: Internal Medicine

## 2019-07-18 DIAGNOSIS — E669 Obesity, unspecified: Secondary | ICD-10-CM

## 2019-07-24 ENCOUNTER — Other Ambulatory Visit: Payer: Self-pay | Admitting: Internal Medicine

## 2019-07-24 DIAGNOSIS — F419 Anxiety disorder, unspecified: Secondary | ICD-10-CM

## 2019-07-24 MED ORDER — BUSPIRONE HCL 15 MG PO TABS: ORAL_TABLET | ORAL | 1 refills | Status: DC

## 2019-08-02 ENCOUNTER — Other Ambulatory Visit: Payer: Self-pay | Admitting: Internal Medicine

## 2019-08-02 DIAGNOSIS — J0410 Acute tracheitis without obstruction: Secondary | ICD-10-CM

## 2019-08-02 DIAGNOSIS — J0140 Acute pansinusitis, unspecified: Secondary | ICD-10-CM

## 2019-08-02 MED ORDER — PROMETHAZINE-DM 6.25-15 MG/5ML PO SYRP: ORAL_SOLUTION | ORAL | 0 refills | Status: DC

## 2019-08-19 ENCOUNTER — Other Ambulatory Visit: Payer: Self-pay | Admitting: Internal Medicine

## 2019-08-19 DIAGNOSIS — E669 Obesity, unspecified: Secondary | ICD-10-CM

## 2019-08-19 DIAGNOSIS — M542 Cervicalgia: Secondary | ICD-10-CM

## 2019-08-19 MED ORDER — CYCLOBENZAPRINE HCL 10 MG PO TABS: ORAL_TABLET | ORAL | 0 refills | Status: DC

## 2019-08-19 MED ORDER — TOPIRAMATE 50 MG PO TABS: ORAL_TABLET | ORAL | 1 refills | Status: DC

## 2019-08-21 ENCOUNTER — Other Ambulatory Visit: Payer: Self-pay | Admitting: Internal Medicine

## 2019-08-21 DIAGNOSIS — E782 Mixed hyperlipidemia: Secondary | ICD-10-CM

## 2019-08-21 MED ORDER — ATORVASTATIN CALCIUM 20 MG PO TABS: ORAL_TABLET | ORAL | 1 refills | Status: DC

## 2019-08-22 ENCOUNTER — Ambulatory Visit: Payer: 59 | Admitting: Internal Medicine

## 2019-09-11 ENCOUNTER — Other Ambulatory Visit: Payer: Self-pay | Admitting: Internal Medicine

## 2019-09-11 DIAGNOSIS — J0410 Acute tracheitis without obstruction: Secondary | ICD-10-CM

## 2019-09-11 DIAGNOSIS — J0140 Acute pansinusitis, unspecified: Secondary | ICD-10-CM

## 2019-09-11 DIAGNOSIS — M542 Cervicalgia: Secondary | ICD-10-CM

## 2019-09-12 ENCOUNTER — Other Ambulatory Visit: Payer: Self-pay | Admitting: Internal Medicine

## 2019-09-12 DIAGNOSIS — F32A Depression, unspecified: Secondary | ICD-10-CM

## 2019-09-12 MED ORDER — BUPROPION HCL ER (XL) 300 MG PO TB24: ORAL_TABLET | ORAL | 0 refills | Status: DC

## 2019-09-22 ENCOUNTER — Encounter: Payer: Self-pay | Admitting: Internal Medicine

## 2019-09-22 NOTE — Patient Instructions (Signed)

## 2019-09-22 NOTE — Progress Notes (Signed)
History of Present Illness:       This very nice 54 y.o.  MWF presents for 6 month follow up with HTN, HLD, Pre-Diabetes and Vitamin D Deficiency.  Patient's GERD is controlled by diet.       Patient is treated for HTN (2012) & BP has been controlled at home. Today's BP is at goal - 126/86. Patient has had no complaints of any cardiac type chest pain, palpitations, dyspnea / orthopnea / PND, dizziness, claudication, or dependent edema.      Hyperlipidemia is controlled with diet & meds. Patient denies myalgias or other med SE's. Last Lipids were at goal:  Lab Results  Component Value Date   CHOL 159 05/15/2019   HDL 48 (L) 05/15/2019   LDLCALC 91 05/15/2019   TRIG 106 05/15/2019   CHOLHDL 3.3 05/15/2019    Also, the patient is overweight  (BMI 29.85) and is followed expectantly for glucose intolerance.  Patient  has had no symptoms of reactive hypoglycemia, diabetic polys, paresthesias or visual blurring.  Last A1c was at goal:  Lab Results  Component Value Date   HGBA1C 5.2 01/03/2019   Wt Readings from Last 3 Encounters:  09/23/19 163 lb 3.2 oz (74 kg)  05/15/19 175 lb 12.8 oz (79.7 kg)  01/03/19 179 lb 6.4 oz (81.4 kg)            Further, the patient also has history of Vitamin D Deficiency("27" / 2015)  and supplements vitamin D without any suspected side-effects. Last vitamin D was at goal:  Lab Results  Component Value Date   VD25OH 79 01/03/2019    Current Outpatient Medications on File Prior to Visit  Medication Sig  . aspirin 81 MG chewable tablet Chew by mouth daily.  Marland Kitchen atorvastatin (LIPITOR) 20 MG tablet Take 1 tablet Daily for Cholesterol  . buPROPion (WELLBUTRIN XL) 300 MG 24 hr tablet Take 1 tablet every morning for Mood and Focus and Concentration  . busPIRone (BUSPAR) 15 MG tablet Take 1 tablet 3 x  /day for anxiety.  . calcium citrate (CALCITRATE - DOSED IN MG ELEMENTAL CALCIUM) 950 MG tablet Take 200 mg of elemental calcium by mouth daily.  .  citalopram (CELEXA) 40 MG tablet Take 1 tablet Daily for Anxiety & Mood  . cyclobenzaprine (FLEXERIL) 10 MG tablet Take  1/2 to 1 tablet      2 to 3 x     /day ONLY if needed for cough  . Fexofenadine HCl (ALLEGRA PO) Take by mouth.  . gabapentin (NEURONTIN) 300 MG capsule Take 1 capsule 3 x /day for Pain or Cough  . ibuprofen (ADVIL,MOTRIN) 600 MG tablet TAKE 1 TABLET BY MOUTH EVERY 6 HOURS AS NEEDED  . phentermine (ADIPEX-P) 37.5 MG tablet TAKE 1/2 TO 1 TABLET BY MOUTH EVERY MORNING FOR DIETING AND WEIGHT LOSS  . Prenatal Vit-Fe Fumarate-FA (M-VIT PO) Take 1 tablet by mouth daily.  Marland Kitchen PROAIR HFA 108 (90 Base) MCG/ACT inhaler INHALE 1 PUFF PO Q 4 H (Patient taking differently: INHALE 1 PUFF PO Q 4 H PRN)  . promethazine-dextromethorphan (PROMETHAZINE-DM) 6.25-15 MG/5ML syrup Use 1 to 2 teaspoonfuls every 4 hours ONLY if needed for cough  . topiramate (TOPAMAX) 50 MG tablet Take 1/2 to 1 tablet 2 x /day at Suppertime & Bedtime for Dieting & Weight Loss  . vitamin B-12 (CYANOCOBALAMIN) 100 MCG tablet Take 100 mcg by mouth daily.   . Vitamin D, Ergocalciferol, (DRISDOL) 1.25 MG (50000  UT) CAPS capsule Take 1 capsule every week for severe Vit DDeficiency   No current facility-administered medications on file prior to visit.    No Known Allergies  PMHx:   Past Medical History:  Diagnosis Date  . Allergy   . Asthma   . GERD (gastroesophageal reflux disease) 2005  . 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 Hyperlipidemia 2012  . Hypertension 2012  . Vitamin D deficiency     Immunization History  Administered Date(s) Administered  . Influenza Inj Mdck Quad With Preservative 11/08/2016, 01/03/2019  . Influenza,inj,Quad PF,6+ Mos 10/27/2017  . PPD Test 11/08/2016, 12/14/2017, 01/03/2019  . Pneumococcal-Unspecified 02/22/2015  . Td 02/22/2012  . Zoster 02/22/2015    Past Surgical History:  Procedure Laterality Date  . COLONOSCOPY N/A 2015   screening at age 42 - Negative  .  FRACTURE SURGERY  1988   facial Fx , nose, Ankle both Arms  . l ulnar nonunion  1992  . NASAL SEPTUM SURGERY  1991  . nonunion 5th metatarsal  2008  . r sub talar fusion  2008  . redo subtalar fusion  2013  . rt ankle surg for non union  2013  . SPINE SURGERY  2011   Cx5 - Cx6 fusion w/plate/screws  . TONSILLECTOMY AND ADENOIDECTOMY  1972    FHx:    Reviewed / unchanged  SHx:    Reviewed / unchanged   Systems Review:  Constitutional: Denies fever, chills, wt changes, headaches, insomnia, fatigue, night sweats, change in appetite. Eyes: Denies redness, blurred vision, diplopia, discharge, itchy, watery eyes.  ENT: Denies discharge, congestion, post nasal drip, epistaxis, sore throat, earache, hearing loss, dental pain, tinnitus, vertigo, sinus pain, snoring.  CV: Denies chest pain, palpitations, irregular heartbeat, syncope, dyspnea, diaphoresis, orthopnea, PND, claudication or edema. Respiratory: denies cough, dyspnea, DOE, pleurisy, hoarseness, laryngitis, wheezing.  Gastrointestinal: Denies dysphagia, odynophagia, heartburn, reflux, water brash, abdominal pain or cramps, nausea, vomiting, bloating, diarrhea, constipation, hematemesis, melena, hematochezia  or hemorrhoids. Genitourinary: Denies dysuria, frequency, urgency, nocturia, hesitancy, discharge, hematuria or flank pain. Musculoskeletal: Denies arthralgias, myalgias, stiffness, jt. swelling, pain, limping or strain/sprain.  Skin: Denies pruritus, rash, hives, warts, acne, eczema or change in skin lesion(s). Neuro: No weakness, tremor, incoordination, spasms, paresthesia or pain. Psychiatric: Denies confusion, memory loss or sensory loss. Endo: Denies change in weight, skin or hair change.  Heme/Lymph: No excessive bleeding, bruising or enlarged lymph nodes.  Physical Exam  BP 126/86   Pulse 84   Temp (!) 97.2 F (36.2 C)   Resp 16   Ht 5\' 2"  (1.575 m)   Wt 163 lb 3.2 oz (74 kg)   BMI 29.85 kg/m   Appears  well  nourished, well groomed  and in no distress.  Eyes: PERRLA, EOMs, conjunctiva no swelling or erythema. Sinuses: No frontal/maxillary tenderness ENT/Mouth: EAC's clear, TM's nl w/o erythema, bulging. Nares clear w/o erythema, swelling, exudates. Oropharynx clear without erythema or exudates. Oral hygiene is good. Tongue normal, non obstructing. Hearing intact.  Neck: Supple. Thyroid not palpable. Car 2+/2+ without bruits, nodes or JVD. Chest: Respirations nl with BS clear & equal w/o rales, rhonchi, wheezing or stridor.  Cor: Heart sounds normal w/ regular rate and rhythm without sig. murmurs, gallops, clicks or rubs. Peripheral pulses normal and equal  without edema.  Abdomen: Soft & bowel sounds normal. Non-tender w/o guarding, rebound, hernias, masses or organomegaly.  Lymphatics: Unremarkable.  Musculoskeletal: Full ROM all peripheral extremities, joint stability, 5/5 strength and normal  gait.  Skin: Warm, dry without exposed rashes, lesions or ecchymosis apparent.  Neuro: Cranial nerves intact, reflexes equal bilaterally. Sensory-motor testing grossly intact. Tendon reflexes grossly intact.  Pysch: Alert & oriented x 3.  Insight and judgement nl & appropriate. No ideations.  Assessment and Plan:  1. Essential hypertension  - Continue medication, monitor blood pressure at home.  - Continue DASH diet.  Reminder to go to the ER if any CP,  SOB, nausea, dizziness, severe HA, changes vision/speech.  - CBC with Differential/Platelet - COMPLETE METABOLIC PANEL WITH GFR - Magnesium - TSH  2. Hyperlipidemia, mixed  - Continue diet/meds, exercise,& lifestyle modifications.  - Continue monitor periodic cholesterol/liver & renal functions   - Lipid panel - TSH  3. Abnormal glucose  - Lifestyle modifications.  - Monitor appropriate labs. - Continue supplementation.  - Hemoglobin A1c - Insulin, random  4. Vitamin D deficiency  - Continue diet, exercise   - VITAMIN D 25  Hydroxy  5. Medication management  - CBC with Differential/Platelet - COMPLETE METABOLIC PANEL WITH GFR - Magnesium - Lipid panel - TSH - Hemoglobin A1c - Insulin, random - VITAMIN D 25 Hydroxy        Discussed  regular exercise, BP monitoring, weight control to achieve/maintain BMI less than 25 and discussed med and SE's. Recommended labs to assess and monitor clinical status with further disposition pending results of labs.  I discussed the assessment and treatment plan with the patient. The patient was provided an opportunity to ask questions and all were answered. The patient agreed with the plan and demonstrated an understanding of the instructions.  I provided over 30 minutes of exam, counseling, chart review and  complex critical decision making.       The patient was advised to call back or seek an in-person evaluation if the symptoms worsen or if the condition fails to improve as anticipated.   Kirtland Bouchard, MD

## 2019-09-23 ENCOUNTER — Other Ambulatory Visit: Payer: Self-pay

## 2019-09-23 ENCOUNTER — Ambulatory Visit: Payer: 59 | Admitting: Internal Medicine

## 2019-09-23 VITALS — BP 126/86 | HR 84 | Temp 97.2°F | Resp 16 | Ht 62.0 in | Wt 163.2 lb

## 2019-09-23 DIAGNOSIS — E559 Vitamin D deficiency, unspecified: Secondary | ICD-10-CM

## 2019-09-23 DIAGNOSIS — E782 Mixed hyperlipidemia: Secondary | ICD-10-CM | POA: Diagnosis not present

## 2019-09-23 DIAGNOSIS — I10 Essential (primary) hypertension: Secondary | ICD-10-CM | POA: Diagnosis not present

## 2019-09-23 DIAGNOSIS — R7309 Other abnormal glucose: Secondary | ICD-10-CM | POA: Diagnosis not present

## 2019-09-23 DIAGNOSIS — Z79899 Other long term (current) drug therapy: Secondary | ICD-10-CM

## 2019-09-24 LAB — TSH: TSH: 2.2 mIU/L (ref 0.40–4.50)

## 2019-09-24 LAB — CBC WITH DIFFERENTIAL/PLATELET
Absolute Monocytes: 302 cells/uL (ref 200–950)
Basophils Absolute: 40 cells/uL (ref 0–200)
Basophils Relative: 0.7 %
Eosinophils Absolute: 63 cells/uL (ref 15–500)
Eosinophils Relative: 1.1 %
HCT: 39.9 % (ref 35.0–45.0)
Hemoglobin: 13.2 g/dL (ref 11.7–15.5)
Lymphs Abs: 1322 cells/uL (ref 850–3900)
MCH: 30.7 pg (ref 27.0–33.0)
MCHC: 33.1 g/dL (ref 32.0–36.0)
MCV: 92.8 fL (ref 80.0–100.0)
MPV: 11.5 fL (ref 7.5–12.5)
Monocytes Relative: 5.3 %
Neutro Abs: 3973 cells/uL (ref 1500–7800)
Neutrophils Relative %: 69.7 %
Platelets: 204 10*3/uL (ref 140–400)
RBC: 4.3 10*6/uL (ref 3.80–5.10)
RDW: 13 % (ref 11.0–15.0)
Total Lymphocyte: 23.2 %
WBC: 5.7 10*3/uL (ref 3.8–10.8)

## 2019-09-24 LAB — VITAMIN D 25 HYDROXY (VIT D DEFICIENCY, FRACTURES): Vit D, 25-Hydroxy: 90 ng/mL (ref 30–100)

## 2019-09-24 LAB — COMPLETE METABOLIC PANEL WITH GFR
AG Ratio: 1.7 (calc) (ref 1.0–2.5)
ALT: 10 U/L (ref 6–29)
AST: 16 U/L (ref 10–35)
Albumin: 4.2 g/dL (ref 3.6–5.1)
Alkaline phosphatase (APISO): 137 U/L (ref 37–153)
BUN: 9 mg/dL (ref 7–25)
CO2: 26 mmol/L (ref 20–32)
Calcium: 9.4 mg/dL (ref 8.6–10.4)
Chloride: 106 mmol/L (ref 98–110)
Creat: 0.88 mg/dL (ref 0.50–1.05)
GFR, Est African American: 85 mL/min/{1.73_m2} (ref >=60)
GFR, Est Non African American: 73 mL/min/{1.73_m2} (ref >=60)
Globulin: 2.5 g/dL (calc) (ref 1.9–3.7)
Glucose, Bld: 91 mg/dL (ref 65–99)
Potassium: 3.8 mmol/L (ref 3.5–5.3)
Sodium: 138 mmol/L (ref 135–146)
Total Bilirubin: 0.4 mg/dL (ref 0.2–1.2)
Total Protein: 6.7 g/dL (ref 6.1–8.1)

## 2019-09-24 LAB — LIPID PANEL
Cholesterol: 147 mg/dL (ref <200)
HDL: 46 mg/dL — ABNORMAL LOW (ref >=50)
LDL Cholesterol (Calc): 80 mg/dL (calc)
Non-HDL Cholesterol (Calc): 101 mg/dL (calc) (ref <130)
Total CHOL/HDL Ratio: 3.2 (calc) (ref <5.0)
Triglycerides: 110 mg/dL (ref <150)

## 2019-09-24 LAB — HEMOGLOBIN A1C
Hgb A1c MFr Bld: 5.2 % of total Hgb (ref <5.7)
Mean Plasma Glucose: 103 (calc)
eAG (mmol/L): 5.7 (calc)

## 2019-09-24 LAB — INSULIN, RANDOM: Insulin: 5.5 u[IU]/mL

## 2019-09-24 LAB — MAGNESIUM: Magnesium: 2 mg/dL (ref 1.5–2.5)

## 2019-09-24 NOTE — Progress Notes (Signed)
=========================================================  -   Total Chol = 147 and LDL Chol = 80 - Both  Excellent   - Very low risk for Heart Attack  / Stroke =========================================================  - A1c - Normal - Great - No Diabetes =========================================================  -  Vitamin D = 90 - Excellent  =========================================================  - All Else - CBC - Kidneys - Electrolytes - Liver - Magnesium & Thyroid    - all  Normal / OK =========================================================

## 2019-10-02 ENCOUNTER — Other Ambulatory Visit: Payer: Self-pay | Admitting: Internal Medicine

## 2019-10-02 DIAGNOSIS — J0140 Acute pansinusitis, unspecified: Secondary | ICD-10-CM

## 2019-10-02 DIAGNOSIS — J0410 Acute tracheitis without obstruction: Secondary | ICD-10-CM

## 2019-10-15 ENCOUNTER — Other Ambulatory Visit: Payer: Self-pay | Admitting: Adult Health

## 2019-10-15 DIAGNOSIS — E669 Obesity, unspecified: Secondary | ICD-10-CM

## 2019-11-18 ENCOUNTER — Other Ambulatory Visit: Payer: Self-pay | Admitting: Internal Medicine

## 2019-11-18 DIAGNOSIS — E559 Vitamin D deficiency, unspecified: Secondary | ICD-10-CM

## 2019-11-18 DIAGNOSIS — M542 Cervicalgia: Secondary | ICD-10-CM

## 2019-12-20 ENCOUNTER — Other Ambulatory Visit: Payer: Self-pay | Admitting: Internal Medicine

## 2019-12-20 DIAGNOSIS — Z1231 Encounter for screening mammogram for malignant neoplasm of breast: Secondary | ICD-10-CM

## 2019-12-24 NOTE — Progress Notes (Signed)
FOLLOW UP  Assessment and Plan:   Hypertension Well controlled off of meds at this time Monitor blood pressure at home; patient to call if consistently greater than 130/80 Continue DASH diet.   Reminder to go to the ER if any CP, SOB, nausea, dizziness, severe HA, changes vision/speech, left arm numbness and tingling and jaw pain.  Cholesterol At goal on statin; continue medication  Continue low cholesterol diet and exercise.  Check lipid panel.   Other abnormal glucose Discussed risks associated with elevated glucose Recommend prudent diet, portion control, maintenance of weight in normal range, regular aerobic exercise Defer A1C; check annually - monitor sugars by CMP  GERD Off of meds; reports well controlled sx Discussed diet, avoiding triggers and other lifestyle changes  Overweight Long discussion about weight loss, diet, and exercise Recommended diet heavy in fruits and veggies and low in animal meats, cheeses, and dairy products, appropriate calorie intake Discussed appropriate weight for height Patient on phentermine/topamax with benefit and no SE, taking drug breaks; continue close follow up.  Discussed adding exercise focusing on muscle building, reduce processed carbohydrate, increase protein, fresh fruits/veggies, nuts/seeds, etc Follow up in 3 months  Depression/anxiety She reports fairly controlled by current regimen; discussed risk of SS with current agents; using buspar PRN only/occasionally; continue to monitor closely  Lifestyle discussed: diet/exerise, sleep hygiene, stress management, hydration  Vitamin D Def/ osteoporosis prevention At goal at recent check; continue supplement for goal range 60-100 Defer vitamin D level   Continue diet and meds as discussed. Further disposition pending results of labs. Discussed med's effects and SE's.   Over 30 minutes of exam, counseling, chart review, and critical decision making was performed.   Future  Appointments  Date Time Provider [place]  01/27/2020  4:10 PM [facility] 2 [facility]  04/02/2020  2:00 PM McClanahan, Kyra, NP [facility] None    ----------------------------------------------------------------------------------------------------------------------  HPI 54 y.o. female  presents for 3 month follow up on hypertension, cholesterol, GERD, blood sugar monitoring, weight, depression and vitamin D deficiency.   she is prescribed phentermine and topamax for weight loss.  While on the medication they have lost 2 lbs since last visit. They deny palpitations, anxiety, trouble sleeping, elevated BP.   BMI is Body mass index is 29.45 kg/m., she has been working on diet, no particular weight goal, generally trying to eat better, trying to move more.  Wt Readings from Last 3 Encounters:  12/25/19 161 lb (73 kg)  09/23/19 163 lb 3.2 oz (74 kg)  05/15/19 175 lb 12.8 oz (79.7 kg)   she has ongoing depression with anxious features and is currently on celexa 40 mg daily, wellbutrin XR 300 mg daily, and she is on buspar 15mg  once every 2-3 days, reports symptoms are well controlled on current regimen. Have discussed   she has a diagnosis of GERD which improved on nexium 40 mg, was able to taper off without recurrent symptoms.   Her blood pressure has been controlled at home, today their BP is BP: 120/78  She does workout. She denies chest pain, shortness of breath, dizziness.   She is on cholesterol medication (atorvastatin 20 mg daily) and denies myalgias. Her cholesterol is at goal. The cholesterol last visit was:   Lab Results  Component Value Date   CHOL 147 09/23/2019   HDL 46 (L) 09/23/2019   LDLCALC 80 09/23/2019   TRIG 110 09/23/2019   CHOLHDL 3.2 09/23/2019    She has been working on diet and  exercise for blood sugar control, and denies increased appetite, nausea, paresthesia of the feet, polydipsia, polyuria, visual disturbances and vomiting. Last A1C in  the office was:  Lab Results  Component Value Date   HGBA1C 5.2 09/23/2019   Patient is on Vitamin D supplement  Lab Results  Component Value Date   VD25OH 90 09/23/2019      Current Medications:  Current Outpatient Medications on File Prior to Visit  Medication Sig  . aspirin 81 MG chewable tablet Chew by mouth daily.  Marland Kitchen atorvastatin (LIPITOR) 20 MG tablet Take 1 tablet Daily for Cholesterol  . buPROPion (WELLBUTRIN XL) 300 MG 24 hr tablet Take 1 tablet every morning for Mood and Focus and Concentration  . calcium citrate (CALCITRATE - DOSED IN MG ELEMENTAL CALCIUM) 950 MG tablet Take 200 mg of elemental calcium by mouth daily.  . citalopram (CELEXA) 40 MG tablet Take 1 tablet Daily for Anxiety & Mood  . cyclobenzaprine (FLEXERIL) 10 MG tablet Take     1/2 to 1 tablet      2 to 3  x /day       as needed for  Muscle Spasm  . Fexofenadine HCl (ALLEGRA PO) Take by mouth.  Marland Kitchen ibuprofen (ADVIL,MOTRIN) 600 MG tablet TAKE 1 TABLET BY MOUTH EVERY 6 HOURS AS NEEDED  . Multiple Vitamins-Minerals (MULTIVITAMIN ADULTS PO) Take by mouth daily.  . phentermine (ADIPEX-P) 37.5 MG tablet Take 1/2 to 1 tablet every Morning for Dieting & Weight Loss  . PROAIR HFA 108 (90 Base) MCG/ACT inhaler INHALE 1 PUFF PO Q 4 H (Patient taking differently: INHALE 1 PUFF PO Q 4 H PRN)  . promethazine-dextromethorphan (PROMETHAZINE-DM) 6.25-15 MG/5ML syrup TAKE 5 TO 10 ML BY MOUTH EVERY 4 HOURS AS NEEDED FOR COUGH  . topiramate (TOPAMAX) 50 MG tablet Take 1/2 to 1 tablet 2 x /day at Suppertime & Bedtime for Dieting & Weight Loss  . vitamin B-12 (CYANOCOBALAMIN) 100 MCG tablet Take 100 mcg by mouth daily.   . Vitamin D, Ergocalciferol, (DRISDOL) 1.25 MG (50000 UNIT) CAPS capsule Take     1 capsule     every 7 days       for severe Vit D Deficiency   No current facility-administered medications on file prior to visit.     Allergies: No Known Allergies   Medical History:  Past Medical History:  Diagnosis Date  .  Allergy   . Asthma   . GERD (gastroesophageal reflux disease) 2005  . 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 Hyperlipidemia 2012  . Hypertension 2012  . Vitamin D deficiency    Family history- Reviewed and unchanged Social history- Reviewed and unchanged   Review of Systems:  Review of Systems  Constitutional: Negative for malaise/fatigue and weight loss.  HENT: Negative for hearing loss and tinnitus.   Eyes: Negative for blurred vision and double vision.  Respiratory: Negative for cough, shortness of breath and wheezing.   Cardiovascular: Negative for chest pain, palpitations, orthopnea, claudication and leg swelling.  Gastrointestinal: Negative for abdominal pain, blood in stool, constipation, diarrhea, heartburn, melena, nausea and vomiting.  Genitourinary: Negative.   Musculoskeletal: Negative for joint pain and myalgias.  Skin: Negative for rash.  Neurological: Negative for dizziness, tingling, sensory change, weakness and headaches.  Endo/Heme/Allergies: Negative for polydipsia.  Psychiatric/Behavioral: Negative for depression and substance abuse. The patient is nervous/anxious (chronic since childhood, improved with medication).   All other systems reviewed and are negative.    Physical  Exam: BP 120/78   Pulse 73   Temp (!) 97.3 F (36.3 C)   Wt 161 lb (73 kg)   SpO2 92%   BMI 29.45 kg/m  Wt Readings from Last 3 Encounters:  12/25/19 161 lb (73 kg)  09/23/19 163 lb 3.2 oz (74 kg)  05/15/19 175 lb 12.8 oz (79.7 kg)   General Appearance: Well nourished, in no apparent distress. Eyes: PERRLA, EOMs, conjunctiva no swelling or erythema Sinuses: No Frontal/maxillary tenderness ENT/Mouth: Ext aud canals clear, TMs without erythema, bulging. No erythema, swelling, or exudate on post pharynx.  Tonsils not swollen or erythematous. Hearing normal.  Neck: Supple, thyroid normal.  Respiratory: Respiratory effort normal, BS equal bilaterally without rales, rhonchi,  wheezing or stridor.  Cardio: RRR with no MRGs. Brisk peripheral pulses without edema.  Abdomen: Soft, + BS.  Non tender, no guarding, rebound, hernias, masses. Lymphatics: Non tender without lymphadenopathy.  Musculoskeletal: Full ROM, 5/5 strength, Normal gait Skin: Warm, dry without rashes, lesions, ecchymosis.  Neuro: Cranial nerves intact. No cerebellar symptoms.  Psych: Awake and oriented X 3, mildly anxious affect, Insight and Judgment appropriate.    Izora Ribas, NP 4:08 PM [facility]

## 2019-12-25 ENCOUNTER — Encounter: Payer: Self-pay | Admitting: Adult Health

## 2019-12-25 ENCOUNTER — Other Ambulatory Visit: Payer: Self-pay

## 2019-12-25 ENCOUNTER — Ambulatory Visit: Payer: 59 | Admitting: Adult Health

## 2019-12-25 VITALS — BP 120/78 | HR 73 | Temp 97.3°F | Wt 161.0 lb

## 2019-12-25 DIAGNOSIS — E559 Vitamin D deficiency, unspecified: Secondary | ICD-10-CM

## 2019-12-25 DIAGNOSIS — E782 Mixed hyperlipidemia: Secondary | ICD-10-CM | POA: Diagnosis not present

## 2019-12-25 DIAGNOSIS — E669 Obesity, unspecified: Secondary | ICD-10-CM

## 2019-12-25 DIAGNOSIS — R7309 Other abnormal glucose: Secondary | ICD-10-CM

## 2019-12-25 DIAGNOSIS — F419 Anxiety disorder, unspecified: Secondary | ICD-10-CM

## 2019-12-25 DIAGNOSIS — I700 Atherosclerosis of aorta: Secondary | ICD-10-CM | POA: Diagnosis not present

## 2019-12-25 DIAGNOSIS — E663 Overweight: Secondary | ICD-10-CM

## 2019-12-25 DIAGNOSIS — F325 Major depressive disorder, single episode, in full remission: Secondary | ICD-10-CM

## 2019-12-25 DIAGNOSIS — I10 Essential (primary) hypertension: Secondary | ICD-10-CM | POA: Diagnosis not present

## 2019-12-25 MED ORDER — BUSPIRONE HCL 15 MG PO TABS: ORAL_TABLET | ORAL | 1 refills | Status: DC

## 2019-12-25 NOTE — Patient Instructions (Addendum)
Goals    . DIET - INCREASE WATER INTAKE     65-80+ fluid ounces     . Exercise 150 min/wk Moderate Activity    . Weight (lb) < 150 lb (68 kg)         High-Fiber Diet Fiber, also called dietary fiber, is a type of carbohydrate that is found in fruits, vegetables, whole grains, and beans. A high-fiber diet can have many health benefits. Your health care provider may recommend a high-fiber diet to help:  Prevent constipation. Fiber can make your bowel movements more regular.  Lower your cholesterol.  Relieve the following conditions: ? Swelling of veins in the anus (hemorrhoids). ? Swelling and irritation (inflammation) of specific areas of the digestive tract (uncomplicated diverticulosis). ? A problem of the large intestine (colon) that sometimes causes pain and diarrhea (irritable bowel syndrome, IBS).  Prevent overeating as part of a weight-loss plan.  Prevent heart disease, type 2 diabetes, and certain cancers. What is my plan? The recommended daily fiber intake in grams (g) includes:  38 g for men age 78 or younger.  30 g for men over age 15.  29 g for women age 69 or younger.  21 g for women over age 68. You can get the recommended daily intake of dietary fiber by:  Eating a variety of fruits, vegetables, grains, and beans.  Taking a fiber supplement, if it is not possible to get enough fiber through your diet. What do I need to know about a high-fiber diet?  It is better to get fiber through food sources rather than from fiber supplements. There is not a lot of research about how effective supplements are.  Always check the fiber content on the nutrition facts label of any prepackaged food. Look for foods that contain 5 g of fiber or more per serving.  Talk with a diet and nutrition specialist (dietitian) if you have questions about specific foods that are recommended or not recommended for your medical condition, especially if those foods are not listed  below.  Gradually increase how much fiber you consume. If you increase your intake of dietary fiber too quickly, you may have bloating, cramping, or gas.  Drink plenty of water. Water helps you to digest fiber. What are tips for following this plan?  Eat a wide variety of high-fiber foods.  Make sure that half of the grains that you eat each day are whole grains.  Eat breads and cereals that are made with whole-grain flour instead of refined flour or white flour.  Eat brown rice, bulgur wheat, or millet instead of white rice.  Start the day with a breakfast that is high in fiber, such as a cereal that contains 5 g of fiber or more per serving.  Use beans in place of meat in soups, salads, and pasta dishes.  Eat high-fiber snacks, such as berries, raw vegetables, nuts, and popcorn.  Choose whole fruits and vegetables instead of processed forms like juice or sauce. What foods can I eat?  Fruits Berries. Pears. Apples. Oranges. Avocado. Prunes and raisins. Dried figs. Vegetables Sweet potatoes. Spinach. Kale. Artichokes. Cabbage. Broccoli. Cauliflower. Green peas. Carrots. Squash. Grains Whole-grain breads. Multigrain cereal. Oats and oatmeal. Brown rice. Barley. Bulgur wheat. [place]. Quinoa. Bran muffins. Popcorn. Rye wafer crackers. Meats and other proteins Navy, kidney, and pinto beans. Soybeans. Split peas. Lentils. Nuts and seeds. Dairy Fiber-fortified yogurt. Beverages Fiber-fortified soy milk. Fiber-fortified orange juice. Other foods Fiber bars. The items listed above may not  be a complete list of recommended foods and beverages. Contact a dietitian for more options. What foods are not recommended? Fruits Fruit juice. Cooked, strained fruit. Vegetables Fried potatoes. Canned vegetables. Well-cooked vegetables. Grains White bread. Pasta made with refined flour. White rice. Meats and other proteins Fatty cuts of meat. Fried chicken or fried fish. Dairy Milk.  Yogurt. Cream cheese. Sour cream. Fats and oils Butters. Beverages Soft drinks. Other foods Cakes and pastries. The items listed above may not be a complete list of foods and beverages to avoid. Contact a dietitian for more information. Summary  Fiber is a type of carbohydrate. It is found in fruits, vegetables, whole grains, and beans.  There are many health benefits of eating a high-fiber diet, such as preventing constipation, lowering blood cholesterol, helping with weight loss, and reducing your risk of heart disease, diabetes, and certain cancers.  Gradually increase your intake of fiber. Increasing too fast can result in cramping, bloating, and gas. Drink plenty of water while you increase your fiber.  The best sources of fiber include whole fruits and vegetables, whole grains, nuts, seeds, and beans. This information is not intended to replace advice given to you by your health care provider. Make sure you discuss any questions you have with your health care provider. Document Revised: 12/12/2016 Document Reviewed: 12/12/2016 Elsevier Patient Education  2020 [employer].

## 2019-12-26 LAB — COMPLETE METABOLIC PANEL WITH GFR
AG Ratio: 1.7 (calc) (ref 1.0–2.5)
ALT: 15 U/L (ref 6–29)
AST: 16 U/L (ref 10–35)
Albumin: 4.3 g/dL (ref 3.6–5.1)
Alkaline phosphatase (APISO): 144 U/L (ref 37–153)
BUN: 8 mg/dL (ref 7–25)
CO2: 28 mmol/L (ref 20–32)
Calcium: 9.5 mg/dL (ref 8.6–10.4)
Chloride: 104 mmol/L (ref 98–110)
Creat: 0.69 mg/dL (ref 0.50–1.05)
GFR, Est African American: 113 mL/min/{1.73_m2} (ref >=60)
GFR, Est Non African American: 97 mL/min/{1.73_m2} (ref >=60)
Globulin: 2.6 g/dL (calc) (ref 1.9–3.7)
Glucose, Bld: 73 mg/dL (ref 65–99)
Potassium: 4.2 mmol/L (ref 3.5–5.3)
Sodium: 140 mmol/L (ref 135–146)
Total Bilirubin: 0.3 mg/dL (ref 0.2–1.2)
Total Protein: 6.9 g/dL (ref 6.1–8.1)

## 2019-12-26 LAB — CBC WITH DIFFERENTIAL/PLATELET
Absolute Monocytes: 442 cells/uL (ref 200–950)
Basophils Absolute: 78 cells/uL (ref 0–200)
Basophils Relative: 1.4 %
Eosinophils Absolute: 90 cells/uL (ref 15–500)
Eosinophils Relative: 1.6 %
HCT: 39.1 % (ref 35.0–45.0)
Hemoglobin: 13.2 g/dL (ref 11.7–15.5)
Lymphs Abs: 2111 cells/uL (ref 850–3900)
MCH: 31.1 pg (ref 27.0–33.0)
MCHC: 33.8 g/dL (ref 32.0–36.0)
MCV: 92 fL (ref 80.0–100.0)
MPV: 10.5 fL (ref 7.5–12.5)
Monocytes Relative: 7.9 %
Neutro Abs: 2878 cells/uL (ref 1500–7800)
Neutrophils Relative %: 51.4 %
Platelets: 258 10*3/uL (ref 140–400)
RBC: 4.25 10*6/uL (ref 3.80–5.10)
RDW: 12.1 % (ref 11.0–15.0)
Total Lymphocyte: 37.7 %
WBC: 5.6 10*3/uL (ref 3.8–10.8)

## 2019-12-26 LAB — TSH: TSH: 2.29 mIU/L (ref 0.40–4.50)

## 2019-12-26 LAB — LIPID PANEL
Cholesterol: 196 mg/dL (ref <200)
HDL: 48 mg/dL — ABNORMAL LOW (ref >=50)
LDL Cholesterol (Calc): 123 mg/dL (calc) — ABNORMAL HIGH
Non-HDL Cholesterol (Calc): 148 mg/dL (calc) — ABNORMAL HIGH (ref <130)
Total CHOL/HDL Ratio: 4.1 (calc) (ref <5.0)
Triglycerides: 137 mg/dL (ref <150)

## 2019-12-26 LAB — MAGNESIUM: Magnesium: 2.2 mg/dL (ref 1.5–2.5)

## 2020-01-01 ENCOUNTER — Other Ambulatory Visit: Payer: Self-pay | Admitting: Internal Medicine

## 2020-01-01 DIAGNOSIS — J0140 Acute pansinusitis, unspecified: Secondary | ICD-10-CM

## 2020-01-01 DIAGNOSIS — J0410 Acute tracheitis without obstruction: Secondary | ICD-10-CM

## 2020-01-27 ENCOUNTER — Encounter: Payer: Commercial Managed Care - PPO | Admitting: Internal Medicine

## 2020-01-27 ENCOUNTER — Ambulatory Visit
Admission: RE | Admit: 2020-01-27 | Discharge: 2020-01-27 | Disposition: A | Discharge status: Home / Self Care | Payer: 59 | Source: Ambulatory Visit | Attending: Internal Medicine | Admitting: Internal Medicine

## 2020-01-27 ENCOUNTER — Other Ambulatory Visit: Payer: Self-pay

## 2020-01-27 DIAGNOSIS — Z1231 Encounter for screening mammogram for malignant neoplasm of breast: Secondary | ICD-10-CM

## 2020-02-11 ENCOUNTER — Other Ambulatory Visit: Payer: Self-pay | Admitting: Internal Medicine

## 2020-02-11 DIAGNOSIS — E669 Obesity, unspecified: Secondary | ICD-10-CM

## 2020-03-13 ENCOUNTER — Other Ambulatory Visit: Payer: Self-pay

## 2020-03-13 DIAGNOSIS — E669 Obesity, unspecified: Secondary | ICD-10-CM

## 2020-03-14 MED ORDER — PHENTERMINE HCL 37.5 MG PO TABS: ORAL_TABLET | ORAL | 0 refills | Status: DC

## 2020-03-17 ENCOUNTER — Other Ambulatory Visit: Payer: Self-pay | Admitting: *Deleted

## 2020-03-17 DIAGNOSIS — E669 Obesity, unspecified: Secondary | ICD-10-CM

## 2020-03-17 MED ORDER — TOPIRAMATE 50 MG PO TABS: ORAL_TABLET | ORAL | 1 refills | Status: DC

## 2020-03-18 ENCOUNTER — Other Ambulatory Visit: Payer: Self-pay | Admitting: Internal Medicine

## 2020-03-18 DIAGNOSIS — E782 Mixed hyperlipidemia: Secondary | ICD-10-CM

## 2020-03-31 IMAGING — CR DG CHEST 2V
2 series · 2 of 2 positions shown · non-contrast
Comparison: None.

CLINICAL DATA: Cough.  Chronic bronchitis.

EXAM:
CHEST - 2 VIEW

[w chest pa]
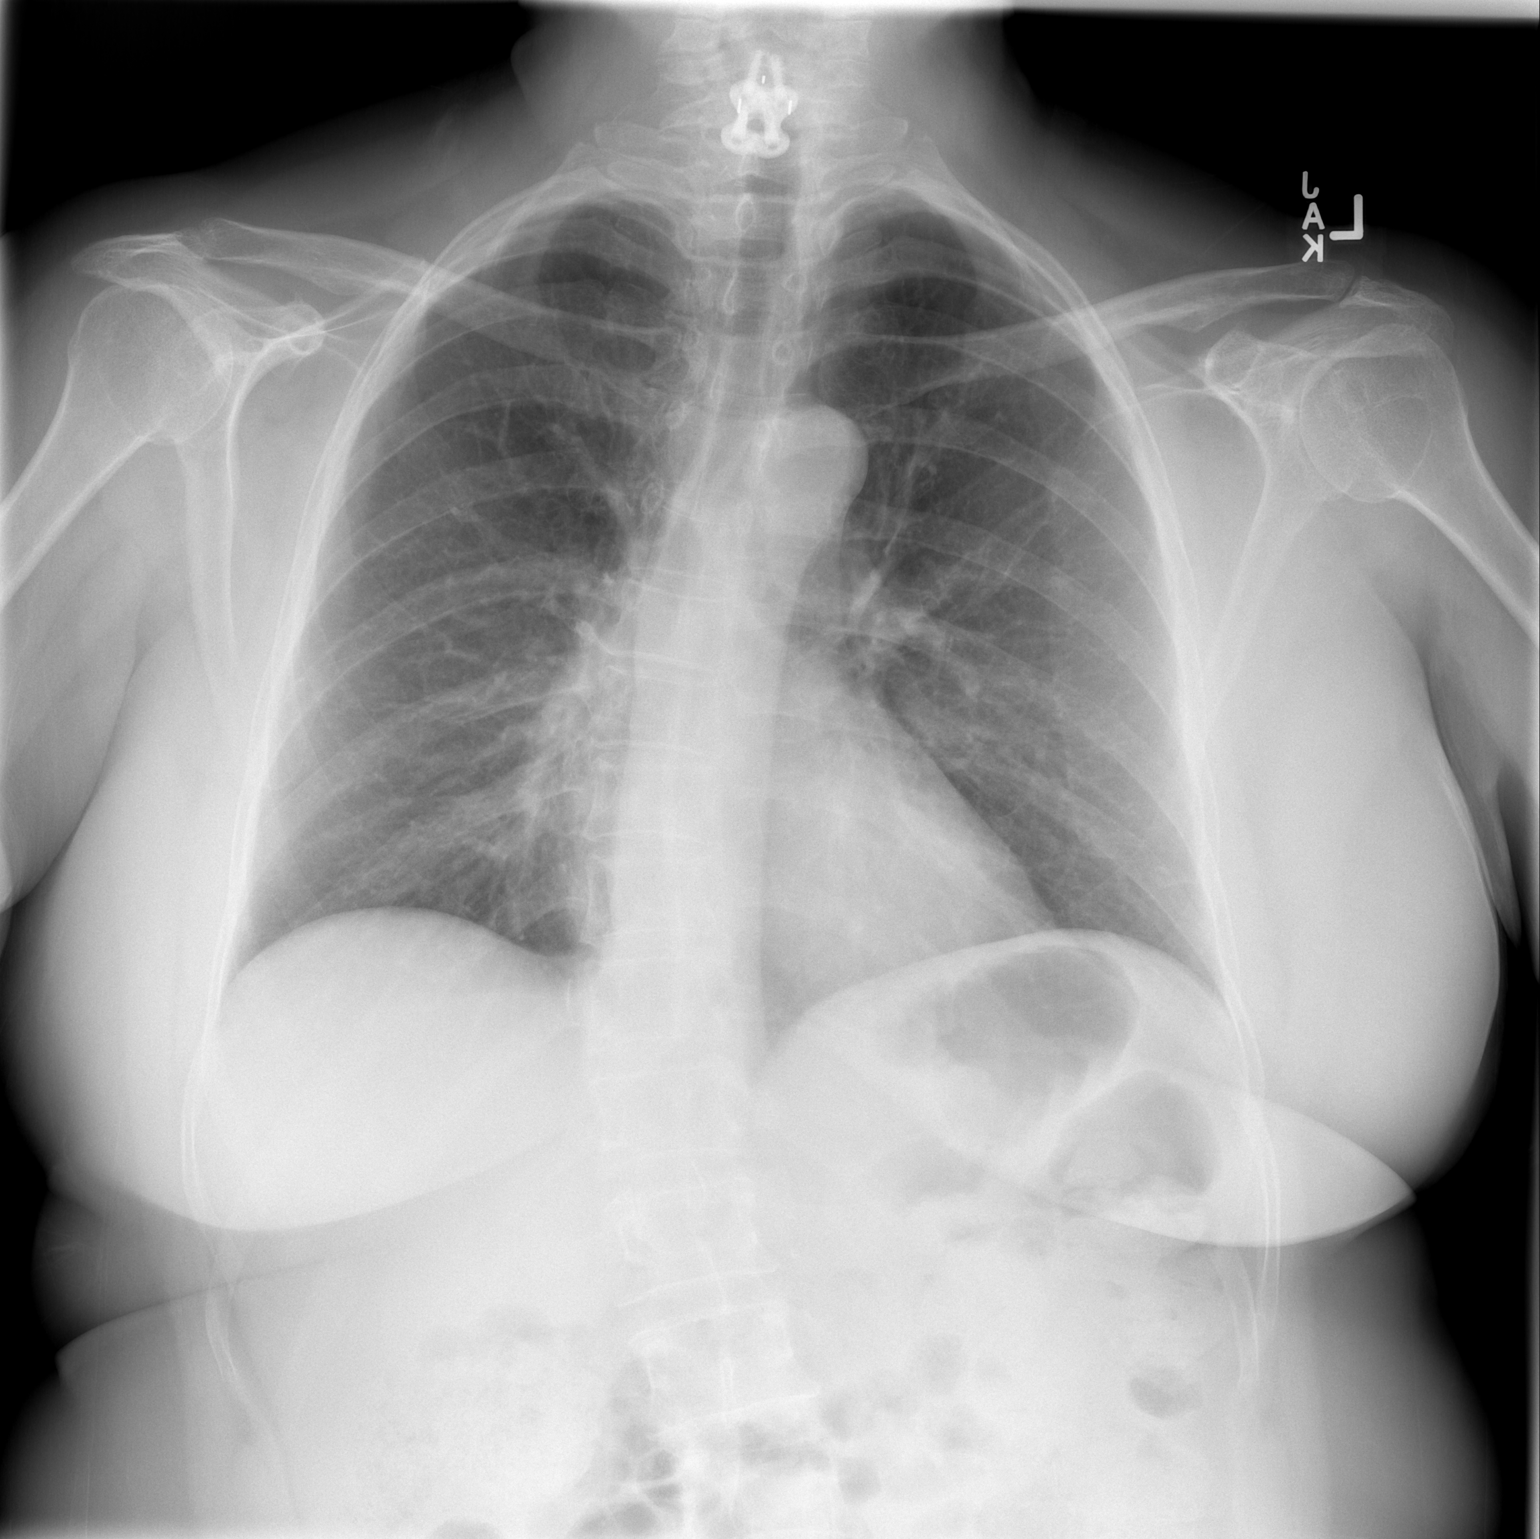

[w chest lat]
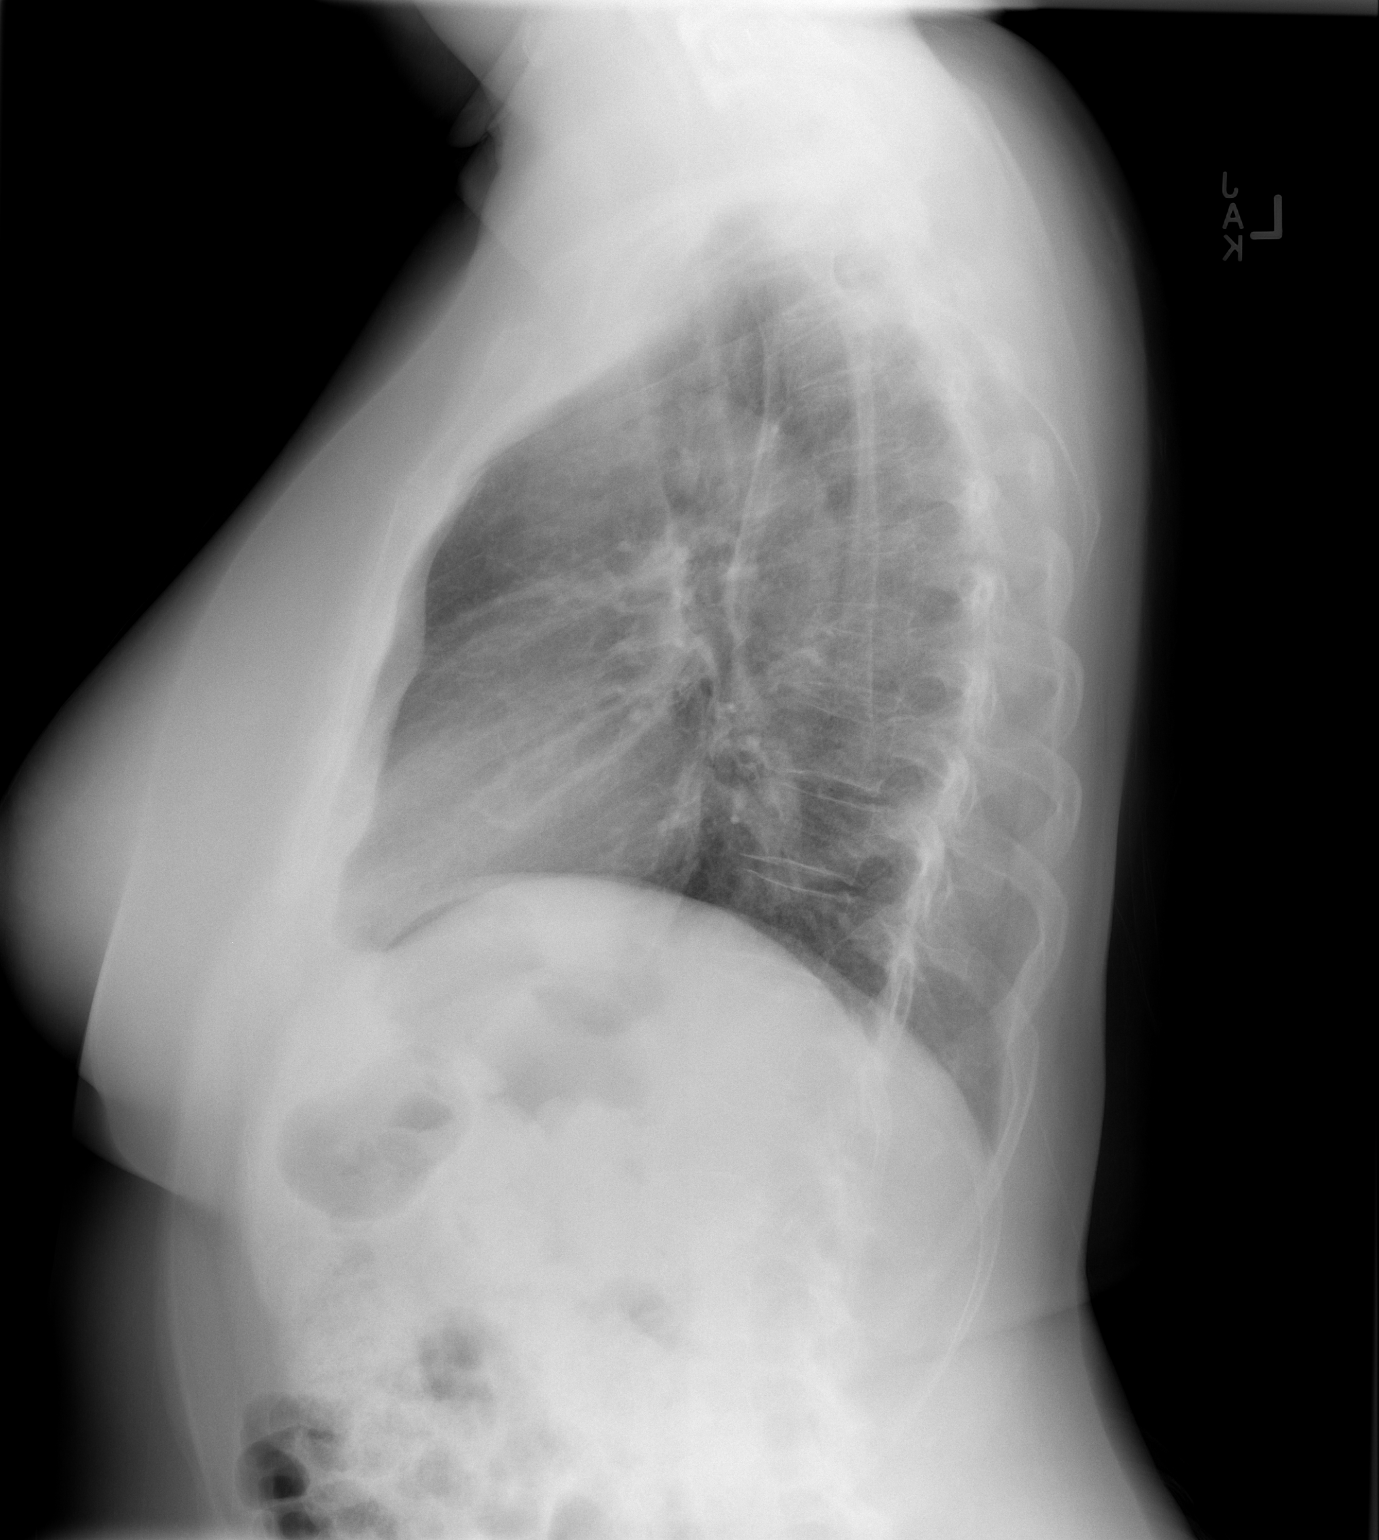

[2 of 2 positions shown; findings below may reference images not displayed]

FINDINGS: The heart size and mediastinal contours are within normal limits.
Normal pulmonary vascularity. No focal consolidation, pleural
effusion, or pneumothorax. No acute osseous abnormality. Prior lower
cervical ACDF. Mild thoracolumbar dextroscoliosis.
IMPRESSION: No active cardiopulmonary disease.

## 2020-03-31 IMAGING — CR DG SINUSES COMPLETE 3+V
3 series · 3 of 3 positions shown · non-contrast
Comparison: CT maxillofacial dated February 20, 2017.

CLINICAL DATA: History of sinus surgery.  Currently asymptomatic.

EXAM:
PARANASAL SINUSES - COMPLETE 3 + VIEW

[[person_name] *]
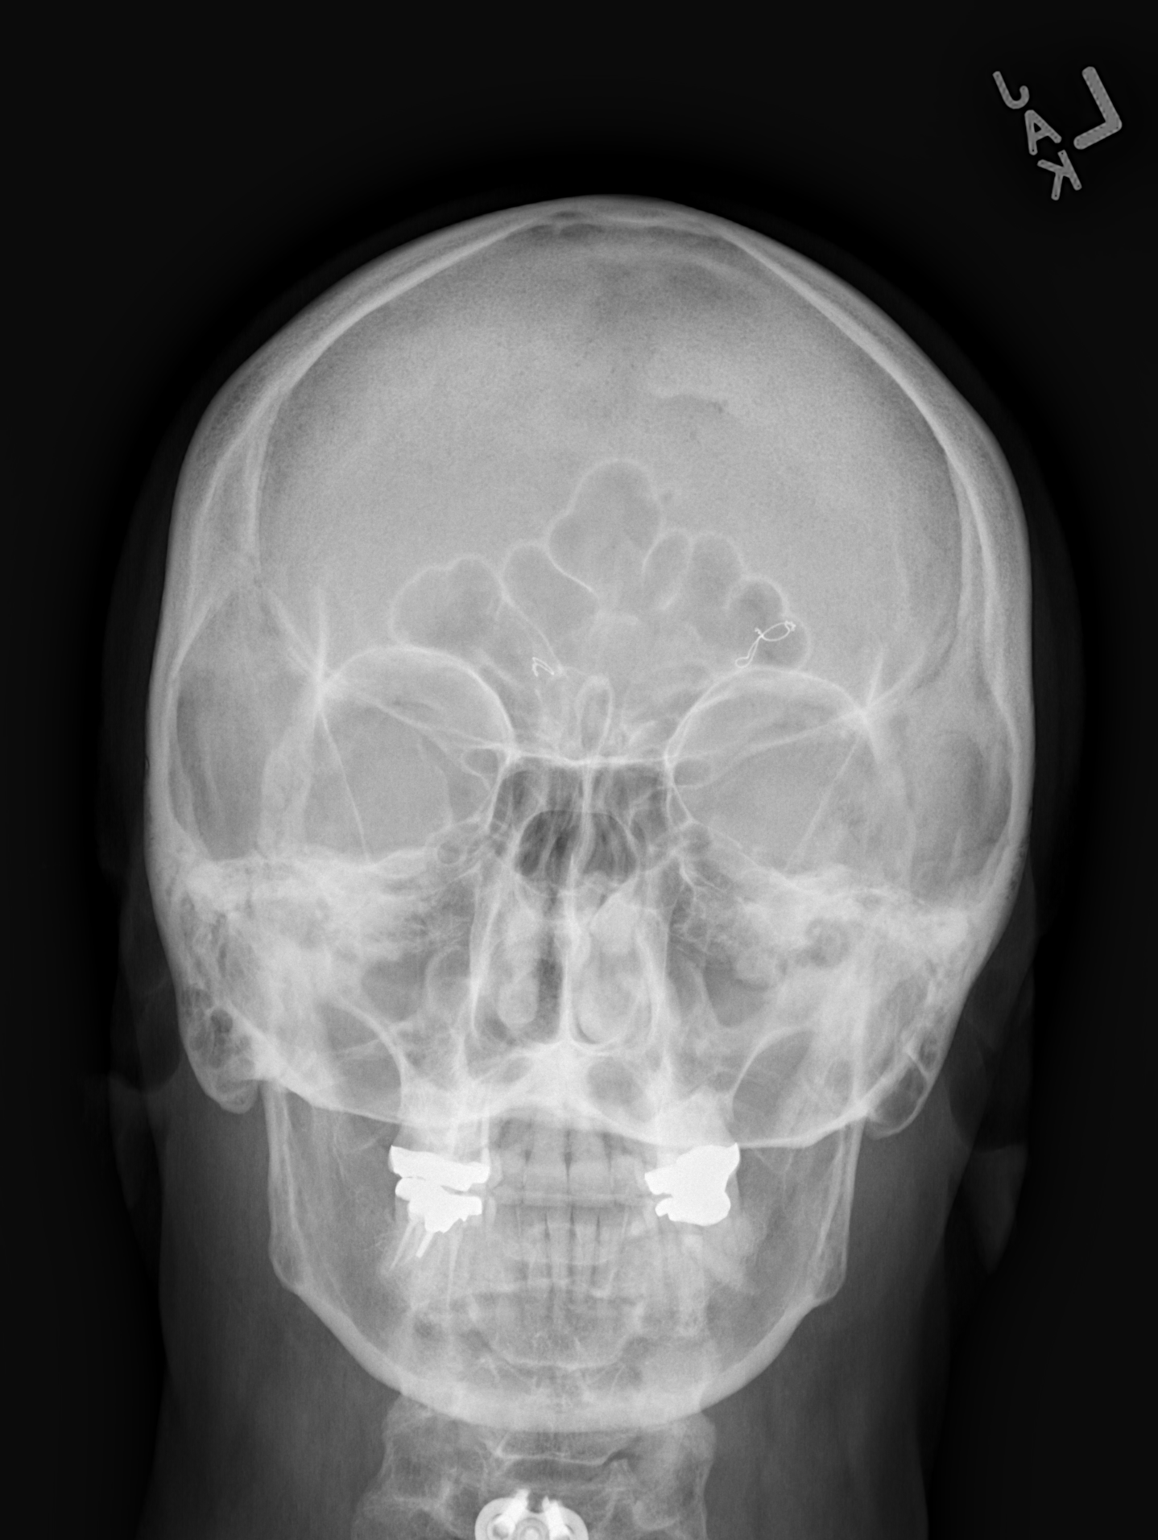

[w waters *]
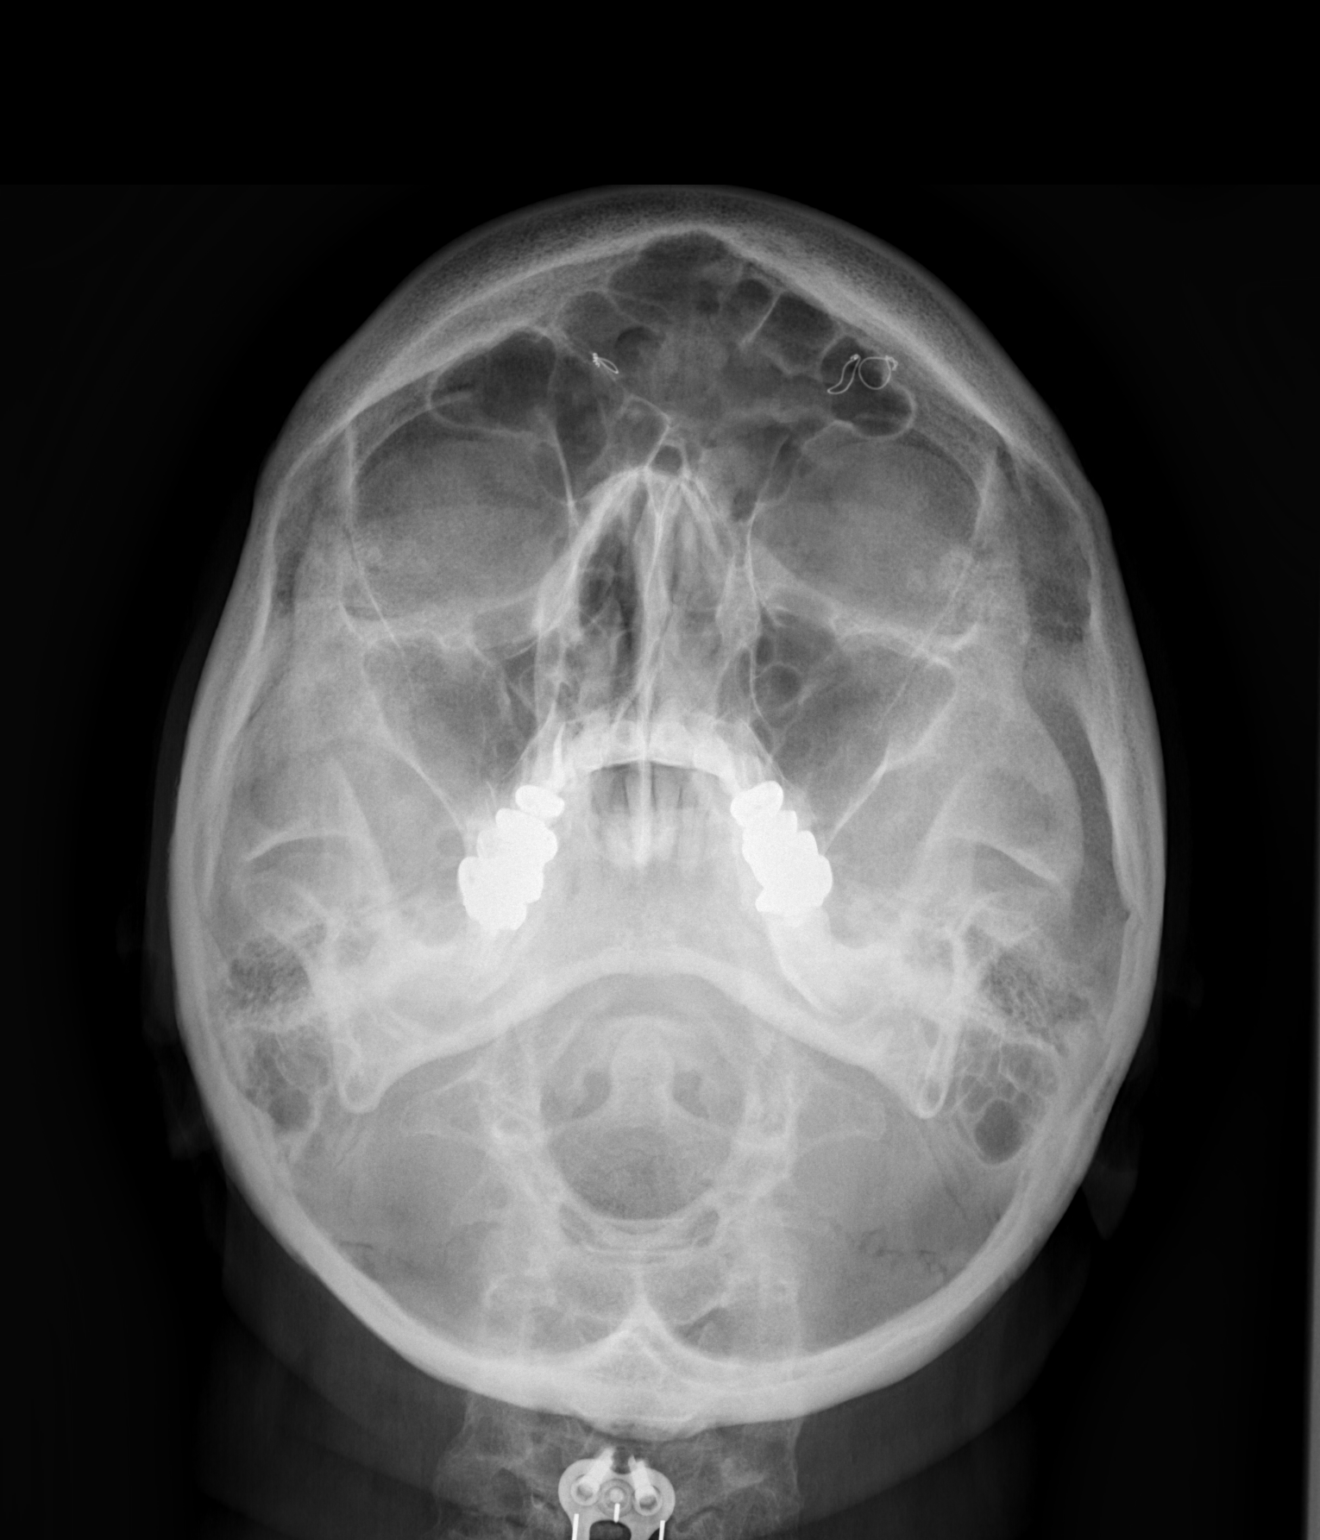

[w skull lat *]
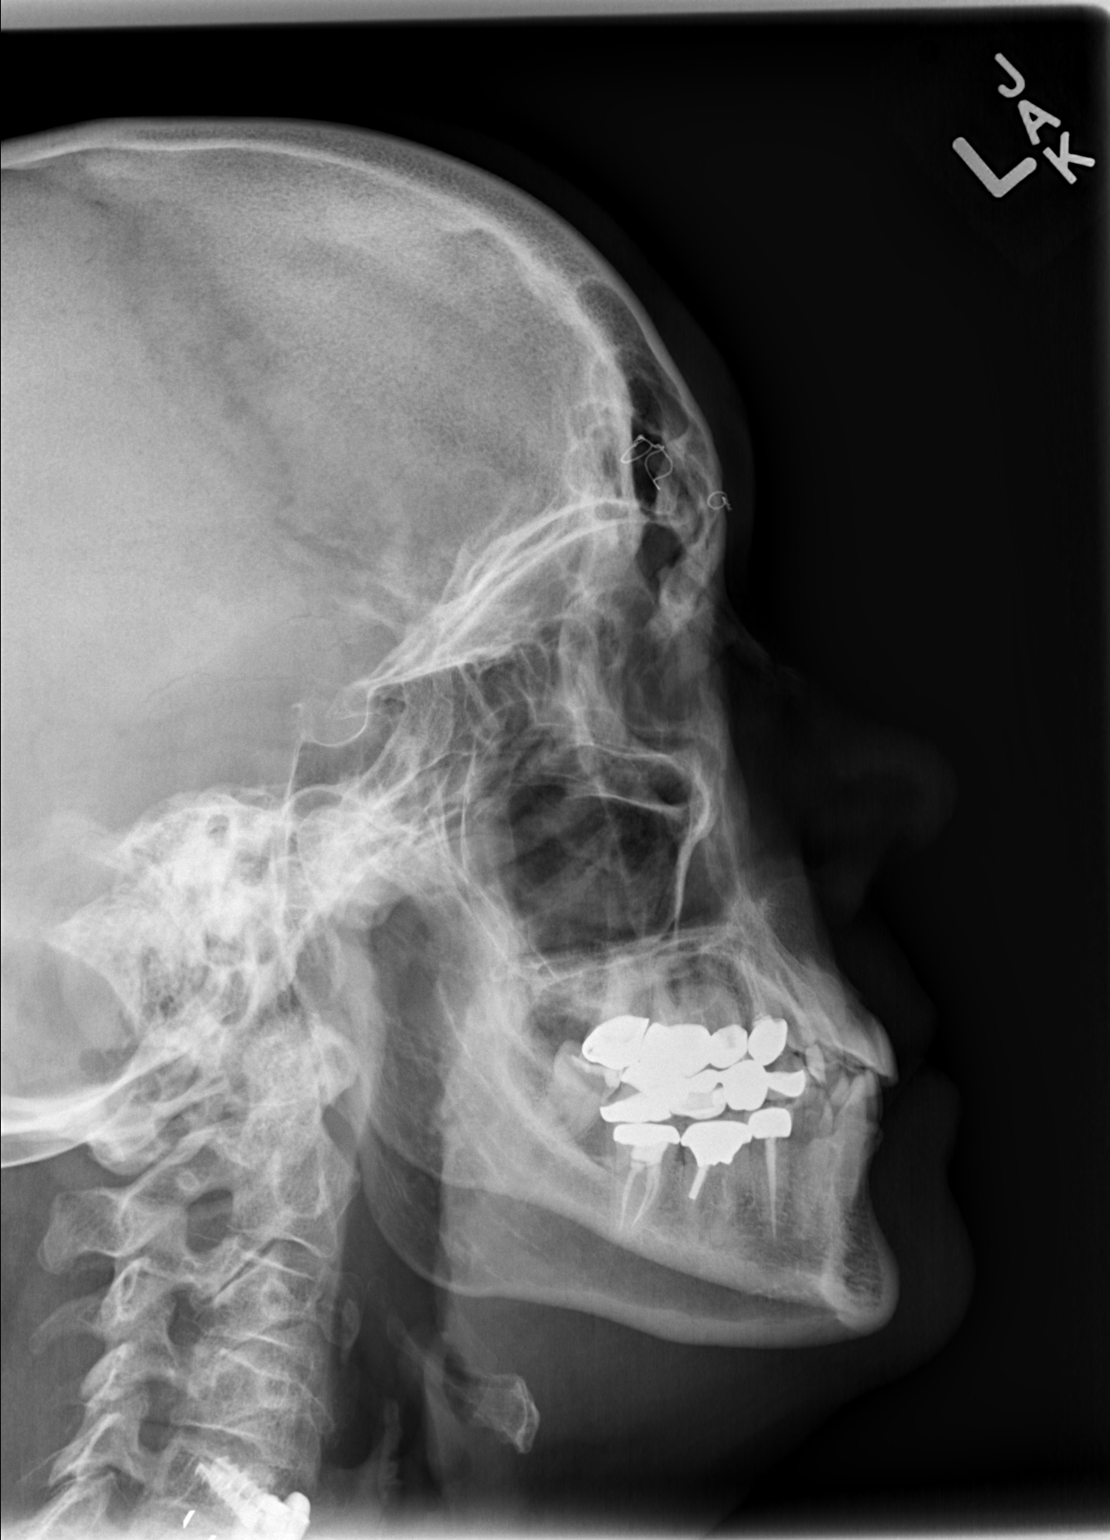

[3 of 3 positions shown; findings below may reference images not displayed]

FINDINGS: The paranasal sinus are aerated. There is no evidence of sinus
opacification air-fluid levels or mucosal thickening. No significant
bone abnormalities are seen. Postsurgical changes related to prior
frontal sinus surgery are similar to prior study.
IMPRESSION: Negative.

## 2020-04-02 ENCOUNTER — Encounter: Payer: 59 | Admitting: Adult Health Nurse Practitioner

## 2020-04-06 NOTE — Progress Notes (Signed)
Complete Physical  Assessment and Plan:  Diagnoses and all orders for this visit:  Encounter for routine adult health examination without abnormal findings Discussed shingrix; check with insurance and can get at pharmacy  Overdue PAP - declines today, schedule with next OV Continue annual mammograms  Aortic atherosclerosis ([place]) Control blood pressure, cholesterol, glucose, increase exercise.   Essential hypertension Atypically elevated today; lifestyle discussed Monitor blood pressure at home; call if consistently over 130/80 Discussed DASH diet.   Reminder to go to the ER if any CP, SOB, nausea, dizziness, severe HA, changes vision/speech, left arm numbness and tingling and jaw pain. -     CBC with Differential/Platelet -     COMPLETE METABOLIC PANEL WITH GFR -     Magnesium -     TSH -     Microalbumin / creatinine urine ratio -     Urinalysis, Routine w reflex microscopic -     EKG 12-Lead  Asthma due to environmental allergies Continue antihistamine; rare albuterol; avoid triggers  Gastroesophageal reflux disease, unspecified whether esophagitis present Well managed on current medications Discussed diet, avoiding triggers and other lifestyle changes -     COMPLETE METABOLIC PANEL WITH GFR -     Magnesium  Hyperlipidemia, mixed Continue medications for LDL goal <100; typically at goal with atorvastatin 20 mg daily Continue low cholesterol diet and exercise.  Check lipid panel.  -     Lipid panel -     TSH  Abnormal glucose Discussed disease and risks Discussed diet/exercise, weight management  -     Hemoglobin A1c  Vitamin D deficiency -     VITAMIN D 25 Hydroxy (Vit-D Deficiency, Fractures)  Major depression in remission (HCC) Continue medications  Lifestyle discussed: diet/exerise, sleep hygiene, stress management, hydration  Overweight (BMI 25.0-29.9) Long discussion about weight loss, diet, and exercise Recommended diet heavy in fruits and veggies and  low in animal meats, cheeses, and dairy products, appropriate calorie intake Discussed appropriate weight for height  Follow up at next visit  Screening for thyroid disorder -     TSH  Screening for hematuria or proteinuria -     Microalbumin / creatinine urine ratio -     Urinalysis, Routine w reflex microscopic  Screening for cardiovascular condition -     EKG 12-Lead  Need for hepatitis C screening test -     Hepatitis C antibody  Screening cervical cancer PAP overdue, never abnormal; declines today; will schedule with next OV  Orders Placed This Encounter  Procedures  . CBC with Differential/Platelet  . COMPLETE METABOLIC PANEL WITH GFR  . Magnesium  . Lipid panel  . TSH  . Hemoglobin A1c  . VITAMIN D 25 Hydroxy (Vit-D Deficiency, Fractures)  . Microalbumin / creatinine urine ratio  . Urinalysis, Routine w reflex microscopic  . Hepatitis C antibody  . EKG 12-Lead    Discussed med's effects and SE's. Screening labs and tests as requested with regular follow-up as recommended. Over 40 minutes of exam, counseling, chart review, and complex, high level critical decision making was performed this visit.   Future Appointments  Date Time Provider [place]  04/08/2021  3:00 PM [name], NP [facility] None     HPI  55 y.o. female  presents for a complete physical and follow up for has Essential hypertension; Hyperlipidemia, mixed; Abnormal glucose; Vitamin D deficiency; Gastroesophageal reflux disease; Asthma due to environmental allergies; Major depression in remission ([place]); Overweight (BMI 25.0-29.9); and Aortic atherosclerosis (HCC) on  their problem list..  Married 38 years, no children, has a dog, border collie mix.  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 Going well, enjoys her job a lot. No concerns today.   She has hx of depression with anxious features and is currently on celexa 40 mg daily, wellbutrin XR  300 mg daily, and she is on buspar 15mg  once every 2-3 days, reports symptoms are well controlled on current regimen. Sleeping well.   she has a diagnosis of GERD which improved on nexium 40 mg, was able to taper off without recurrent symptoms.   Mild intermittent asthma due to allergies, on allegra and PRN albuterol.   BMI is Body mass index is 30.07 kg/m., she has been working on diet, admits minimal exercise due to weather. She has done well with phentermine/topamax with weight loss, lost 0 lb since last visit, due for a break. Denies SE.  Water: 8+ glasses, no soda/tea. Does some crystal light sugar free. No significant caffeine. Tries to stick to lean proteins.  Wt Readings from Last 3 Encounters:  04/08/20 164 lb 6.4 oz (74.6 kg)  12/25/19 161 lb (73 kg)  09/23/19 163 lb 3.2 oz (74 kg)   Her blood pressure has been controlled at home, today their BP is BP: 140/90 She does workout. She denies chest pain, shortness of breath, dizziness.   She has aortic atherosclerosis per CT 09/2018  She is on cholesterol medication and denies myalgias. Her cholesterol is not at goal last visit, typically at goal. The cholesterol last visit was:   Lab Results  Component Value Date   CHOL 196 12/25/2019   HDL 48 (L) 12/25/2019   LDLCALC 123 (H) 12/25/2019   TRIG 137 12/25/2019   CHOLHDL 4.1 12/25/2019   She has been working on diet and exercise for glucose, denies nausea, paresthesia of the feet, polydipsia, polyuria, visual disturbances and vomiting. Last A1C in the office was:  Lab Results  Component Value Date   HGBA1C 5.2 09/23/2019   Last GFR: Lab Results  Component Value Date   GFRNONAA 97 12/25/2019   Patient is on Vitamin D supplement, taking 50000 IU weekly for many years Lab Results  Component Value Date   VD25OH 90 09/23/2019        Current Medications:  Current Outpatient Medications on File Prior to Visit  Medication Sig Dispense Refill  . aspirin 81 MG chewable tablet  Chew by mouth daily.    Marland Kitchen atorvastatin (LIPITOR) 20 MG tablet TAKE 1 TABLET BY MOUTH DAILY FOR CHOLESTEROL 90 tablet 1  . buPROPion (WELLBUTRIN XL) 300 MG 24 hr tablet Take 1 tablet every morning for Mood and Focus and Concentration 90 tablet 0  . busPIRone (BUSPAR) 15 MG tablet Take 1 tablet once daily as needed for breakthrough anxiety. 90 tablet 1  . calcium citrate (CALCITRATE - DOSED IN MG ELEMENTAL CALCIUM) 950 MG tablet Take 200 mg of elemental calcium by mouth daily.    . citalopram (CELEXA) 40 MG tablet Take 1 tablet Daily for Anxiety & Mood 90 tablet 3  . cyclobenzaprine (FLEXERIL) 10 MG tablet Take     1/2 to 1 tablet      2 to 3  x /day       as needed for  Muscle Spasm 90 tablet 0  . Fexofenadine HCl (ALLEGRA PO) Take by mouth.    Marland Kitchen ibuprofen (ADVIL,MOTRIN) 600 MG tablet TAKE 1 TABLET BY MOUTH EVERY 6  HOURS AS NEEDED 90 tablet 3  . Multiple Vitamins-Minerals (MULTIVITAMIN ADULTS PO) Take by mouth daily.    . phentermine (ADIPEX-P) 37.5 MG tablet TAKE 1/2 TO 1 TABLET BY MOUTH EVERY MORNING FOR DIETING AND WEIGHT LOSS 90 tablet 0  . PROAIR HFA 108 (90 Base) MCG/ACT inhaler INHALE 1 PUFF PO Q 4 H (Patient taking differently: INHALE 1 PUFF PO Q 4 H PRN) 60 g 1  . promethazine-dextromethorphan (PROMETHAZINE-DM) 6.25-15 MG/5ML syrup Take    1 to 2 teaspoonful         every 4 hours         if needed for cough 473 mL 0  . topiramate (TOPAMAX) 50 MG tablet Take 1/2 to 1 tablet 2 x /day at Suppertime & Bedtime for Dieting & Weight Loss 180 tablet 1  . vitamin B-12 (CYANOCOBALAMIN) 100 MCG tablet Take 100 mcg by mouth daily.     . Vitamin D, Ergocalciferol, (DRISDOL) 1.25 MG (50000 UNIT) CAPS capsule Take     1 capsule     every 7 days       for severe Vit D Deficiency 12 capsule 3   No current facility-administered medications on file prior to visit.   Allergies:  No Known Allergies Medical History:  She has Essential hypertension; Hyperlipidemia, mixed; Abnormal glucose; Vitamin D  deficiency; Gastroesophageal reflux disease; Asthma due to environmental allergies; Major depression in remission ([place]); Overweight (BMI 25.0-29.9); and Aortic atherosclerosis ([place]) on their problem list. Health Maintenance:   Immunization History  Administered Date(s) Administered  . Influenza Inj Mdck Quad With Preservative 11/08/2016, 01/03/2019  . Influenza,inj,Quad PF,6+ Mos 10/27/2017  . Influenza-Unspecified 12/13/2019  . PFIZER(Purple Top)SARS-COV-2 Vaccination 05/03/2019, 05/27/2019, 12/13/2019  . PPD Test 11/08/2016, 12/14/2017, 01/03/2019  . Pneumococcal-Unspecified 02/22/2015  . Td 02/22/2012  . Zoster 02/22/2015    Tetanus: 2014 Pneumovax: 2017 Flu vaccine: 11/2019 Zoster: 2017, ask insurance about shingrix Covid 19: 3/3, 2021, pfizer  LMP: No LMP recorded. Patient is postmenopausal. Pap: unsure last, ? 2017 was seeing GYN in [facility], never abnormal PAP, overdue -  MGM: 01/27/2020 DEXA: -   Colonoscopy: 2016, normal, 10 year follow up EGD: has had remotely, not recommended routine follow up  Last Dental Exam: Dr. Marcello Moores, last 01/2020, goes q15m Last Eye Exam: Dr. Satira Sark, last 2021, glasses  Patient Care Team: Unk Pinto, MD as PCP - General (Internal Medicine)  Surgical History:  She has a past surgical history that includes Colonoscopy (N/A, 2015); Fracture surgery (1988); Nasal septum surgery (1991); l ulnar nonunion (1992); r sub talar fusion (2008); nonunion 5th metatarsal (2008); redo subtalar fusion (2013); Tonsillectomy and adenoidectomy (1972); rt ankle surg for non union (2013); and Spine surgery (2011). Family History:  Herfamily history includes Alcohol abuse in her father; CAD in her father; Cirrhosis in her father and sister; Diabetes in her mother; Heart disease in her brother; Heart failure in her brother; Hypertension in her brother; Leukemia in her mother; Stroke in her maternal grandmother. Social History:  She reports that she has never  smoked. She has never used smokeless tobacco. She reports that she does not drink alcohol and does not use drugs.  Review of Systems: Review of Systems  Constitutional: Negative for malaise/fatigue and weight loss.  HENT: Negative for hearing loss and tinnitus.   Eyes: Negative for blurred vision and double vision.       Chronic yellow honey comb vision, has discussed with ophth, neg workup, no changes  Respiratory: Negative for cough,  shortness of breath and wheezing.   Cardiovascular: Negative for chest pain, palpitations, orthopnea, claudication and leg swelling.  Gastrointestinal: Negative for abdominal pain, blood in stool, constipation, diarrhea, heartburn, melena, nausea and vomiting.  Genitourinary: Negative.   Musculoskeletal: Negative for joint pain and myalgias.  Skin: Negative for rash.  Neurological: Negative for dizziness, tingling, sensory change, weakness and headaches.  Endo/Heme/Allergies: Negative for polydipsia.  Psychiatric/Behavioral: Negative.   All other systems reviewed and are negative.   Physical Exam: Estimated body mass index is 30.07 kg/m as calculated from the following:   Height as of this encounter: 5\' 2"  (1.575 m).   Weight as of this encounter: 164 lb 6.4 oz (74.6 kg). BP 140/90   Pulse 91   Temp (!) 97.3 F (36.3 C)   Ht 5\' 2"  (1.575 m)   Wt 164 lb 6.4 oz (74.6 kg)   SpO2 96%   BMI 30.07 kg/m  General Appearance: Well nourished, in no apparent distress.  Eyes: PERRLA, EOMs, conjunctiva no swelling or erythema, normal fundi and vessels.  Sinuses: No Frontal/maxillary tenderness  ENT/Mouth: Ext aud canals clear, normal light reflex with TMs without erythema, bulging. Good dentition. No erythema, swelling, or exudate on post pharynx. Tonsils not swollen or erythematous. Hearing normal.  Neck: Supple, thyroid normal. No bruits  Respiratory: Respiratory effort normal, BS equal bilaterally without rales, rhonchi, wheezing or stridor.  Cardio: RRR  without murmurs, rubs or gallops. Brisk peripheral pulses without edema.  Chest: symmetric, with normal excursions and percussion.  Breasts: just had mammogram, no concerns, declines Abdomen: Soft, nontender, no guarding, rebound, hernias, masses, or organomegaly.  Lymphatics: Non tender without lymphadenopathy.  Genitourinary: DUE - declines today, get at next OV Musculoskeletal: Full ROM all peripheral extremities,5/5 strength, and normal gait.  Skin: Warm, dry without rashes, lesions, ecchymosis. Neuro: Cranial nerves intact, reflexes equal bilaterally. Normal muscle tone, no cerebellar symptoms. Sensation intact.  Psych: Awake and oriented X 3, normal affect, Insight and Judgment appropriate.   EKG: WNL no ST changes.   Erin Nichols 4:05 PM [facility]

## 2020-04-08 ENCOUNTER — Other Ambulatory Visit: Payer: Self-pay

## 2020-04-08 ENCOUNTER — Encounter: Payer: Self-pay | Admitting: Adult Health

## 2020-04-08 ENCOUNTER — Ambulatory Visit: Payer: 59 | Admitting: Adult Health

## 2020-04-08 VITALS — BP 140/90 | HR 91 | Temp 97.3°F | Ht 62.0 in | Wt 164.4 lb

## 2020-04-08 DIAGNOSIS — E663 Overweight: Secondary | ICD-10-CM

## 2020-04-08 DIAGNOSIS — I10 Essential (primary) hypertension: Secondary | ICD-10-CM

## 2020-04-08 DIAGNOSIS — E559 Vitamin D deficiency, unspecified: Secondary | ICD-10-CM

## 2020-04-08 DIAGNOSIS — Z8249 Family history of ischemic heart disease and other diseases of the circulatory system: Secondary | ICD-10-CM | POA: Diagnosis not present

## 2020-04-08 DIAGNOSIS — K219 Gastro-esophageal reflux disease without esophagitis: Secondary | ICD-10-CM

## 2020-04-08 DIAGNOSIS — I700 Atherosclerosis of aorta: Secondary | ICD-10-CM

## 2020-04-08 DIAGNOSIS — Z136 Encounter for screening for cardiovascular disorders: Secondary | ICD-10-CM | POA: Diagnosis not present

## 2020-04-08 DIAGNOSIS — Z131 Encounter for screening for diabetes mellitus: Secondary | ICD-10-CM

## 2020-04-08 DIAGNOSIS — J45909 Unspecified asthma, uncomplicated: Secondary | ICD-10-CM

## 2020-04-08 DIAGNOSIS — Z0000 Encounter for general adult medical examination without abnormal findings: Secondary | ICD-10-CM | POA: Diagnosis not present

## 2020-04-08 DIAGNOSIS — Z1329 Encounter for screening for other suspected endocrine disorder: Secondary | ICD-10-CM

## 2020-04-08 DIAGNOSIS — R7309 Other abnormal glucose: Secondary | ICD-10-CM

## 2020-04-08 DIAGNOSIS — F325 Major depressive disorder, single episode, in full remission: Secondary | ICD-10-CM

## 2020-04-08 DIAGNOSIS — Z1159 Encounter for screening for other viral diseases: Secondary | ICD-10-CM

## 2020-04-08 DIAGNOSIS — E782 Mixed hyperlipidemia: Secondary | ICD-10-CM

## 2020-04-08 DIAGNOSIS — Z1389 Encounter for screening for other disorder: Secondary | ICD-10-CM

## 2020-04-08 NOTE — Patient Instructions (Addendum)
Erin Nichols , Thank you for taking time to come for your Annual Wellness Visit. I appreciate your ongoing commitment to your health goals. Please review the following plan we discussed and let me know if I can assist you in the future.   These are the goals we discussed: Goals    . Blood Pressure < 130/80    . DIET - INCREASE WATER INTAKE     65-80+ fluid ounces     . Exercise 150 min/wk Moderate Activity    . Weight (lb) < 150 lb (68 kg)       This is a list of the screening recommended for you and due dates:  Health Maintenance  Topic Date Due  .  Hepatitis C: One time screening is recommended by Center for Disease Control  (CDC) for  adults born from [birth year] through 1967.   Never done  . Pap Smear  Never done  . Mammogram  01/26/2021  . Tetanus Vaccine  02/21/2022  . Colon Cancer Screening  01/01/2025  . Flu Shot  Completed  . COVID-19 Vaccine  Completed  . HIV Screening  Discontinued     Ask insurance if they cover shingrix  - if they do, can get at a [facility] or [facility]     Know what a healthy weight is for you (roughly BMI <25) and aim to maintain this  Aim for 7+ servings of fruits and vegetables daily  65-80+ fluid ounces of water or unsweet tea for healthy kidneys  Limit to max 1 drink of alcohol per day; avoid smoking/tobacco  Limit animal fats in diet for cholesterol and heart health - choose grass fed whenever available  Avoid highly processed foods, and foods high in saturated/trans fats  Aim for low stress - take time to unwind and care for your mental health  Aim for 150 min of moderate intensity exercise weekly for heart health, and weights twice weekly for bone health  Aim for 7-9 hours of sleep daily     HYPERTENSION INFORMATION  Monitor your blood pressure at home, please keep a record and bring that in with you to your next office visit.   Go to the ER if any CP, SOB, nausea, dizziness, severe HA, changes  vision/speech  Testing/Procedures: HOW TO TAKE YOUR BLOOD PRESSURE:  Rest 5 minutes before taking your blood pressure.  Don't smoke or drink caffeinated beverages for at least 30 minutes before.  Take your blood pressure before (not after) you eat.  Sit comfortably with your back supported and both feet on the floor (don't cross your legs).  Elevate your arm to heart level on a table or a desk.  Use the proper sized cuff. It should fit smoothly and snugly around your bare upper arm. There should be enough room to slip a fingertip under the cuff. The bottom edge of the cuff should be 1 inch above the crease of the elbow.  Your most recent BP: BP: 140/90   Take your medications faithfully as instructed. Maintain a healthy weight. Get at least 150 minutes of aerobic exercise per week. Minimize salt intake  -if using any table/sea salt, recommend switching to "no salt" supplement (potassium based)  Increase potassium and magnesium in diet -  Minimize alcohol intake  DASH Eating Plan DASH stands for "Dietary Approaches to Stop Hypertension." The DASH eating plan is a healthy eating plan that has been shown to reduce high blood pressure (hypertension). Additional health benefits may include reducing the risk  of type 2 diabetes mellitus, heart disease, and stroke. The DASH eating plan may also help with weight loss. WHAT DO I NEED TO KNOW ABOUT THE DASH EATING PLAN? For the DASH eating plan, you will follow these general guidelines:  Choose foods with a percent daily value for sodium of less than 5% (as listed on the food label).  Use salt-free seasonings or herbs instead of table salt or sea salt.  Check with your health care provider or pharmacist before using salt substitutes.  Eat lower-sodium products, often labeled as "lower sodium" or "no salt added."  Eat fresh foods.  Eat more vegetables, fruits, and low-fat dairy products.  Choose whole grains. Look for the word "whole"  as the first word in the ingredient list.  Choose fish and skinless chicken or [place] more often than red meat. Limit fish, poultry, and meat to 6 oz (170 g) each day.  Limit sweets, desserts, sugars, and sugary drinks.  Choose heart-healthy fats.  Limit cheese to 1 oz (28 g) per day.  Eat more home-cooked food and less restaurant, buffet, and fast food.  Limit fried foods.  Cook foods using methods other than frying.  Limit canned vegetables. If you do use them, rinse them well to decrease the sodium.  When eating at a restaurant, ask that your food be prepared with less salt, or no salt if possible. WHAT FOODS CAN I EAT? Seek help from a dietitian for individual calorie needs. Grains Whole grain or whole wheat bread. Brown rice. Whole grain or whole wheat pasta. Quinoa, bulgur, and whole grain cereals. Low-sodium cereals. Corn or whole wheat flour tortillas. Whole grain cornbread. Whole grain crackers. Low-sodium crackers. Vegetables Fresh or frozen vegetables (raw, steamed, roasted, or grilled). Low-sodium or reduced-sodium tomato and vegetable juices. Low-sodium or reduced-sodium tomato sauce and paste. Low-sodium or reduced-sodium canned vegetables.  Fruits All fresh, canned (in natural juice), or frozen fruits. Meat and Other Protein Products Ground beef (85% or leaner), grass-fed beef, or beef trimmed of fat. Skinless chicken or [place]. Ground chicken or [place]. Pork trimmed of fat. All fish and seafood. Eggs. Dried beans, peas, or lentils. Unsalted nuts and seeds. Unsalted canned beans. Dairy Low-fat dairy products, such as skim or 1% milk, 2% or reduced-fat cheeses, low-fat ricotta or cottage cheese, or plain low-fat yogurt. Low-sodium or reduced-sodium cheeses. Fats and Oils Tub margarines without trans fats. Light or reduced-fat mayonnaise and salad dressings (reduced sodium). Avocado. Safflower, olive, or canola oils. Natural peanut or almond butter. Other Unsalted  popcorn and pretzels. The items listed above may not be a complete list of recommended foods or beverages. Contact your dietitian for more options. WHAT FOODS ARE NOT RECOMMENDED? Grains White bread. White pasta. White rice. Refined cornbread. Bagels and croissants. Crackers that contain trans fat. Vegetables Creamed or fried vegetables. Vegetables in a cheese sauce. Regular canned vegetables. Regular canned tomato sauce and paste. Regular tomato and vegetable juices. Fruits Dried fruits. Canned fruit in light or heavy syrup. Fruit juice. Meat and Other Protein Products Fatty cuts of meat. Ribs, chicken wings, bacon, sausage, bologna, salami, chitterlings, fatback, hot dogs, bratwurst, and packaged luncheon meats. Salted nuts and seeds. Canned beans with salt. Dairy Whole or 2% milk, cream, half-and-half, and cream cheese. Whole-fat or sweetened yogurt. Full-fat cheeses or blue cheese. Nondairy creamers and whipped toppings. Processed cheese, cheese spreads, or cheese curds. Condiments Onion and garlic salt, seasoned salt, table salt, and sea salt. Canned and packaged gravies. Worcestershire sauce. Tartar sauce. Barbecue  sauce. Teriyaki sauce. Soy sauce, including reduced sodium. Steak sauce. Fish sauce. Oyster sauce. Cocktail sauce. Horseradish. Ketchup and mustard. Meat flavorings and tenderizers. Bouillon cubes. Hot sauce. Tabasco sauce. Marinades. Taco seasonings. Relishes. Fats and Oils Butter, stick margarine, lard, shortening, ghee, and bacon fat. Coconut, palm kernel, or palm oils. Regular salad dressings. Other Pickles and olives. Salted popcorn and pretzels. The items listed above may not be a complete list of foods and beverages to avoid. Contact your dietitian for more information. WHERE CAN I FIND MORE INFORMATION? National Heart, Lung, and Blood Institute: travelstabloid.com Document Released: 01/27/2011 Document Revised: 06/24/2013 Document  Reviewed: 12/12/2012 [facility] Patient Information 2015 [place], [place]. This information is not intended to replace advice given to you by your health care provider. Make sure you discuss any questions you have with your health care provider.

## 2020-04-09 ENCOUNTER — Encounter: Payer: Self-pay | Admitting: Adult Health

## 2020-04-09 DIAGNOSIS — N182 Chronic kidney disease, stage 2 (mild): Secondary | ICD-10-CM | POA: Insufficient documentation

## 2020-04-09 LAB — COMPLETE METABOLIC PANEL WITH GFR
AG Ratio: 1.7 (calc) (ref 1.0–2.5)
ALT: 12 U/L (ref 6–29)
AST: 19 U/L (ref 10–35)
Albumin: 4.7 g/dL (ref 3.6–5.1)
Alkaline phosphatase (APISO): 165 U/L — ABNORMAL HIGH (ref 37–153)
BUN: 13 mg/dL (ref 7–25)
CO2: 27 mmol/L (ref 20–32)
Calcium: 9.4 mg/dL (ref 8.6–10.4)
Chloride: 103 mmol/L (ref 98–110)
Creat: 0.83 mg/dL (ref 0.50–1.05)
GFR, Est African American: 91 mL/min/{1.73_m2} (ref >=60)
GFR, Est Non African American: 79 mL/min/{1.73_m2} (ref >=60)
Globulin: 2.7 g/dL (calc) (ref 1.9–3.7)
Glucose, Bld: 81 mg/dL (ref 65–99)
Potassium: 4.1 mmol/L (ref 3.5–5.3)
Sodium: 140 mmol/L (ref 135–146)
Total Bilirubin: 0.4 mg/dL (ref 0.2–1.2)
Total Protein: 7.4 g/dL (ref 6.1–8.1)

## 2020-04-09 LAB — CBC WITH DIFFERENTIAL/PLATELET
Absolute Monocytes: 378 cells/uL (ref 200–950)
Basophils Absolute: 59 cells/uL (ref 0–200)
Basophils Relative: 1 %
Eosinophils Absolute: 77 cells/uL (ref 15–500)
Eosinophils Relative: 1.3 %
HCT: 41.5 % (ref 35.0–45.0)
Hemoglobin: 14.1 g/dL (ref 11.7–15.5)
Lymphs Abs: 1611 cells/uL (ref 850–3900)
MCH: 30.9 pg (ref 27.0–33.0)
MCHC: 34 g/dL (ref 32.0–36.0)
MCV: 90.8 fL (ref 80.0–100.0)
MPV: 11.5 fL (ref 7.5–12.5)
Monocytes Relative: 6.4 %
Neutro Abs: 3776 cells/uL (ref 1500–7800)
Neutrophils Relative %: 64 %
Platelets: 267 10*3/uL (ref 140–400)
RBC: 4.57 10*6/uL (ref 3.80–5.10)
RDW: 12.8 % (ref 11.0–15.0)
Total Lymphocyte: 27.3 %
WBC: 5.9 10*3/uL (ref 3.8–10.8)

## 2020-04-09 LAB — URINALYSIS, ROUTINE W REFLEX MICROSCOPIC
Bilirubin Urine: NEGATIVE
Glucose, UA: NEGATIVE
Hgb urine dipstick: NEGATIVE
Ketones, ur: NEGATIVE
Leukocytes,Ua: NEGATIVE
Nitrite: NEGATIVE
Protein, ur: NEGATIVE
Specific Gravity, Urine: 1.006 (ref 1.001–1.03)
pH: 5.5 (ref 5.0–8.0)

## 2020-04-09 LAB — HEPATITIS C ANTIBODY
Hepatitis C Ab: NONREACTIVE
SIGNAL TO CUT-OFF: 0.01 (ref <1.00)

## 2020-04-09 LAB — MICROALBUMIN / CREATININE URINE RATIO
Creatinine, Urine: 32 mg/dL (ref 20–275)
Microalb, Ur: <0.2 mg/dL

## 2020-04-09 LAB — LIPID PANEL
Cholesterol: 199 mg/dL (ref <200)
HDL: 60 mg/dL (ref >=50)
LDL Cholesterol (Calc): 119 mg/dL (calc) — ABNORMAL HIGH
Non-HDL Cholesterol (Calc): 139 mg/dL (calc) — ABNORMAL HIGH (ref <130)
Total CHOL/HDL Ratio: 3.3 (calc) (ref <5.0)
Triglycerides: 94 mg/dL (ref <150)

## 2020-04-09 LAB — HEMOGLOBIN A1C
Hgb A1c MFr Bld: 5.2 % of total Hgb (ref <5.7)
Mean Plasma Glucose: 103 mg/dL
eAG (mmol/L): 5.7 mmol/L

## 2020-04-09 LAB — MAGNESIUM: Magnesium: 2.2 mg/dL (ref 1.5–2.5)

## 2020-04-09 LAB — TSH: TSH: 3.37 mIU/L (ref 0.40–4.50)

## 2020-04-09 LAB — VITAMIN D 25 HYDROXY (VIT D DEFICIENCY, FRACTURES): Vit D, 25-Hydroxy: 46 ng/mL (ref 30–100)

## 2020-05-04 ENCOUNTER — Other Ambulatory Visit: Payer: Self-pay | Admitting: Internal Medicine

## 2020-05-04 DIAGNOSIS — J0140 Acute pansinusitis, unspecified: Secondary | ICD-10-CM

## 2020-05-04 DIAGNOSIS — J0410 Acute tracheitis without obstruction: Secondary | ICD-10-CM

## 2020-05-04 MED ORDER — PROMETHAZINE-DM 6.25-15 MG/5ML PO SYRP
ORAL_SOLUTION | ORAL | 0 refills | Status: DC
Start: 2020-05-04 — End: 2020-07-15

## 2020-05-05 ENCOUNTER — Other Ambulatory Visit: Payer: Self-pay | Admitting: Adult Health Nurse Practitioner

## 2020-05-05 DIAGNOSIS — J0140 Acute pansinusitis, unspecified: Secondary | ICD-10-CM

## 2020-05-05 DIAGNOSIS — E669 Obesity, unspecified: Secondary | ICD-10-CM

## 2020-05-05 DIAGNOSIS — J0410 Acute tracheitis without obstruction: Secondary | ICD-10-CM

## 2020-05-05 MED ORDER — PROMETHAZINE-DM 6.25-15 MG/5ML PO SYRP
ORAL_SOLUTION | ORAL | 0 refills | Status: DC
Start: 2020-05-05 — End: 2020-07-09

## 2020-07-09 ENCOUNTER — Ambulatory Visit: Payer: 59 | Admitting: Adult Health

## 2020-07-09 ENCOUNTER — Other Ambulatory Visit: Payer: Self-pay

## 2020-07-09 ENCOUNTER — Encounter: Payer: Self-pay | Admitting: Adult Health

## 2020-07-09 VITALS — BP 116/80 | HR 95 | Temp 96.8°F | Ht 62.0 in | Wt 166.0 lb

## 2020-07-09 DIAGNOSIS — E663 Overweight: Secondary | ICD-10-CM

## 2020-07-09 DIAGNOSIS — E559 Vitamin D deficiency, unspecified: Secondary | ICD-10-CM

## 2020-07-09 DIAGNOSIS — I700 Atherosclerosis of aorta: Secondary | ICD-10-CM

## 2020-07-09 DIAGNOSIS — I10 Essential (primary) hypertension: Secondary | ICD-10-CM | POA: Diagnosis not present

## 2020-07-09 DIAGNOSIS — E782 Mixed hyperlipidemia: Secondary | ICD-10-CM | POA: Diagnosis not present

## 2020-07-09 DIAGNOSIS — N182 Chronic kidney disease, stage 2 (mild): Secondary | ICD-10-CM

## 2020-07-09 DIAGNOSIS — F325 Major depressive disorder, single episode, in full remission: Secondary | ICD-10-CM

## 2020-07-09 DIAGNOSIS — Z124 Encounter for screening for malignant neoplasm of cervix: Secondary | ICD-10-CM

## 2020-07-09 NOTE — Progress Notes (Signed)
FOLLOW UP  Assessment and Plan:   Hypertension Well controlled off of meds at this time Monitor blood pressure at home; patient to call if consistently greater than 130/80 Continue DASH diet.   Reminder to go to the ER if any CP, SOB, nausea, dizziness, severe HA, changes vision/speech, left arm numbness and tingling and jaw pain.  Cholesterol At goal on statin; continue medication  Continue low cholesterol diet and exercise.  Check lipid panel.   Other abnormal glucose Discussed risks associated with elevated glucose Recommend prudent diet, portion control, maintenance of weight in normal range, regular aerobic exercise Defer A1C; check annually - monitor sugars by CMP  GERD Off of meds; reports well controlled sx Discussed diet, avoiding triggers and other lifestyle changes  Overweight Long discussion about weight loss, diet, and exercise Recommended diet heavy in fruits and veggies and low in animal meats, cheeses, and dairy products, appropriate calorie intake Discussed appropriate weight for height Patient on phentermine/topamax with benefit and no SE, taking drug breaks; continue close follow up.  Discussed adding exercise focusing on muscle building, reduce processed carbohydrate, increase protein, fresh fruits/veggies, nuts/seeds, etc Follow up in 3 months  Depression/anxiety She reports fairly controlled by current regimen; discussed risk of SS with current agents; using buspar PRN only/occasionally; continue to monitor closely  Lifestyle discussed: diet/exerise, sleep hygiene, stress management, hydration  Vitamin D Def/ osteoporosis prevention At goal at recent check; continue supplement for goal range 60-100 Defer vitamin D level  Screening for cervical cancer -     PAP, TP IMAGING, WNL RFLX HPV  Continue diet and meds as discussed. Further disposition pending results of labs. Discussed med's effects and SE's.   Over 30 minutes of exam, counseling, chart  review, and critical decision making was performed.   Future Appointments  Date Time Provider [place]  04/08/2021  3:00 PM [name], NP [facility] None    ----------------------------------------------------------------------------------------------------------------------  HPI 55 y.o. female  presents for 3 month follow up on hypertension, cholesterol, GERD, blood sugar monitoring, weight, depression and vitamin D deficiency.   Overdue PAP planned with OV today. Denies hx of abnormal -   she is prescribed phentermine and topamax for weight loss, was off recently, just restarted.  While on the medication they have lost 0 lbs since last visit. They deny palpitations, anxiety, trouble sleeping, elevated BP.   BMI is Body mass index is 30.36 kg/m., she has been working on diet, no particular weight goal, generally trying to eat better, trying to move more, doing single portion nuts as snack, etc.   Wt Readings from Last 3 Encounters:  07/09/20 166 lb (75.3 kg)  04/08/20 164 lb 6.4 oz (74.6 kg)  12/25/19 161 lb (73 kg)   she has ongoing depression with anxious features and is currently on celexa 40 mg daily, wellbutrin XR 300 mg daily, and she is on buspar 15mg  PRN occasionally, does not benefit when needed.   she has a diagnosis of GERD which improved on nexium 40 mg, was able to taper off without recurrent symptoms.   Her blood pressure has been controlled at home, today their BP is BP: 116/80  She does workout. She denies chest pain, shortness of breath, dizziness.   She is on cholesterol medication (atorvastatin 20 mg daily) and denies myalgias. Her cholesterol is not at goal. The cholesterol last visit was:   Lab Results  Component Value Date   CHOL 199 04/08/2020   HDL 60 04/08/2020   LDLCALC 119 (  H) 04/08/2020   TRIG 94 04/08/2020   CHOLHDL 3.3 04/08/2020    She has been working on diet and exercise for blood sugar control, and denies increased appetite,  nausea, paresthesia of the feet, polydipsia, polyuria, visual disturbances and vomiting. Last A1C in the office was:  Lab Results  Component Value Date   HGBA1C 5.2 04/08/2020   Patient is on Vitamin D supplement  Lab Results  Component Value Date   VD25OH 46 04/08/2020      Current Medications:  Current Outpatient Medications on File Prior to Visit  Medication Sig  . aspirin 81 MG chewable tablet Chew by mouth daily.  Marland Kitchen atorvastatin (LIPITOR) 20 MG tablet TAKE 1 TABLET BY MOUTH DAILY FOR CHOLESTEROL  . buPROPion (WELLBUTRIN XL) 300 MG 24 hr tablet Take 1 tablet every morning for Mood and Focus and Concentration  . busPIRone (BUSPAR) 15 MG tablet Take 1 tablet once daily as needed for breakthrough anxiety.  . calcium citrate (CALCITRATE - DOSED IN MG ELEMENTAL CALCIUM) 950 MG tablet Take 200 mg of elemental calcium by mouth daily.  . citalopram (CELEXA) 40 MG tablet Take 1 tablet Daily for Anxiety & Mood  . cyclobenzaprine (FLEXERIL) 10 MG tablet Take     1/2 to 1 tablet      2 to 3  x /day       as needed for  Muscle Spasm  . Fexofenadine HCl (ALLEGRA PO) Take by mouth.  Marland Kitchen ibuprofen (ADVIL,MOTRIN) 600 MG tablet TAKE 1 TABLET BY MOUTH EVERY 6 HOURS AS NEEDED  . Multiple Vitamins-Minerals (MULTIVITAMIN ADULTS PO) Take by mouth daily.  . phentermine (ADIPEX-P) 37.5 MG tablet Take   1/2 to 1 tablet  every Morning  for Dieting & Weight loss  . PROAIR HFA 108 (90 Base) MCG/ACT inhaler INHALE 1 PUFF PO Q 4 H (Patient taking differently: INHALE 1 PUFF PO Q 4 H PRN)  . promethazine-dextromethorphan (PROMETHAZINE-DM) 6.25-15 MG/5ML syrup Take  1 to 2 teaspoonful  every 4 hours  if needed for cough  . topiramate (TOPAMAX) 50 MG tablet Take 1/2 to 1 tablet 2 x /day at Suppertime & Bedtime for Dieting & Weight Loss  . vitamin B-12 (CYANOCOBALAMIN) 100 MCG tablet Take 100 mcg by mouth daily.   . Vitamin D, Ergocalciferol, (DRISDOL) 1.25 MG (50000 UNIT) CAPS capsule Take     1 capsule     every 7 days        for severe Vit D Deficiency  . promethazine-dextromethorphan (PROMETHAZINE-DM) 6.25-15 MG/5ML syrup Take 1 to 2 teaspoonfuls every 4 hours if needed for cough   No current facility-administered medications on file prior to visit.     Allergies: No Known Allergies   Medical History:  Past Medical History:  Diagnosis Date  . Allergy   . Asthma   . Depression   . GERD (gastroesophageal reflux disease) 2005  . 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 Hyperlipidemia 2012  . Hypertension 2012  . Vitamin D deficiency    Family history- Reviewed and unchanged Social history- Reviewed and unchanged   Review of Systems:  Review of Systems  Constitutional: Negative for malaise/fatigue and weight loss.  HENT: Negative for hearing loss and tinnitus.   Eyes: Negative for blurred vision and double vision.  Respiratory: Negative for cough, shortness of breath and wheezing.   Cardiovascular: Negative for chest pain, palpitations, orthopnea, claudication and leg swelling.  Gastrointestinal: Negative for abdominal pain, blood in stool, constipation, diarrhea,  heartburn, melena, nausea and vomiting.  Genitourinary: Negative.   Musculoskeletal: Negative for joint pain and myalgias.  Skin: Negative for rash.  Neurological: Negative for dizziness, tingling, sensory change, weakness and headaches.  Endo/Heme/Allergies: Negative for polydipsia.  Psychiatric/Behavioral: Negative for depression and substance abuse. The patient is nervous/anxious (chronic since childhood, improved with medication).   All other systems reviewed and are negative.    Physical Exam: BP 116/80   Pulse 95   Temp (!) 96.8 F (36 C)   Ht 5\' 2"  (1.575 m)   Wt 166 lb (75.3 kg)   SpO2 98%   BMI 30.36 kg/m  Wt Readings from Last 3 Encounters:  07/09/20 166 lb (75.3 kg)  04/08/20 164 lb 6.4 oz (74.6 kg)  12/25/19 161 lb (73 kg)   General Appearance: Well nourished, in no apparent distress. Eyes: PERRLA,  EOMs, conjunctiva no swelling or erythema Sinuses: No Frontal/maxillary tenderness ENT/Mouth: Ext aud canals clear, TMs without erythema, bulging. No erythema, swelling, or exudate on post pharynx.  Tonsils not swollen or erythematous. Hearing normal.  Neck: Supple, thyroid normal.  Respiratory: Respiratory effort normal, BS equal bilaterally without rales, rhonchi, wheezing or stridor.  Cardio: RRR with no MRGs. Brisk peripheral pulses without edema.  Abdomen: Soft, + BS.  Non tender, no guarding, rebound, hernias, masses. Lymphatics: Non tender without lymphadenopathy.  Musculoskeletal: Full ROM, 5/5 strength, Normal gait Skin: Warm, dry without rashes, lesions, ecchymosis.  Neuro: Cranial nerves intact. No cerebellar symptoms.  Psych: Awake and oriented X 3, mildly anxious affect, Insight and Judgment appropriate.  GU: Female genitalia: normal external genitalia, vulva, vagina, cervix, uterus and adnexa Chaperoned by Chancy Hurter, CMA   Izora Ribas, NP 3:50 PM [facility]

## 2020-07-10 LAB — COMPLETE METABOLIC PANEL WITH GFR
AG Ratio: 1.5 (calc) (ref 1.0–2.5)
ALT: 15 U/L (ref 6–29)
AST: 18 U/L (ref 10–35)
Albumin: 4.3 g/dL (ref 3.6–5.1)
Alkaline phosphatase (APISO): 150 U/L (ref 37–153)
BUN: 15 mg/dL (ref 7–25)
CO2: 24 mmol/L (ref 20–32)
Calcium: 9.4 mg/dL (ref 8.6–10.4)
Chloride: 105 mmol/L (ref 98–110)
Creat: 0.9 mg/dL (ref 0.50–1.05)
GFR, Est African American: 83 mL/min/{1.73_m2} (ref >=60)
GFR, Est Non African American: 71 mL/min/{1.73_m2} (ref >=60)
Globulin: 2.8 g/dL (calc) (ref 1.9–3.7)
Glucose, Bld: 71 mg/dL (ref 65–99)
Potassium: 4 mmol/L (ref 3.5–5.3)
Sodium: 140 mmol/L (ref 135–146)
Total Bilirubin: 0.3 mg/dL (ref 0.2–1.2)
Total Protein: 7.1 g/dL (ref 6.1–8.1)

## 2020-07-10 LAB — CBC WITH DIFFERENTIAL/PLATELET
Absolute Monocytes: 474 cells/uL (ref 200–950)
Basophils Absolute: 60 cells/uL (ref 0–200)
Basophils Relative: 1 %
Eosinophils Absolute: 102 cells/uL (ref 15–500)
Eosinophils Relative: 1.7 %
HCT: 40.6 % (ref 35.0–45.0)
Hemoglobin: 13.5 g/dL (ref 11.7–15.5)
Lymphs Abs: 1932 cells/uL (ref 850–3900)
MCH: 30.6 pg (ref 27.0–33.0)
MCHC: 33.3 g/dL (ref 32.0–36.0)
MCV: 92.1 fL (ref 80.0–100.0)
MPV: 11.2 fL (ref 7.5–12.5)
Monocytes Relative: 7.9 %
Neutro Abs: 3432 cells/uL (ref 1500–7800)
Neutrophils Relative %: 57.2 %
Platelets: 227 10*3/uL (ref 140–400)
RBC: 4.41 10*6/uL (ref 3.80–5.10)
RDW: 12.4 % (ref 11.0–15.0)
Total Lymphocyte: 32.2 %
WBC: 6 10*3/uL (ref 3.8–10.8)

## 2020-07-10 LAB — TSH: TSH: 3.56 mIU/L (ref 0.40–4.50)

## 2020-07-10 LAB — LIPID PANEL
Cholesterol: 171 mg/dL (ref <200)
HDL: 42 mg/dL — ABNORMAL LOW (ref >=50)
LDL Cholesterol (Calc): 97 mg/dL (calc)
Non-HDL Cholesterol (Calc): 129 mg/dL (calc) (ref <130)
Total CHOL/HDL Ratio: 4.1 (calc) (ref <5.0)
Triglycerides: 220 mg/dL — ABNORMAL HIGH (ref <150)

## 2020-07-14 LAB — PAP, TP IMAGING W/ HPV RNA, RFLX HPV TYPE 16,18/45: HPV DNA High Risk: NOT DETECTED

## 2020-07-14 LAB — PAP, TP IMAGING, WNL RFLX HPV

## 2020-07-15 ENCOUNTER — Other Ambulatory Visit: Payer: Self-pay

## 2020-07-15 DIAGNOSIS — M542 Cervicalgia: Secondary | ICD-10-CM

## 2020-07-15 DIAGNOSIS — F32A Depression, unspecified: Secondary | ICD-10-CM

## 2020-07-15 DIAGNOSIS — J0410 Acute tracheitis without obstruction: Secondary | ICD-10-CM

## 2020-07-15 DIAGNOSIS — J0140 Acute pansinusitis, unspecified: Secondary | ICD-10-CM

## 2020-07-16 MED ORDER — PROMETHAZINE-DM 6.25-15 MG/5ML PO SYRP: ORAL_SOLUTION | ORAL | 0 refills | Status: DC

## 2020-07-16 MED ORDER — BUPROPION HCL ER (XL) 300 MG PO TB24: ORAL_TABLET | ORAL | 3 refills | Status: DC

## 2020-07-16 MED ORDER — CYCLOBENZAPRINE HCL 10 MG PO TABS: ORAL_TABLET | ORAL | 0 refills | Status: DC

## 2020-07-23 ENCOUNTER — Telehealth: Payer: Self-pay

## 2020-07-23 NOTE — Telephone Encounter (Signed)
States that she has a sore throat, sinus congestion, nasal drainage. Tested negative for Covid yesterday. Symptoms started 2 days ago. Also, has ear pain. Wanting to only see Caryl Pina, appointment was offered to see Dr. Melford Aase but didn't want it.

## 2020-07-24 NOTE — Telephone Encounter (Signed)
Patient aware. Will call next week if not feeling any better.

## 2020-09-10 ENCOUNTER — Encounter (HOSPITAL_BASED_OUTPATIENT_CLINIC_OR_DEPARTMENT_OTHER): Payer: Self-pay | Admitting: *Deleted

## 2020-09-10 ENCOUNTER — Emergency Department (HOSPITAL_BASED_OUTPATIENT_CLINIC_OR_DEPARTMENT_OTHER): Payer: 59 | Admitting: Radiology

## 2020-09-10 ENCOUNTER — Other Ambulatory Visit: Payer: Self-pay

## 2020-09-10 ENCOUNTER — Emergency Department (HOSPITAL_BASED_OUTPATIENT_CLINIC_OR_DEPARTMENT_OTHER)
Admission: EM | Admit: 2020-09-10 | Discharge: 2020-09-10 | Disposition: A | Discharge status: Home / Self Care | Payer: 59 | Attending: Emergency Medicine | Admitting: Emergency Medicine

## 2020-09-10 DIAGNOSIS — Z7982 Long term (current) use of aspirin: Secondary | ICD-10-CM | POA: Insufficient documentation

## 2020-09-10 DIAGNOSIS — X58XXXA Exposure to other specified factors, initial encounter: Secondary | ICD-10-CM | POA: Diagnosis not present

## 2020-09-10 DIAGNOSIS — N182 Chronic kidney disease, stage 2 (mild): Secondary | ICD-10-CM | POA: Diagnosis not present

## 2020-09-10 DIAGNOSIS — S3992XA Unspecified injury of lower back, initial encounter: Secondary | ICD-10-CM | POA: Diagnosis present

## 2020-09-10 DIAGNOSIS — Z79899 Other long term (current) drug therapy: Secondary | ICD-10-CM | POA: Insufficient documentation

## 2020-09-10 DIAGNOSIS — J45909 Unspecified asthma, uncomplicated: Secondary | ICD-10-CM | POA: Diagnosis not present

## 2020-09-10 DIAGNOSIS — I129 Hypertensive chronic kidney disease with stage 1 through stage 4 chronic kidney disease, or unspecified chronic kidney disease: Secondary | ICD-10-CM | POA: Diagnosis not present

## 2020-09-10 DIAGNOSIS — S300XXA Contusion of lower back and pelvis, initial encounter: Secondary | ICD-10-CM | POA: Diagnosis not present

## 2020-09-10 NOTE — ED Triage Notes (Signed)
Pt was on the slide and her husband was behind her and ran onto her 10 days ago.  Pain to her tailbone is getting worst.

## 2020-09-10 NOTE — ED Provider Notes (Signed)
[facility] EMERGENCY DEPT Provider Note   CSN: [id number] Arrival date & time: 09/10/20  1110     History Chief Complaint  Patient presents with   Tailbone Pain    Erin Nichols is a 55 y.o. female.  HPI 55 year old female presents with buttocks pain.  10 days ago she was on a slide and her husband was also on the slide and they ran into each other.  She is been having pain in her tailbone ever since.  It has progressively worsened.  She has been taking Tylenol with no relief.  Hurts worse to get up to a standing position.  No weakness or numbness or incontinence. No rectal pain.  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    Hyperlipidemia 2012   Hypertension 2012   Vitamin D deficiency     Patient Active Problem List   Diagnosis Date Noted   CKD (chronic kidney disease) stage 2, GFR 60-89 ml/min 04/09/2020   Aortic atherosclerosis ([place]) 01/02/2019   Overweight (BMI 25.0-29.9) 02/13/2017   Essential hypertension 11/08/2016   Hyperlipidemia, mixed 11/08/2016   Abnormal glucose 11/08/2016   Vitamin D deficiency 11/08/2016   Gastroesophageal reflux disease 11/08/2016   Asthma due to environmental allergies 11/08/2016   Major depression in remission ([place]) 11/08/2016    Past Surgical History:  Procedure Laterality Date   COLONOSCOPY N/A 2015   screening at age 5 - Negative   FRACTURE SURGERY  1988   facial Fx , nose, Ankle both Arms   l ulnar nonunion  1992   NASAL SEPTUM SURGERY  1991   nonunion 5th metatarsal  2008   r sub talar fusion  2008   redo subtalar fusion  2013   rt ankle surg for non union  2013   SPINE SURGERY  2011   Cx5 - Cx6 fusion w/plate/screws   TONSILLECTOMY AND ADENOIDECTOMY  1972     OB History   No obstetric history on file.     Family History  Problem Relation Age of Onset   Diabetes Mother     Leukemia Mother    CAD Father    Cirrhosis Father    Alcohol abuse Father    Cirrhosis Sister        hep C, alcohol, drug user   Hypertension Brother    Heart disease Brother    Heart failure Brother    Stroke Maternal Grandmother     Social History   Tobacco Use   Smoking status: Never   Smokeless tobacco: Never  Vaping Use   Vaping Use: Never used  Substance Use Topics   Alcohol use: No   Drug use: No    Home Medications Prior to Admission medications   Medication Sig Start Date End Date Taking? Authorizing Provider  aspirin 81 MG chewable tablet Chew by mouth daily.   Yes [provider]  atorvastatin (LIPITOR) 20 MG tablet TAKE 1 TABLET BY MOUTH DAILY FOR CHOLESTEROL 03/18/20  Yes [name], NP  buPROPion (WELLBUTRIN XL) 300 MG 24 hr tablet Take 1 tablet every morning for Mood and Focus and Concentration 07/16/20  Yes [name], NP  calcium citrate (CALCITRATE - DOSED IN MG ELEMENTAL CALCIUM) 950 MG tablet Take 200 mg of elemental calcium by mouth daily.   Yes [provider]  citalopram (CELEXA) 40 MG  tablet Take 1 tablet Daily for Anxiety & Mood 01/29/19  Yes Unk Pinto, MD  Multiple Vitamins-Minerals (MULTIVITAMIN ADULTS PO) Take by mouth daily.   Yes [provider]  phentermine (ADIPEX-P) 37.5 MG tablet Take   1/2 to 1 tablet  every Morning  for Dieting & Weight loss 05/05/20  Yes Unk Pinto, MD  topiramate (TOPAMAX) 50 MG tablet Take 1/2 to 1 tablet 2 x /day at Suppertime & Bedtime for Dieting & Weight Loss 03/17/20  Yes Unk Pinto, MD  vitamin B-12 (CYANOCOBALAMIN) 100 MCG tablet Take 100 mcg by mouth daily.    Yes [provider]  cyclobenzaprine (FLEXERIL) 10 MG tablet Take 1/2 to 1 tablet 2 to 3 x /day as needed for  Muscle Spasm 07/16/20   [name], NP  Fexofenadine HCl (ALLEGRA PO) Take by mouth.    [provider]  PROAIR HFA 108 [phone] Base) MCG/ACT inhaler INHALE 1 PUFF PO Q 4  H Patient taking differently: INHALE 1 PUFF PO Q 4 H PRN 03/03/17   Unk Pinto, MD  promethazine-dextromethorphan (PROMETHAZINE-DM) 6.25-15 MG/5ML syrup Take  1 to 2 teaspoonful  every 4 hours  if needed for cough 07/16/20   [name], NP  Vitamin D, Ergocalciferol, (DRISDOL) 1.25 MG (50000 UNIT) CAPS capsule Take     1 capsule     every 7 days       for severe Vit D Deficiency 11/18/19   Unk Pinto, MD    Allergies    Patient has no known allergies.  Review of Systems   Review of Systems  Musculoskeletal:  Positive for arthralgias.  Neurological:  Negative for weakness and numbness.   Physical Exam Updated Vital Signs BP (!) 137/100 (BP Location: Right Arm)   Pulse 99   Temp 98.3 F (36.8 C)   Resp 14   Ht 5\' 2"  (1.575 m)   Wt 75.8 kg   SpO2 96%   BMI 30.54 kg/m   Physical Exam Vitals and nursing note reviewed. Exam conducted with a chaperone present.  Constitutional:      Appearance: She is well-developed.  HENT:     Head: Normocephalic and atraumatic.     Right Ear: External ear normal.     Left Ear: External ear normal.     Nose: Nose normal.  Eyes:     General:        Right eye: No discharge.        Left eye: No discharge.  Pulmonary:     Effort: Pulmonary effort is normal.  Abdominal:     General: There is no distension.     Palpations: Abdomen is soft.     Tenderness: There is no abdominal tenderness.  Musculoskeletal:       Back:  Skin:    General: Skin is warm and dry.  Neurological:     Mental Status: She is alert.     Comments: 5/5 strength in BLE. Grossly normal sensation  Psychiatric:        Mood and Affect: Mood is not anxious.    ED Results / Procedures / Treatments   Labs (all labs ordered are listed, but only abnormal results are displayed) Labs Reviewed - No data to display  EKG None  Radiology DG Sacrum/Coccyx  Result Date: 09/10/2020 CLINICAL DATA:  Injury 10 days ago.  Pain EXAM: SACRUM AND COCCYX - 2+ VIEW  COMPARISON:  None. FINDINGS: There is no evidence of fracture or other focal bone lesions. IMPRESSION:  Negative. Electronically Signed   By: Franchot Gallo M.D.   On: 09/10/2020 14:14    Procedures Procedures   Medications Ordered in ED Medications - No data to display  ED Course  I have reviewed the triage vital signs and the nursing notes.  Pertinent labs & imaging results that were available during my care of the patient were reviewed by me and considered in my medical decision making (see chart for details).    MDM Rules/Calculators/A&P                           We discussed limitations of x-ray the patient would like to proceed.  X-ray shows no obvious fracture.  Clinically we will treat the same with a soft pillow and over-the-counter pain medicines.  No neuro symptoms to suggest a spinal cord emergency or injury.  Appears stable for discharge home with PCP follow-up as needed. Final Clinical Impression(s) / ED Diagnoses Final diagnoses:  Contusion of coccyx, initial encounter    Rx / DC Orders ED Discharge Orders     None        Sherwood Gambler, MD 09/10/20 1434

## 2020-09-10 NOTE — Discharge Instructions (Addendum)
If you develop worsening, recurrent, or continued back pain, numbness or weakness in the legs, incontinence of your bowels or bladders, numbness of your buttocks, fever, abdominal pain, or any other new/concerning symptoms then return to the ER for evaluation.  

## 2020-09-10 NOTE — ED Notes (Signed)
Patient discharged home.   Will follow up with family MD for BP reading

## 2020-09-14 ENCOUNTER — Other Ambulatory Visit: Payer: Self-pay | Admitting: Adult Health

## 2020-09-14 ENCOUNTER — Other Ambulatory Visit: Payer: Self-pay | Admitting: Adult Health Nurse Practitioner

## 2020-09-14 ENCOUNTER — Other Ambulatory Visit: Payer: Self-pay

## 2020-09-14 DIAGNOSIS — M542 Cervicalgia: Secondary | ICD-10-CM

## 2020-09-14 DIAGNOSIS — F32A Depression, unspecified: Secondary | ICD-10-CM

## 2020-09-14 DIAGNOSIS — E669 Obesity, unspecified: Secondary | ICD-10-CM

## 2020-09-14 MED ORDER — CITALOPRAM HYDROBROMIDE 40 MG PO TABS: ORAL_TABLET | ORAL | 3 refills | Status: DC

## 2020-11-06 ENCOUNTER — Other Ambulatory Visit: Payer: Self-pay | Admitting: Adult Health

## 2020-11-06 DIAGNOSIS — J0410 Acute tracheitis without obstruction: Secondary | ICD-10-CM

## 2020-11-06 DIAGNOSIS — J0140 Acute pansinusitis, unspecified: Secondary | ICD-10-CM

## 2020-12-21 ENCOUNTER — Other Ambulatory Visit: Payer: Self-pay | Admitting: Internal Medicine

## 2020-12-21 DIAGNOSIS — E669 Obesity, unspecified: Secondary | ICD-10-CM

## 2020-12-21 MED ORDER — TOPIRAMATE 50 MG PO TABS: ORAL_TABLET | ORAL | 1 refills | Status: DC

## 2020-12-24 ENCOUNTER — Ambulatory Visit (INDEPENDENT_AMBULATORY_CARE_PROVIDER_SITE_OTHER): Payer: 59

## 2020-12-24 ENCOUNTER — Other Ambulatory Visit: Payer: Self-pay

## 2020-12-24 VITALS — Temp 97.9°F

## 2020-12-24 DIAGNOSIS — Z23 Encounter for immunization: Secondary | ICD-10-CM | POA: Diagnosis not present

## 2020-12-25 ENCOUNTER — Other Ambulatory Visit: Payer: Self-pay | Admitting: Internal Medicine

## 2020-12-25 DIAGNOSIS — J0140 Acute pansinusitis, unspecified: Secondary | ICD-10-CM

## 2020-12-25 DIAGNOSIS — J0410 Acute tracheitis without obstruction: Secondary | ICD-10-CM

## 2021-01-08 ENCOUNTER — Encounter: Payer: Self-pay | Admitting: Adult Health

## 2021-01-08 ENCOUNTER — Other Ambulatory Visit: Payer: Self-pay

## 2021-01-08 ENCOUNTER — Ambulatory Visit: Payer: 59 | Admitting: Adult Health

## 2021-01-08 VITALS — BP 110/80 | HR 101 | Temp 97.5°F | Wt 170.0 lb

## 2021-01-08 DIAGNOSIS — Z1152 Encounter for screening for COVID-19: Secondary | ICD-10-CM

## 2021-01-08 DIAGNOSIS — J069 Acute upper respiratory infection, unspecified: Secondary | ICD-10-CM | POA: Diagnosis not present

## 2021-01-08 DIAGNOSIS — J45909 Unspecified asthma, uncomplicated: Secondary | ICD-10-CM

## 2021-01-08 DIAGNOSIS — J45901 Unspecified asthma with (acute) exacerbation: Secondary | ICD-10-CM | POA: Diagnosis not present

## 2021-01-08 DIAGNOSIS — R062 Wheezing: Secondary | ICD-10-CM | POA: Diagnosis not present

## 2021-01-08 DIAGNOSIS — R6889 Other general symptoms and signs: Secondary | ICD-10-CM

## 2021-01-08 LAB — POCT INFLUENZA A/B
Influenza A, POC: NEGATIVE
Influenza B, POC: NEGATIVE

## 2021-01-08 LAB — POC COVID19 BINAXNOW: SARS Coronavirus 2 Ag: NEGATIVE

## 2021-01-08 MED ORDER — FLUTICASONE FUROATE-VILANTEROL 200-25 MCG/ACT IN AEPB: INHALATION_SPRAY | RESPIRATORY_TRACT | 0 refills | Status: DC

## 2021-01-08 MED ORDER — AZITHROMYCIN 250 MG PO TABS
ORAL_TABLET | ORAL | 1 refills | Status: AC
Start: 2021-01-08 — End: 2021-01-13

## 2021-01-08 MED ORDER — PROMETHAZINE-CODEINE 6.25-10 MG/5ML PO SYRP: 5.0000 mL | ORAL_SOLUTION | Freq: Four times a day (QID) | ORAL | 0 refills | Status: DC | PRN

## 2021-01-08 MED ORDER — IPRATROPIUM-ALBUTEROL 0.5-2.5 (3) MG/3ML IN SOLN: 3.0000 mL | Freq: Once | RESPIRATORY_TRACT | Status: DC

## 2021-01-08 MED ORDER — PROAIR HFA 108 (90 BASE) MCG/ACT IN AERS: INHALATION_SPRAY | RESPIRATORY_TRACT | 1 refills | Status: DC

## 2021-01-08 MED ORDER — TRELEGY ELLIPTA 200-62.5-25 MCG/ACT IN AEPB: 1.0000 | INHALATION_SPRAY | Freq: Every day | RESPIRATORY_TRACT | 0 refills | Status: DC

## 2021-01-08 MED ORDER — PREDNISONE 20 MG PO TABS: ORAL_TABLET | ORAL | 0 refills | Status: DC

## 2021-01-08 MED ORDER — PROMETHAZINE-CODEINE 6.25-10 MG/5ML PO SYRP
5.0000 mL | ORAL_SOLUTION | Freq: Four times a day (QID) | ORAL | 0 refills | Status: DC | PRN
Start: 2021-01-08 — End: 2021-01-08

## 2021-01-08 NOTE — Patient Instructions (Addendum)
   Please do breo 1 puff and hold 15-20 sec, once daily, rinse mouth/gargle/brush teeth after use.   Albuterol only if needed for wheezing/tightness.   Monitor closely, if getting worse despite treatment plan please get checked out in ED.    HOW TO TREAT COUGH AND COLD SYMPTOMS: -Symptoms usually last at least 1 week with the worst symptoms being around day 4. - colds usually start with a sore throat and end with a cough, and the cough can take 2 weeks to get better. -There are a lot of combination medications (Dayquil, Nyquil, Vicks 44, tyelnol cold and sinus, ETC). Please look at the ingredients on the back so that you are treating the correct symptoms and not doubling up on medications/ingredients.  Medicines you can use  Nasal congestion Little Remedies saline spray (aerosol/mist)- can try this, it is in the kids section - pseudoephedrine (Sudafed)- behind the counter, do not use if you have high blood pressure, medicine that have -D in them. - phenylephrine (Sudafed PE) -Dextormethorphan + chlorpheniramine (Coridcidin HBP)- okay if you have high blood pressure -Oxymetazoline (Afrin) nasal spray- LIMIT to 3 days -Saline nasal spray -Neti pot (used distilled or bottled water)  Ear pain/congestion -pseudoephedrine (sudafed) - Nasonex/flonase nasal spray  Fever -Acetaminophen (Tyelnol) -Ibuprofen (Advil, motrin, aleve)  Sore Throat -Acetaminophen (Tyelnol) -Ibuprofen (Advil, motrin, aleve) -Drink a lot of water -Gargle with salt water - Rest your voice (don't talk) -Throat sprays -Cough drops  Body Aches -Acetaminophen (Tyelnol) -Ibuprofen (Advil, motrin, aleve)  Headache -Acetaminophen (Tyelnol) -Ibuprofen (Advil, motrin, aleve) - Exedrin, Exedrin Migraine  Allergy symptoms (cough, sneeze, runny nose, itchy eyes) -Claritin or loratadine cheapest but likely the weakest -Zyrtec or certizine at night because it can make you sleepy -The strongest is allegra  or fexafinadine Cheapest at [employer], [employer], [employer]  Cough -Dextromethorphan (Delsym)- medicine that has DM in it -Guafenesin (Mucinex/Robitussin) - cough drops - drink lots of water  Chest Congestion -Guafenesin (Mucinex/Robitussin)  Red Itchy Eyes - Naphcon-A  Upset Stomach - Bland diet (nothing spicy, greasy, fried, and high acid foods like tomatoes, oranges, berries) -OKAY- cereal, bread, soup, crackers, rice -Eat smaller more frequent meals -reduce caffeine, no alcohol -Loperamide (Imodium-AD) if diarrhea -Prevacid for heart burn  General health when sick -Hydration -wash your hands frequently -keep surfaces clean -change pillow cases and sheets often -Get fresh air but do not exercise strenuously -Vitamin D, double up on it - Vitamin C -Zinc

## 2021-01-08 NOTE — Progress Notes (Signed)
Assessment and Plan:  Erin Nichols was seen today for acute visit.  Diagnoses and all orders for this visit:  Asthma exacerbation, mild URI with mild asthma exacerbation, neg flu/covid 19;  neb in office with sig improved wheezing inhaler refilled, given Trelegy sample x 2 weeks,  Meds as prescribed; monitor closely; if worsening present to ED -     predniSONE (DELTASONE) 20 MG tablet; 2 tablets daily for 3 days, 1 tablet daily for 4 days. -     fluticasone furoate-vilanterol (BREO ELLIPTA) 200-25 MCG/ACT AEPB; Use one puff daily for asthma. Rinse mouth/gargle/brush teeth after each use. -     PROAIR HFA 108 (90 Base) MCG/ACT inhaler; INHALE 1 PUFF EVERY 4 HOURS AS NEEDED FOR WHEEZING. -     azithromycin (ZITHROMAX) 250 MG tablet; Take 2 tablets (500 mg) on  Day 1,  followed by 1 tablet (250 mg) once daily on Days 2 through 5. -     promethazine-codeine (PHENERGAN WITH CODEINE) 6.25-10 MG/5ML syrup; Take 5 mLs by mouth every 6 (six) hours as needed for cough. Max: 105mL per day -     POC COVID-19  -     POCT Influenza A/B  Wheezing -     ipratropium-albuterol (DUONEB) 0.5-2.5 (3) MG/3ML nebulizer solution 3 mL  Further disposition pending results of labs. Discussed med's effects and SE's.   Over 30 minutes of exam, counseling, chart review, and critical decision making was performed.   Future Appointments  Date Time Provider [place]  04/08/2021  3:00 PM [name], NP [facility] None    ------------------------------------------------------------------------------------------------------------------   HPI BP 110/80   Pulse (!) 101   Temp (!) 97.5 F (36.4 C)   Wt 170 lb (77.1 kg)   SpO2 98%   BMI 31.09 kg/m  55 y.o.female with hx of mild asthma presents for of 3 of URI. Husband had similar sx a few days prior to her, didn't get checked by medical professional, is improving. She was negative for rapid covid 19 and flu A/B today. She is vaccinated for flu this season  and covid 44.   She reports sx began 3 days ago with chills, coughing, sneezing, overall progressive, last night was the worst. Very dry mouth. Temp up to 101.5, productive yellow/green, having some wheezing (out of albuterol), poor appetite. Denies dyspnea or chest pain other than with coughing fits. Denies profound fatigue, dizziness.   She has been taking mucinex, promethazine DM. She is not currently on singulair or daily inhaler, rare flares and reports has done well for several years.     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   atorvastatin (LIPITOR) 20 MG tablet TAKE 1 TABLET BY MOUTH DAILY FOR CHOLESTEROL   buPROPion (WELLBUTRIN XL) 300 MG 24 hr tablet Take 1 tablet every morning for Mood and Focus and Concentration   calcium citrate (CALCITRATE - DOSED IN MG ELEMENTAL CALCIUM) 950 MG tablet Take 200 mg of elemental calcium by mouth daily.   citalopram (CELEXA) 40 MG tablet Take 1 tablet Daily for Anxiety & Mood   cyclobenzaprine (FLEXERIL)  10 MG tablet TAKE 1/2 TO 1 TABLET BY MOUTH 2 TO 3 TIMES PER DAY AS NEEDED FOR MUSCLE SPASM   Fexofenadine HCl (ALLEGRA PO) Take by mouth.   Multiple Vitamins-Minerals (MULTIVITAMIN ADULTS PO) Take by mouth daily.   phentermine (ADIPEX-P) 37.5 MG tablet Take 1 tablet every Morning for Dieting & Weight Loss / Patient knows to take by mouth   topiramate (TOPAMAX) 50 MG tablet Take 1/2 to 1 tablet 2 x /day at Suppertime & Bedtime for Dieting & Weight Loss   vitamin B-12 (CYANOCOBALAMIN) 100 MCG tablet Take 100 mcg by mouth daily.    Vitamin D, Ergocalciferol, (DRISDOL) 1.25 MG (50000 UNIT) CAPS capsule Take      1 capsule     every 7 days       for severe Vit D Deficiency   No current facility-administered medications on file prior to visit.    ROS: all negative except above.   Physical Exam:  BP 110/80   Pulse (!) 101   Temp (!) 97.5 F (36.4 C)   Wt 170 lb (77.1 kg)   SpO2 98%   BMI 31.09 kg/m   General Appearance: Well nourished, well dressed adult female, appears feeling unwell though in no acute distress. Eyes: PERRLA, conjunctiva no swelling or erythema Sinuses: No Frontal/maxillary tenderness ENT/Mouth: Ext aud canals clear, TMs without erythema, bulging. No erythema, swelling, or exudate on post pharynx.  Tonsils not swollen or erythematous. Hearing normal.  Neck: Supple Respiratory: Respiratory effort mildly increased/mild tachypnea, no retractions or accessory muscle use,  BS with faint inspiratory wheeze bil without rales, rhonchi, or stridor.  Cardio: RRR with no MRGs. Brisk peripheral pulses without edema.  Abdomen: Soft, + BS.  Non tender Lymphatics: Non tender without lymphadenopathy.  Musculoskeletal: normal gait.  Skin: Warm, dry without rashes, lesions, ecchymosis.  Neuro: Normal muscle tone Psych: Awake and oriented X 3, normal affect, Insight and Judgment appropriate.     Izora Ribas, NP 11:58 AM [facility]

## 2021-01-09 ENCOUNTER — Other Ambulatory Visit: Payer: Self-pay | Admitting: Internal Medicine

## 2021-01-09 ENCOUNTER — Other Ambulatory Visit: Payer: Self-pay | Admitting: Adult Health

## 2021-01-09 DIAGNOSIS — J069 Acute upper respiratory infection, unspecified: Secondary | ICD-10-CM

## 2021-01-09 MED ORDER — PROMETHAZINE-CODEINE 6.25-10 MG/5ML PO SYRP: 5.0000 mL | ORAL_SOLUTION | Freq: Four times a day (QID) | ORAL | 0 refills | Status: DC | PRN

## 2021-01-11 ENCOUNTER — Encounter: Payer: Self-pay | Admitting: Adult Health

## 2021-01-11 ENCOUNTER — Other Ambulatory Visit: Payer: Self-pay | Admitting: Internal Medicine

## 2021-01-11 DIAGNOSIS — J45901 Unspecified asthma with (acute) exacerbation: Secondary | ICD-10-CM

## 2021-01-11 DIAGNOSIS — J45909 Unspecified asthma, uncomplicated: Secondary | ICD-10-CM

## 2021-01-11 MED ORDER — ALBUTEROL SULFATE HFA 108 (90 BASE) MCG/ACT IN AERS: INHALATION_SPRAY | RESPIRATORY_TRACT | 3 refills | Status: DC

## 2021-01-12 ENCOUNTER — Other Ambulatory Visit: Payer: Self-pay | Admitting: Internal Medicine

## 2021-01-12 DIAGNOSIS — Z1231 Encounter for screening mammogram for malignant neoplasm of breast: Secondary | ICD-10-CM

## 2021-02-03 ENCOUNTER — Other Ambulatory Visit: Payer: Self-pay | Admitting: Adult Health

## 2021-02-03 ENCOUNTER — Ambulatory Visit
Admission: RE | Admit: 2021-02-03 | Discharge: 2021-02-03 | Disposition: A | Discharge status: Home / Self Care | Payer: 59 | Source: Ambulatory Visit | Attending: Adult Health | Admitting: Adult Health

## 2021-02-03 ENCOUNTER — Encounter: Payer: Self-pay | Admitting: Adult Health

## 2021-02-03 ENCOUNTER — Other Ambulatory Visit: Payer: Self-pay

## 2021-02-03 DIAGNOSIS — R053 Chronic cough: Secondary | ICD-10-CM

## 2021-02-03 DIAGNOSIS — J45901 Unspecified asthma with (acute) exacerbation: Secondary | ICD-10-CM

## 2021-02-04 ENCOUNTER — Other Ambulatory Visit: Payer: Self-pay | Admitting: Adult Health

## 2021-02-04 DIAGNOSIS — J069 Acute upper respiratory infection, unspecified: Secondary | ICD-10-CM

## 2021-02-04 MED ORDER — PROMETHAZINE-CODEINE 6.25-10 MG/5ML PO SYRP: 5.0000 mL | ORAL_SOLUTION | Freq: Four times a day (QID) | ORAL | 0 refills | Status: DC | PRN

## 2021-02-04 MED ORDER — PREDNISONE 20 MG PO TABS: ORAL_TABLET | ORAL | 0 refills | Status: DC

## 2021-02-17 ENCOUNTER — Ambulatory Visit
Admission: RE | Admit: 2021-02-17 | Discharge: 2021-02-17 | Disposition: A | Discharge status: Home / Self Care | Payer: 59 | Source: Ambulatory Visit | Attending: Internal Medicine | Admitting: Internal Medicine

## 2021-02-17 DIAGNOSIS — Z1231 Encounter for screening mammogram for malignant neoplasm of breast: Secondary | ICD-10-CM

## 2021-02-19 ENCOUNTER — Encounter: Payer: Self-pay | Admitting: Adult Health

## 2021-02-20 ENCOUNTER — Other Ambulatory Visit: Payer: Self-pay | Admitting: Adult Health

## 2021-02-23 ENCOUNTER — Other Ambulatory Visit: Payer: Self-pay | Admitting: Adult Health

## 2021-03-17 ENCOUNTER — Other Ambulatory Visit: Payer: Self-pay | Admitting: Adult Health Nurse Practitioner

## 2021-03-17 DIAGNOSIS — J0140 Acute pansinusitis, unspecified: Secondary | ICD-10-CM

## 2021-03-17 DIAGNOSIS — J0410 Acute tracheitis without obstruction: Secondary | ICD-10-CM

## 2021-03-30 ENCOUNTER — Other Ambulatory Visit: Payer: Self-pay | Admitting: Adult Health

## 2021-03-30 ENCOUNTER — Other Ambulatory Visit: Payer: Self-pay | Admitting: Internal Medicine

## 2021-03-30 DIAGNOSIS — E669 Obesity, unspecified: Secondary | ICD-10-CM

## 2021-03-30 DIAGNOSIS — E782 Mixed hyperlipidemia: Secondary | ICD-10-CM

## 2021-03-31 ENCOUNTER — Other Ambulatory Visit: Payer: Self-pay

## 2021-03-31 DIAGNOSIS — E559 Vitamin D deficiency, unspecified: Secondary | ICD-10-CM

## 2021-03-31 MED ORDER — VITAMIN D (ERGOCALCIFEROL) 1.25 MG (50000 UNIT) PO CAPS: ORAL_CAPSULE | ORAL | 3 refills | Status: DC

## 2021-04-08 ENCOUNTER — Encounter: Payer: Self-pay | Admitting: Adult Health

## 2021-04-08 ENCOUNTER — Ambulatory Visit: Payer: 59 | Admitting: Adult Health

## 2021-04-08 ENCOUNTER — Other Ambulatory Visit: Payer: Self-pay

## 2021-04-08 VITALS — BP 128/78 | HR 100 | Temp 97.2°F | Ht 62.0 in | Wt 164.2 lb

## 2021-04-08 DIAGNOSIS — I700 Atherosclerosis of aorta: Secondary | ICD-10-CM

## 2021-04-08 DIAGNOSIS — Z136 Encounter for screening for cardiovascular disorders: Secondary | ICD-10-CM | POA: Diagnosis not present

## 2021-04-08 DIAGNOSIS — Z1329 Encounter for screening for other suspected endocrine disorder: Secondary | ICD-10-CM

## 2021-04-08 DIAGNOSIS — E782 Mixed hyperlipidemia: Secondary | ICD-10-CM

## 2021-04-08 DIAGNOSIS — Z0000 Encounter for general adult medical examination without abnormal findings: Secondary | ICD-10-CM | POA: Diagnosis not present

## 2021-04-08 DIAGNOSIS — Z1389 Encounter for screening for other disorder: Secondary | ICD-10-CM

## 2021-04-08 DIAGNOSIS — N182 Chronic kidney disease, stage 2 (mild): Secondary | ICD-10-CM

## 2021-04-08 DIAGNOSIS — I10 Essential (primary) hypertension: Secondary | ICD-10-CM | POA: Diagnosis not present

## 2021-04-08 DIAGNOSIS — Z131 Encounter for screening for diabetes mellitus: Secondary | ICD-10-CM

## 2021-04-08 DIAGNOSIS — J45909 Unspecified asthma, uncomplicated: Secondary | ICD-10-CM

## 2021-04-08 DIAGNOSIS — R7309 Other abnormal glucose: Secondary | ICD-10-CM

## 2021-04-08 DIAGNOSIS — K219 Gastro-esophageal reflux disease without esophagitis: Secondary | ICD-10-CM

## 2021-04-08 DIAGNOSIS — E663 Overweight: Secondary | ICD-10-CM

## 2021-04-08 DIAGNOSIS — E559 Vitamin D deficiency, unspecified: Secondary | ICD-10-CM

## 2021-04-08 DIAGNOSIS — F325 Major depressive disorder, single episode, in full remission: Secondary | ICD-10-CM

## 2021-04-08 MED ORDER — BECLOMETHASONE DIPROP HFA 80 MCG/ACT IN AERB: 1.0000 | INHALATION_SPRAY | Freq: Two times a day (BID) | RESPIRATORY_TRACT | 5 refills | Status: DC
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
## 2021-04-08 NOTE — Patient Instructions (Signed)
Ms. Erin Nichols , Thank you for taking time to come for your Annual Wellness Visit. I appreciate your ongoing commitment to your health goals. Please review the following plan we discussed and let me know if I can assist you in the future.   These are the goals we discussed:  Goals      Blood Pressure < 130/80     DIET - INCREASE WATER INTAKE     65-80+ fluid ounces      Exercise 150 min/wk Moderate Activity     Weight (lb) < 150 lb (68 kg)        This is a list of the screening recommended for you and due dates:  Health Maintenance  Topic Date Due   Zoster (Shingles) Vaccine (1 of 2) Never done   COVID-19 Vaccine (4 - Booster for Pfizer series) 02/07/2020   Mammogram  02/17/2022   Tetanus Vaccine  02/21/2022   Pap Smear  07/10/2023   Colon Cancer Screening  01/01/2025   Flu Shot  Completed   Hepatitis C Screening: USPSTF Recommendation to screen - Ages 18-79 yo.  Completed   HPV Vaccine  Aged Out   HIV Screening  Discontinued      Know what a healthy weight is for you (roughly BMI <25) and aim to maintain this  Aim for 7+ servings of fruits and vegetables daily  65-80+ fluid ounces of water or unsweet tea for healthy kidneys  Limit to max 1 drink of alcohol per day; avoid smoking/tobacco  Limit animal fats in diet for cholesterol and heart health - choose grass fed whenever available  Avoid highly processed foods, and foods high in saturated/trans fats  Aim for low stress - take time to unwind and care for your mental health  Aim for 150 min of moderate intensity exercise weekly for heart health, and weights twice weekly for bone health  Aim for 7-9 hours of sleep daily      Zoster Vaccine, Recombinant injection What is this medication? ZOSTER VACCINE (ZOS ter vak SEEN) is a vaccine used to reduce the risk of getting shingles. This vaccine is not used to treat shingles or nerve pain from shingles. This medicine may be used for other purposes; ask your health  care provider or pharmacist if you have questions. COMMON BRAND NAME(S): [facility] What should I tell my care team before I take this medication? They need to know if you have any of these conditions: cancer immune system problems an unusual or allergic reaction to Zoster vaccine, other medications, foods, dyes, or preservatives pregnant or trying to get pregnant breast-feeding How should I use this medication? This vaccine is injected into a muscle. It is given by a health care provider. A copy of Vaccine Information Statements will be given before each vaccination. Be sure to read this information carefully each time. This sheet may change often. Talk to your health care provider about the use of this vaccine in children. This vaccine is not approved for use in children. Overdosage: If you think you have taken too much of this medicine contact a poison control center or emergency room at once. NOTE: This medicine is only for you. Do not share this medicine with others. What if I miss a dose? Keep appointments for follow-up (booster) doses. It is important not to miss your dose. Call your health care provider if you are unable to keep an appointment. What may interact with this medication? medicines that suppress your immune system medicines to  treat cancer steroid medicines like prednisone or cortisone This list may not describe all possible interactions. Give your health care provider a list of all the medicines, herbs, non-prescription drugs, or dietary supplements you use. Also tell them if you smoke, drink alcohol, or use illegal drugs. Some items may interact with your medicine. What should I watch for while using this medication? Visit your health care provider regularly. This vaccine, like all vaccines, may not fully protect everyone. What side effects may I notice from receiving this medication? Side effects that you should report to your doctor or health care professional as soon  as possible: allergic reactions (skin rash, itching or hives; swelling of the face, lips, or tongue) trouble breathing Side effects that usually do not require medical attention (report these to your doctor or health care professional if they continue or are bothersome): chills headache fever nausea pain, redness, or irritation at site where injected tiredness vomiting This list may not describe all possible side effects. Call your doctor for medical advice about side effects. You may report side effects to FDA at 1-800-FDA-1088. Where should I keep my medication? This vaccine is only given by a health care provider. It will not be stored at home. NOTE: This sheet is a summary. It may not cover all possible information. If you have questions about this medicine, talk to your doctor, pharmacist, or health care provider.  2022 Elsevier/Gold Standard (2020-10-27 00:00:00)

## 2021-04-09 ENCOUNTER — Other Ambulatory Visit: Payer: Self-pay | Admitting: Adult Health

## 2021-04-09 ENCOUNTER — Telehealth: Payer: Self-pay

## 2021-04-09 ENCOUNTER — Encounter: Payer: Self-pay | Admitting: Adult Health

## 2021-04-09 DIAGNOSIS — J45909 Unspecified asthma, uncomplicated: Secondary | ICD-10-CM

## 2021-04-09 LAB — URINALYSIS, ROUTINE W REFLEX MICROSCOPIC
Bacteria, UA: NONE SEEN /HPF
Bilirubin Urine: NEGATIVE
Glucose, UA: NEGATIVE
Hgb urine dipstick: NEGATIVE
Hyaline Cast: NONE SEEN /LPF
Ketones, ur: NEGATIVE
Nitrite: NEGATIVE
Protein, ur: NEGATIVE
RBC / HPF: NONE SEEN /HPF (ref 0–2)
Specific Gravity, Urine: 1.014 (ref 1.001–1.035)
Squamous Epithelial / HPF: NONE SEEN /HPF (ref <=5)
pH: 5.5 (ref 5.0–8.0)

## 2021-04-09 LAB — COMPLETE METABOLIC PANEL WITH GFR
AG Ratio: 1.6 (calc) (ref 1.0–2.5)
ALT: 12 U/L (ref 6–29)
AST: 15 U/L (ref 10–35)
Albumin: 4.4 g/dL (ref 3.6–5.1)
Alkaline phosphatase (APISO): 132 U/L (ref 37–153)
BUN/Creatinine Ratio: 13 (calc) (ref 6–22)
BUN: 13 mg/dL (ref 7–25)
CO2: 24 mmol/L (ref 20–32)
Calcium: 9.6 mg/dL (ref 8.6–10.4)
Chloride: 106 mmol/L (ref 98–110)
Creat: 1.04 mg/dL — ABNORMAL HIGH (ref 0.50–1.03)
Globulin: 2.8 g/dL (calc) (ref 1.9–3.7)
Glucose, Bld: 84 mg/dL (ref 65–99)
Potassium: 3.9 mmol/L (ref 3.5–5.3)
Sodium: 141 mmol/L (ref 135–146)
Total Bilirubin: 0.4 mg/dL (ref 0.2–1.2)
Total Protein: 7.2 g/dL (ref 6.1–8.1)
eGFR: 63 mL/min/{1.73_m2} (ref >=60)

## 2021-04-09 LAB — LIPID PANEL
Cholesterol: 139 mg/dL (ref <200)
HDL: 44 mg/dL — ABNORMAL LOW (ref >=50)
LDL Cholesterol (Calc): 73 mg/dL (calc)
Non-HDL Cholesterol (Calc): 95 mg/dL (calc) (ref <130)
Total CHOL/HDL Ratio: 3.2 (calc) (ref <5.0)
Triglycerides: 137 mg/dL (ref <150)

## 2021-04-09 LAB — HEMOGLOBIN A1C
Hgb A1c MFr Bld: 5.2 % of total Hgb (ref <5.7)
Mean Plasma Glucose: 103 mg/dL
eAG (mmol/L): 5.7 mmol/L

## 2021-04-09 LAB — CBC WITH DIFFERENTIAL/PLATELET
Absolute Monocytes: 410 cells/uL (ref 200–950)
Basophils Absolute: 59 cells/uL (ref 0–200)
Basophils Relative: 0.9 %
Eosinophils Absolute: 202 cells/uL (ref 15–500)
Eosinophils Relative: 3.1 %
HCT: 39.4 % (ref 35.0–45.0)
Hemoglobin: 13 g/dL (ref 11.7–15.5)
Lymphs Abs: 1950 cells/uL (ref 850–3900)
MCH: 30.3 pg (ref 27.0–33.0)
MCHC: 33 g/dL (ref 32.0–36.0)
MCV: 91.8 fL (ref 80.0–100.0)
MPV: 11.2 fL (ref 7.5–12.5)
Monocytes Relative: 6.3 %
Neutro Abs: 3881 cells/uL (ref 1500–7800)
Neutrophils Relative %: 59.7 %
Platelets: 243 10*3/uL (ref 140–400)
RBC: 4.29 10*6/uL (ref 3.80–5.10)
RDW: 13 % (ref 11.0–15.0)
Total Lymphocyte: 30 %
WBC: 6.5 10*3/uL (ref 3.8–10.8)

## 2021-04-09 LAB — MICROALBUMIN / CREATININE URINE RATIO
Creatinine, Urine: 88 mg/dL (ref 20–275)
Microalb, Ur: <0.2 mg/dL

## 2021-04-09 LAB — VITAMIN D 25 HYDROXY (VIT D DEFICIENCY, FRACTURES): Vit D, 25-Hydroxy: 65 ng/mL (ref 30–100)

## 2021-04-09 LAB — MAGNESIUM: Magnesium: 2.1 mg/dL (ref 1.5–2.5)

## 2021-04-09 LAB — MICROSCOPIC MESSAGE

## 2021-04-09 LAB — TSH: TSH: 1.97 mIU/L (ref 0.40–4.50)

## 2021-04-09 MED ORDER — ARNUITY ELLIPTA 100 MCG/ACT IN AEPB: INHALATION_SPRAY | RESPIRATORY_TRACT | 11 refills | Status: DC

## 2021-04-09 NOTE — Telephone Encounter (Signed)
Insurance is requiring an alternative to Qvar. Pharmacy states that the following are on formulary: Annuity Ellipta, Flovent HFA & Diskus, Pulmicort Flexhaler.

## 2021-04-26 ENCOUNTER — Other Ambulatory Visit: Payer: Self-pay | Admitting: Adult Health

## 2021-04-26 DIAGNOSIS — J0140 Acute pansinusitis, unspecified: Secondary | ICD-10-CM

## 2021-04-26 DIAGNOSIS — J0410 Acute tracheitis without obstruction: Secondary | ICD-10-CM

## 2021-04-28 ENCOUNTER — Encounter: Payer: Self-pay | Admitting: Adult Health

## 2021-04-28 ENCOUNTER — Other Ambulatory Visit: Payer: Self-pay | Admitting: Adult Health

## 2021-04-28 MED ORDER — PROMETHAZINE-DM 6.25-15 MG/5ML PO SYRP: 5.0000 mL | ORAL_SOLUTION | Freq: Four times a day (QID) | ORAL | 1 refills | Status: DC | PRN

## 2021-07-13 ENCOUNTER — Other Ambulatory Visit: Payer: Self-pay | Admitting: Adult Health

## 2021-07-19 ENCOUNTER — Encounter: Payer: Self-pay | Admitting: Adult Health

## 2021-07-20 NOTE — Progress Notes (Unsigned)
Assessment and Plan: Erin Nichols was seen today for acute visit.  Diagnoses and all orders for this visit:  Essential hypertension - continue DASH diet, exercise and monitor at home. Call if greater than 130/80.    Pain of toe of right foot Will get xray, continue Ibuprofen/tylenol,  Will refer to ortho if fracture is still present -     DG Foot Complete Right; Future       Further disposition pending results of labs. Discussed med's effects and SE's.   Over 30 minutes of exam, counseling, chart review, and critical decision making was performed.   Future Appointments  Date Time Provider [place]  10/07/2021  3:30 PM Magda Bernheim, NP [facility] None  04/12/2022  3:00 PM [name], NP [facility] None    ------------------------------------------------------------------------------------------------------------------   HPI BP 108/74   Pulse 99   Temp (!) 97.5 F (36.4 C)   Wt 161 lb 12.8 oz (73.4 kg)   SpO2 95%   BMI 29.59 kg/m   56 y.o.female presents for evaluation of swollen 3rd toe on right foot  She hit her foot on a desk 05/10/21 and developed pain and swelling. It has not  improved at all since that time.  She has a previous history of nonunion of fractures.  She had no imaging done of this toe.  She has persistent swelling of 3rd right toe and inability to bend this toe.  She has been alternating Advil and Tylenol.  BP is currently well controlled without medications. 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   atorvastatin (LIPITOR) 20 MG tablet TAKE 1 TABLET BY MOUTH DAILY FOR CHOLESTEROL   buPROPion (WELLBUTRIN XL) 300 MG 24 hr tablet Take 1 tablet every morning for Mood and Focus and Concentration   calcium citrate (CALCITRATE - DOSED IN MG ELEMENTAL CALCIUM) 950 MG tablet Take 200 mg of elemental calcium by mouth daily.   citalopram (CELEXA) 40 MG tablet Take 1 tablet Daily for Anxiety & Mood   cyclobenzaprine (FLEXERIL) 10 MG tablet TAKE 1/2 TO 1 TABLET BY MOUTH 2 TO 3 TIMES PER DAY AS NEEDED FOR MUSCLE SPASM   Fexofenadine HCl (ALLEGRA PO) Take by mouth.   Multiple Vitamins-Minerals (MULTIVITAMIN ADULTS PO) Take by mouth daily.   phentermine (ADIPEX-P) 37.5 MG tablet Take 1 tablet every Morning for Dieting & Weight Loss                                                     /  TAKE   BY MOUTH   promethazine-dextromethorphan (PROMETHAZINE-DM) 6.25-15 MG/5ML syrup TAKE 5 ML BY MOUTH FOUR TIMES DAILY AS NEEDED FOR COUGH   topiramate (TOPAMAX) 50 MG tablet Take 1/2 to 1 tablet 2 x /day at Suppertime & Bedtime for Dieting & Weight Loss   vitamin B-12 (CYANOCOBALAMIN) 100 MCG tablet Take 100 mcg by mouth daily.    Vitamin D, Ergocalciferol, (DRISDOL) 1.25 MG (50000 UNIT) CAPS capsule Take     1 capsule     every 7 days       for severe Vit D Deficiency   Fluticasone Furoate (ARNUITY ELLIPTA) 100 MCG/ACT AEPB Take 1 deep puff daily and hold for 15 sec for asthma. Brush teeth and gargle after each use. (Patient not taking: Reported on 07/21/2021)   No current facility-administered medications on file prior to visit.    ROS: all negative except above.   Physical Exam:  BP 108/74   Pulse 99   Temp (!) 97.5 F (36.4 C)   Wt 161 lb 12.8 oz (73.4 kg)   SpO2 95%   BMI 29.59 kg/m    General Appearance: Well nourished, in no apparent distress. Eyes: PERRLA, EOMs, conjunctiva no swelling or erythema Sinuses: No Frontal/maxillary tenderness ENT/Mouth: Ext aud canals clear, TMs without erythema, bulging. No erythema, swelling, or exudate on post pharynx.  Tonsils not swollen or erythematous. Hearing normal.  Neck: Supple, thyroid normal.  Respiratory: Respiratory effort normal, BS equal bilaterally without rales, rhonchi, wheezing or stridor.  Cardio: RRR with no MRGs. Brisk peripheral pulses without edema.  Abdomen: Soft, + BS.  Non tender, no guarding, rebound, hernias, masses. Lymphatics: Non tender without lymphadenopathy.  Musculoskeletal: Full ROM,3rd toe right is swollend and does not bend, mild purple discoloration to the toe Skin: Warm, dry without rashes, lesions, ecchymosis.  Neuro: Cranial nerves intact. Normal muscle tone, no cerebellar symptoms. Sensation intact.  Psych: Awake and oriented X 3, normal affect, Insight and Judgment appropriate.     Magda Bernheim, NP 3:37 PM [facility]

## 2021-07-21 ENCOUNTER — Encounter: Payer: Self-pay | Admitting: Nurse Practitioner

## 2021-07-21 ENCOUNTER — Ambulatory Visit: Payer: 59 | Admitting: Nurse Practitioner

## 2021-07-21 VITALS — BP 108/74 | HR 99 | Temp 97.5°F | Wt 161.8 lb

## 2021-07-21 DIAGNOSIS — M79674 Pain in right toe(s): Secondary | ICD-10-CM

## 2021-07-21 DIAGNOSIS — I10 Essential (primary) hypertension: Secondary | ICD-10-CM | POA: Diagnosis not present

## 2021-07-21 NOTE — Patient Instructions (Signed)
[facility] [place] 8:30-3:45 M-F  Toe Fracture A toe fracture is a break in one of the toe bones (phalanges). A toe fracture may be: A crack in the surface of the bone (stress fracture). This often occurs in athletes. A break all the way through the bone (complete fracture). What are the causes? This condition may be caused by: Direct impact, such as from dropping a heavy object on your toe. Stubbing your toe. Twisting or stretching your toe out of place. Overuse or repetitive exercise. What increases the risk? You are more likely to develop this condition if you: Play contact sports. Have a condition that causes the bones to become thin and brittle (osteoporosis). Have a low calcium level. What are the signs or symptoms? The main symptoms of this condition are swelling and pain in the toe. Other symptoms include: Bruising. Stiffness. Numbness. A change in the way the toe looks. Broken bones that poke through the skin. Blood beneath the toenail. How is this diagnosed? This condition is diagnosed with a physical exam. You may also have X-rays. How is this treated? Treatment for this condition depends on the type of fracture and its severity. Treatment may include: Taping the broken toe to a toe that is next to it (buddy taping). This is the most common treatment for fractures in which the bone has not moved out of place (non-displaced fracture). Wearing a shoe that has a wide, rigid sole to protect the toe and to limit its movement. Wearing a walking cast. Having a procedure to move the toe back into place. Surgery. This may be needed if the: Pieces of broken bone are out of place (displaced). Bone breaks through the skin. Physical therapy. This is done to help regain movement and strength in the toe. You may need follow-up X-rays to make sure that the bone is healing well and staying in position. Follow these instructions at home: If you have a  shoe: Wear the shoe as told by your health care provider. Remove it only as told by your health care provider. Loosen the shoe if your toes tingle, become numb, or turn cold and blue. Keep the shoe clean and dry. If you have a cast: Do not put pressure on any part of the cast until it is fully hardened. This may take several hours. Do not stick anything inside the cast to scratch your skin. Doing that increases your risk of infection. Check the skin around the cast every day. Tell your health care provider about any concerns. You may put lotion on dry skin around the edges of the cast. Do not put lotion on the skin underneath the cast. Keep the cast clean and dry. Bathing Do not take baths, swim, or use a hot tub until your health care provider approves. Ask your health care provider if you can take showers. If the shoe or cast is not waterproof: Do not let it get wet. Cover it with a watertight covering when you take a bath or a shower. Activity Do not use the injured foot to support your body weight until your health care provider says that you can. Use crutches as directed. Ask your health care provider: What activities are safe for you during recovery. What activities you need to avoid. Do physical therapy exercises as directed. Driving Do not drive or use heavy machinery while taking pain medicine. Do not drive while wearing a cast on a foot that you use for driving. Managing pain, stiffness,  and swelling  If directed, put ice on painful areas: Put ice in a plastic bag. Place a towel between your skin and the bag. If you have a shoe, remove it as told by your health care provider. If you have a cast, place a towel between your cast and the bag. Leave the ice on for 20 minutes, 2-3 times per day. Raise (elevate) the injured area above the level of your heart while you are sitting or lying down. General instructions If your toe was treated with buddy taping, follow your health  care provider's instructions for changing the gauze and tape. Change it more often if: The gauze and tape get wet. If this happens, dry the space between the toes. The gauze and tape are too tight and cause your toe to become pale or numb. If you were not given a protective shoe, wear sturdy, supportive shoes. Your shoes should not pinch your toes and should not fit tightly against your toes. Do not use any products that contain nicotine or tobacco, such as cigarettes and e-cigarettes. These can delay bone healing. If you need help quitting, ask your health care provider. Take over-the-counter and prescription medicines only as told by your health care provider. Keep all follow-up visits as told by your health care provider. This is important. Contact a health care provider if you have: Pain that gets worse or does not get better with medicine. A fever. A bad smell coming from your cast. Get help right away if you have: Any of the following in your toes or your foot: Numbness that gets worse. Tingling. Coldness. Blue skin. Redness or swelling that gets worse. Pain that suddenly becomes severe. Summary A toe fracture is a break in one of the toe bones (phalanges). Treatment depends on how severe your fracture is and how the pieces of the broken bone line up with each other. Treatment may include buddy taping, wearing a shoe or a cast, or using crutches. Ice and elevate your foot to help lessen the pain and swelling. This information is not intended to replace advice given to you by your health care provider. Make sure you discuss any questions you have with your health care provider. Document Revised: 07/13/2020 Document Reviewed: 07/13/2020 Elsevier Patient Education  [place].

## 2021-07-22 ENCOUNTER — Ambulatory Visit
Admission: RE | Admit: 2021-07-22 | Discharge: 2021-07-22 | Disposition: A | Discharge status: Home / Self Care | Payer: 59 | Source: Ambulatory Visit | Attending: Nurse Practitioner | Admitting: Nurse Practitioner

## 2021-07-22 DIAGNOSIS — M79674 Pain in right toe(s): Secondary | ICD-10-CM

## 2021-07-23 ENCOUNTER — Other Ambulatory Visit: Payer: Self-pay | Admitting: Nurse Practitioner

## 2021-07-23 DIAGNOSIS — M79674 Pain in right toe(s): Secondary | ICD-10-CM

## 2021-08-02 ENCOUNTER — Ambulatory Visit: Payer: 59 | Admitting: Orthopedic Surgery

## 2021-08-02 DIAGNOSIS — M25571 Pain in right ankle and joints of right foot: Secondary | ICD-10-CM | POA: Diagnosis not present

## 2021-08-12 ENCOUNTER — Encounter: Payer: Self-pay | Admitting: Orthopedic Surgery

## 2021-08-14 ENCOUNTER — Other Ambulatory Visit: Payer: Self-pay | Admitting: Adult Health

## 2021-08-14 ENCOUNTER — Other Ambulatory Visit: Payer: Self-pay | Admitting: Internal Medicine

## 2021-08-14 DIAGNOSIS — E669 Obesity, unspecified: Secondary | ICD-10-CM

## 2021-08-14 DIAGNOSIS — F32A Depression, unspecified: Secondary | ICD-10-CM

## 2021-08-14 MED ORDER — TOPIRAMATE 50 MG PO TABS: ORAL_TABLET | ORAL | 3 refills | Status: DC

## 2021-08-31 ENCOUNTER — Other Ambulatory Visit: Payer: Self-pay | Admitting: Internal Medicine

## 2021-10-05 ENCOUNTER — Other Ambulatory Visit: Payer: Self-pay | Admitting: Internal Medicine

## 2021-10-06 ENCOUNTER — Other Ambulatory Visit: Payer: Self-pay

## 2021-10-06 DIAGNOSIS — F32A Depression, unspecified: Secondary | ICD-10-CM

## 2021-10-06 MED ORDER — CITALOPRAM HYDROBROMIDE 40 MG PO TABS: ORAL_TABLET | ORAL | 3 refills | Status: DC

## 2021-10-06 NOTE — Progress Notes (Unsigned)
FOLLOW UP  Assessment and Plan:   Aortic Atherosclerosis Control blood pressure, weight, blood sugars and cholesterol  Hypertension Well controlled off of meds at this time Monitor blood pressure at home; patient to call if consistently greater than 130/80 Continue DASH diet.   Reminder to go to the ER if any CP, SOB, nausea, dizziness, severe HA, changes vision/speech, left arm numbness and tingling and jaw pain.  Cholesterol At goal on statin; continue medication  Continue low cholesterol diet and exercise.  Check lipid panel.   Other abnormal glucose Discussed risks associated with elevated glucose Recommend prudent diet, portion control, maintenance of weight in normal range, regular aerobic exercise Defer A1C; check annually - monitor sugars by CMP  GERD Off of meds; reports well controlled sx Discussed diet, avoiding triggers and other lifestyle changes  Overweight Long discussion about weight loss, diet, and exercise Recommended diet heavy in fruits and veggies and low in animal meats, cheeses, and dairy products, appropriate calorie intake Discussed appropriate weight for height Patient on phentermine/topamax with benefit and no SE, taking drug breaks; continue close follow up.  Discussed adding exercise focusing on muscle building, reduce processed carbohydrate, increase protein, fresh fruits/veggies, nuts/seeds, etc Follow up in 3 months  Depression/anxiety She reports fairly controlled by current regimen; discussed risk of SS with current agents; using buspar PRN only/occasionally; continue to monitor closely  Lifestyle discussed: diet/exerise, sleep hygiene, stress management, hydration  Vitamin D Def/ osteoporosis prevention At goal at recent check; continue supplement for goal range 60-100 Defer vitamin D level    Continue diet and meds as discussed. Further disposition pending results of labs. Discussed med's effects and SE's.   Over 30 minutes of exam,  counseling, chart review, and critical decision making was performed.   Future Appointments  Date Time Provider [place]  10/07/2021  3:30 PM Alycia Rossetti, NP [facility] None  04/12/2022  3:00 PM Darrol Jump, NP [facility] None    ----------------------------------------------------------------------------------------------------------------------  HPI 56 y.o. female  presents for 3 month follow up on hypertension, cholesterol, GERD, blood sugar monitoring, weight, depression and vitamin D deficiency.   Overdue PAP planned with OV today. Denies hx of abnormal -   she is prescribed phentermine and topamax for weight loss, was off recently, just restarted.  While on the medication they have lost 0 lbs since last visit. They deny palpitations, anxiety, trouble sleeping, elevated BP.   BMI is There is no height or weight on file to calculate BMI., she has been working on diet, no particular weight goal, generally trying to eat better, trying to move more, doing single portion nuts as snack, etc.   Wt Readings from Last 3 Encounters:  07/21/21 161 lb 12.8 oz (73.4 kg)  04/08/21 164 lb 3.2 oz (74.5 kg)  01/08/21 170 lb (77.1 kg)   she has ongoing depression with anxious features and is currently on celexa 40 mg daily, wellbutrin XR 300 mg daily, and she is on buspar '15mg'$  PRN occasionally, does not benefit when needed.   she has a diagnosis of GERD which improved on nexium 40 mg, was able to taper off without recurrent symptoms.   Her blood pressure has been controlled at home, today their BP is    She does workout. She denies chest pain, shortness of breath, dizziness.   She is on cholesterol medication (atorvastatin 20 mg daily) and denies myalgias. Her cholesterol is not at goal. The cholesterol last visit was:   Lab Results  Component Value  Date   CHOL 139 04/08/2021   HDL 44 (L) 04/08/2021   LDLCALC 73 04/08/2021   TRIG 137 04/08/2021   CHOLHDL 3.2 04/08/2021     She has been working on diet and exercise for blood sugar control, and denies increased appetite, nausea, paresthesia of the feet, polydipsia, polyuria, visual disturbances and vomiting. Last A1C in the office was:  Lab Results  Component Value Date   HGBA1C 5.2 04/08/2021   Patient is on Vitamin D supplement  Lab Results  Component Value Date   VD25OH 65 04/08/2021      Current Medications:  Current Outpatient Medications on File Prior to Visit  Medication Sig   albuterol (PROAIR HFA) 108 (90 Base) MCG/ACT inhaler Use  2 inhalations  15 minutes  apart with a spacer every 4 hours as Needed to Rescue Asthma   aspirin 81 MG chewable tablet Chew by mouth daily.   atorvastatin (LIPITOR) 20 MG tablet TAKE 1 TABLET BY MOUTH DAILY FOR CHOLESTEROL   buPROPion (WELLBUTRIN XL) 300 MG 24 hr tablet TAKE 1 TABLET BY MOUTH EVERY MORNING FOR MOOD OR FOCUS OR CONCENTRATION   calcium citrate (CALCITRATE - DOSED IN MG ELEMENTAL CALCIUM) 950 MG tablet Take 200 mg of elemental calcium by mouth daily.   citalopram (CELEXA) 40 MG tablet Take 1 tablet Daily for Anxiety & Mood   cyclobenzaprine (FLEXERIL) 10 MG tablet TAKE 1/2 TO 1 TABLET BY MOUTH 2 TO 3 TIMES PER DAY AS NEEDED FOR MUSCLE SPASM   Fexofenadine HCl (ALLEGRA PO) Take by mouth.   Multiple Vitamins-Minerals (MULTIVITAMIN ADULTS PO) Take by mouth daily.   phentermine (ADIPEX-P) 37.5 MG tablet Take 1 tablet every Morning for Dieting & Weight Loss                                                     /                             TAKE   BY MOUTH   promethazine-dextromethorphan (PROMETHAZINE-DM) 6.25-15 MG/5ML syrup TAKE 5 ML BY MOUTH FOUR TIMES DAILY AS NEEDED FOR COUGH   topiramate (TOPAMAX) 50 MG tablet Take 1/2 to 1 tablet 2 x /day at Suppertime & Bedtime for Dieting & Weight Loss             /            TAKE                         BY                    MOUTH   vitamin B-12 (CYANOCOBALAMIN) 100 MCG tablet Take 100 mcg by mouth daily.    Vitamin D,  Ergocalciferol, (DRISDOL) 1.25 MG (50000 UNIT) CAPS capsule Take     1 capsule     every 7 days       for severe Vit D Deficiency   No current facility-administered medications on file prior to visit.     Allergies: No Known Allergies   Medical History:  Past Medical History:  Diagnosis Date   Allergy    Asthma    Depression    GERD (gastroesophageal reflux disease) 2005   High frequency hearing  loss of both ears    Hyperlipidemia 2012   Hypertension 2012   Vitamin D deficiency    Family history- Reviewed and unchanged Social history- Reviewed and unchanged   Review of Systems:  Review of Systems  Constitutional:  Negative for malaise/fatigue and weight loss.  HENT:  Negative for hearing loss and tinnitus.   Eyes:  Negative for blurred vision and double vision.  Respiratory:  Negative for cough, shortness of breath and wheezing.   Cardiovascular:  Negative for chest pain, palpitations, orthopnea, claudication and leg swelling.  Gastrointestinal:  Negative for abdominal pain, blood in stool, constipation, diarrhea, heartburn, melena, nausea and vomiting.  Genitourinary: Negative.   Musculoskeletal:  Negative for joint pain and myalgias.  Skin:  Negative for rash.  Neurological:  Negative for dizziness, tingling, sensory change, weakness and headaches.  Endo/Heme/Allergies:  Negative for polydipsia.  Psychiatric/Behavioral:  Negative for depression and substance abuse. The patient is nervous/anxious (chronic since childhood, improved with medication).   All other systems reviewed and are negative.    Physical Exam: There were no vitals taken for this visit. Wt Readings from Last 3 Encounters:  07/21/21 161 lb 12.8 oz (73.4 kg)  04/08/21 164 lb 3.2 oz (74.5 kg)  01/08/21 170 lb (77.1 kg)   General Appearance: Well nourished, in no apparent distress. Eyes: PERRLA, EOMs, conjunctiva no swelling or erythema Sinuses: No Frontal/maxillary tenderness ENT/Mouth: Ext aud  canals clear, TMs without erythema, bulging. No erythema, swelling, or exudate on post pharynx.  Tonsils not swollen or erythematous. Hearing normal.  Neck: Supple, thyroid normal.  Respiratory: Respiratory effort normal, BS equal bilaterally without rales, rhonchi, wheezing or stridor.  Cardio: RRR with no MRGs. Brisk peripheral pulses without edema.  Abdomen: Soft, + BS.  Non tender, no guarding, rebound, hernias, masses. Lymphatics: Non tender without lymphadenopathy.  Musculoskeletal: Full ROM, 5/5 strength, Normal gait Skin: Warm, dry without rashes, lesions, ecchymosis.  Neuro: Cranial nerves intact. No cerebellar symptoms.  Psych: Awake and oriented X 3, mildly anxious affect, Insight and Judgment appropriate.  GU: Female genitalia: normal external genitalia, vulva, vagina, cervix, uterus and adnexa Chaperoned by Chancy Hurter, CMA   Alycia Rossetti, NP 12:52 PM [facility]

## 2021-10-07 ENCOUNTER — Encounter: Payer: Self-pay | Admitting: Nurse Practitioner

## 2021-10-07 ENCOUNTER — Ambulatory Visit: Payer: 59 | Admitting: Nurse Practitioner

## 2021-10-07 VITALS — BP 110/72 | HR 95 | Temp 97.5°F | Ht 62.0 in | Wt 167.6 lb

## 2021-10-07 DIAGNOSIS — F325 Major depressive disorder, single episode, in full remission: Secondary | ICD-10-CM

## 2021-10-07 DIAGNOSIS — E559 Vitamin D deficiency, unspecified: Secondary | ICD-10-CM

## 2021-10-07 DIAGNOSIS — K219 Gastro-esophageal reflux disease without esophagitis: Secondary | ICD-10-CM

## 2021-10-07 DIAGNOSIS — E782 Mixed hyperlipidemia: Secondary | ICD-10-CM | POA: Diagnosis not present

## 2021-10-07 DIAGNOSIS — E2839 Other primary ovarian failure: Secondary | ICD-10-CM

## 2021-10-07 DIAGNOSIS — I10 Essential (primary) hypertension: Secondary | ICD-10-CM | POA: Diagnosis not present

## 2021-10-07 DIAGNOSIS — R7309 Other abnormal glucose: Secondary | ICD-10-CM

## 2021-10-07 DIAGNOSIS — E663 Overweight: Secondary | ICD-10-CM

## 2021-10-07 DIAGNOSIS — T07XXXA Unspecified multiple injuries, initial encounter: Secondary | ICD-10-CM

## 2021-10-07 DIAGNOSIS — I700 Atherosclerosis of aorta: Secondary | ICD-10-CM

## 2021-10-07 DIAGNOSIS — F419 Anxiety disorder, unspecified: Secondary | ICD-10-CM

## 2021-10-07 MED ORDER — PHENDIMETRAZINE TARTRATE 35 MG PO TABS: 1.0000 | ORAL_TABLET | Freq: Every day | ORAL | 0 refills | Status: DC

## 2021-10-07 MED ORDER — ESTROGENS CONJUGATED 0.625 MG/GM VA CREA: TOPICAL_CREAM | VAGINAL | 12 refills | Status: DC

## 2021-10-07 NOTE — Patient Instructions (Signed)
Sent in [place] order to [facility]- they will be calling you to schedule- continue Calcium, Vitamin D and weight bearing exercises  Premarin Vaginal cream 1 applicator to vagina nightly x 2 weeks and then 3 times a week  Conjugated Estrogens Vaginal Cream What is this medication? CONJUGATED ESTROGENS (CON ju gate ed ESS troe jenz) relieves the symptoms of menopause, such as vaginal irritation, dryness, or pain during sex. It works by increasing levels of the hormone estrogen in the body. This medication is an estrogen hormone. This medicine may be used for other purposes; ask your health care provider or pharmacist if you have questions. COMMON BRAND NAME(S): Premarin What should I tell my care team before I take this medication? They need to know if you have any of these conditions: Abnormal vaginal bleeding Blood vessel disease or blood clots Breast, cervical, endometrial, or uterine cancer Dementia Diabetes Gallbladder disease Heart disease or recent heart attack High blood pressure High cholesterol High level of calcium in the blood Hysterectomy Kidney disease Liver disease Migraine headaches Protein C deficiency Protein S deficiency Stroke Systemic lupus erythematosus (SLE) Tobacco use An unusual or allergic reaction to estrogens other medications, foods, dyes, or preservatives Pregnant or trying to get pregnant Breast-feeding How should I use this medication? This medication is for use in the vagina only. Do not take by mouth. Follow the directions on the prescription label. Use at bedtime unless otherwise directed by your care team. Use the special applicator supplied with the cream. Wash hands before and after use. Fill the applicator with the cream and remove from the tube. Lie on your back, part and bend your knees. Insert the applicator into the vagina and push the plunger to expel the cream into the vagina. Wash the applicator with warm soapy water and rinse well. Use  exactly as directed for the complete length of time prescribed. Do not stop using except on the advice of your care team. Talk to your care team about the use of this medication in children. Special care may be needed. A patient package insert for the product will be given with each prescription and refill. Read this sheet carefully each time. The sheet may change frequently. Overdosage: If you think you have taken too much of this medicine contact a poison control center or emergency room at once. NOTE: This medicine is only for you. Do not share this medicine with others. What if I miss a dose? If you miss a dose, use it as soon as you can. If it is almost time for your next dose, use only that dose. Do not use double or extra doses. What may interact with this medication? Do not take this medication with any of the following: Aromatase inhibitors, such as aminoglutethimide, anastrozole, exemestane, letrozole, testolactone This medication may also interact with the following: Barbiturates used for inducing sleep or treating seizures Carbamazepine Certain antibiotics used to treat infections Grapefruit juice Medications for fungal infections, such as itraconazole and ketoconazole Raloxifene or tamoxifen Rifabutin Rifampin Rifapentine Ritonavir St. John's Wort Warfarin This list may not describe all possible interactions. Give your health care provider a list of all the medicines, herbs, non-prescription drugs, or dietary supplements you use. Also tell them if you smoke, drink alcohol, or use illegal drugs. Some items may interact with your medicine. What should I watch for while using this medication? Visit your care team for regular checks on your progress. You will need a regular breast and pelvic exam. You should also  discuss the need for regular mammograms with your care team, and follow their guidelines. This medication can make your body retain fluid, making your fingers, hands, or  ankles swell. Your blood pressure can go up. Contact your care team if you feel you are retaining fluid. If you have any reason to think you are pregnant; stop taking this medication at once and contact your care team. Tobacco smoking increases the risk of getting a blood clot or having a stroke, especially if you are more than 58 years old. You are strongly advised not to smoke. If you wear contact lenses and notice visual changes, or if the lenses begin to feel uncomfortable, consult your care team. If you are going to have elective surgery, you may need to stop taking this medication beforehand. Consult your care team for advice prior to scheduling the surgery. What side effects may I notice from receiving this medication? Side effects that you should report to your care team as soon as possible: Allergic reactions or angioedema--skin rash, itching or hives, swelling of the face, eyes, lips, tongue, arms, or legs, trouble swallowing or breathing Blood clot--pain, swelling, or warmth in the leg, shortness of breath, chest pain Breast tissue changes, new lumps, redness, pain, or discharge from the nipple Gallbladder problems--severe stomach pain, nausea, vomiting, fever Heart attack--pain or tightness in the chest, shoulders, arms, or jaw, nausea, shortness of breath, cold or clammy skin, feeling faint or lightheaded Heavy vaginal bleeding Increase in blood pressure Stroke--sudden numbness or weakness of the face, arm, or leg, trouble speaking, confusion, trouble walking, loss of balance or coordination, dizziness, severe headache, change in vision Sudden eye pain or change in vision such as blurry vision, seeing halos around lights, vision loss Unusual vaginal discharge, itching, or odor Side effects that usually do not require medical attention (report to your care team if they continue or are bothersome): Bloating Breast pain or tenderness Dark patches of skin on the face or other sun-exposed  areas Hair loss Irregular menstrual cycles or spotting Nausea Stomach pain Swelling of the ankles, hands, or feet This list may not describe all possible side effects. Call your doctor for medical advice about side effects. You may report side effects to FDA at 1-800-FDA-1088. Where should I keep my medication? Keep out of the reach of children and pets. Store at room temperature between 15 and 30 degrees C (59 and 86 degrees F). Throw away any unused medication after the expiration date. NOTE: This sheet is a summary. It may not cover all possible information. If you have questions about this medicine, talk to your doctor, pharmacist, or health care provider.  2023 Elsevier/Gold Standard (2020-12-28 00:00:00)

## 2021-10-08 ENCOUNTER — Other Ambulatory Visit: Payer: Self-pay | Admitting: Internal Medicine

## 2021-10-08 LAB — CBC WITH DIFFERENTIAL/PLATELET
Absolute Monocytes: 482 cells/uL (ref 200–950)
Basophils Absolute: 67 cells/uL (ref 0–200)
Basophils Relative: 1.1 %
Eosinophils Absolute: 159 cells/uL (ref 15–500)
Eosinophils Relative: 2.6 %
HCT: 40.3 % (ref 35.0–45.0)
Hemoglobin: 13.7 g/dL (ref 11.7–15.5)
Lymphs Abs: 1440 cells/uL (ref 850–3900)
MCH: 31.2 pg (ref 27.0–33.0)
MCHC: 34 g/dL (ref 32.0–36.0)
MCV: 91.8 fL (ref 80.0–100.0)
MPV: 11.3 fL (ref 7.5–12.5)
Monocytes Relative: 7.9 %
Neutro Abs: 3953 cells/uL (ref 1500–7800)
Neutrophils Relative %: 64.8 %
Platelets: 244 10*3/uL (ref 140–400)
RBC: 4.39 10*6/uL (ref 3.80–5.10)
RDW: 12.2 % (ref 11.0–15.0)
Total Lymphocyte: 23.6 %
WBC: 6.1 10*3/uL (ref 3.8–10.8)

## 2021-10-08 LAB — COMPLETE METABOLIC PANEL WITH GFR
AG Ratio: 1.7 (calc) (ref 1.0–2.5)
ALT: 11 U/L (ref 6–29)
AST: 16 U/L (ref 10–35)
Albumin: 4.5 g/dL (ref 3.6–5.1)
Alkaline phosphatase (APISO): 134 U/L (ref 37–153)
BUN/Creatinine Ratio: 15 (calc) (ref 6–22)
BUN: 16 mg/dL (ref 7–25)
CO2: 29 mmol/L (ref 20–32)
Calcium: 9.7 mg/dL (ref 8.6–10.4)
Chloride: 108 mmol/L (ref 98–110)
Creat: 1.08 mg/dL — ABNORMAL HIGH (ref 0.50–1.03)
Globulin: 2.6 g/dL (calc) (ref 1.9–3.7)
Glucose, Bld: 90 mg/dL (ref 65–99)
Potassium: 5 mmol/L (ref 3.5–5.3)
Sodium: 145 mmol/L (ref 135–146)
Total Bilirubin: 0.4 mg/dL (ref 0.2–1.2)
Total Protein: 7.1 g/dL (ref 6.1–8.1)
eGFR: 60 mL/min/{1.73_m2} (ref >=60)

## 2021-10-08 LAB — LIPID PANEL
Cholesterol: 154 mg/dL (ref <200)
HDL: 49 mg/dL — ABNORMAL LOW (ref >=50)
LDL Cholesterol (Calc): 81 mg/dL (calc)
Non-HDL Cholesterol (Calc): 105 mg/dL (calc) (ref <130)
Total CHOL/HDL Ratio: 3.1 (calc) (ref <5.0)
Triglycerides: 143 mg/dL (ref <150)

## 2021-10-08 LAB — HEMOGLOBIN A1C
Hgb A1c MFr Bld: 5 % of total Hgb (ref <5.7)
Mean Plasma Glucose: 97 mg/dL
eAG (mmol/L): 5.4 mmol/L

## 2021-10-11 ENCOUNTER — Other Ambulatory Visit: Payer: Self-pay

## 2021-10-11 DIAGNOSIS — E2839 Other primary ovarian failure: Secondary | ICD-10-CM

## 2021-10-11 MED ORDER — ESTROGENS CONJUGATED 0.625 MG/GM VA CREA: TOPICAL_CREAM | VAGINAL | 12 refills | Status: DC

## 2021-10-12 ENCOUNTER — Encounter: Payer: Self-pay | Admitting: Nurse Practitioner

## 2021-10-12 ENCOUNTER — Other Ambulatory Visit: Payer: Self-pay

## 2021-10-12 ENCOUNTER — Ambulatory Visit: Payer: 59 | Admitting: Nurse Practitioner

## 2021-10-12 VITALS — BP 128/84 | HR 102 | Temp 97.9°F | Resp 16 | Ht 62.0 in | Wt 167.2 lb

## 2021-10-12 DIAGNOSIS — R300 Dysuria: Secondary | ICD-10-CM | POA: Diagnosis not present

## 2021-10-12 DIAGNOSIS — I10 Essential (primary) hypertension: Secondary | ICD-10-CM | POA: Diagnosis not present

## 2021-10-12 DIAGNOSIS — E782 Mixed hyperlipidemia: Secondary | ICD-10-CM

## 2021-10-12 MED ORDER — ATORVASTATIN CALCIUM 20 MG PO TABS: ORAL_TABLET | ORAL | 1 refills | Status: DC

## 2021-10-12 MED ORDER — NITROFURANTOIN MONOHYD MACRO 100 MG PO CAPS: 100.0000 mg | ORAL_CAPSULE | Freq: Two times a day (BID) | ORAL | 0 refills | Status: AC

## 2021-10-12 NOTE — Progress Notes (Signed)
Assessment and Plan:  There are no diagnoses linked to this encounter.  [facility] was seen today for acute visit.  Diagnoses and all orders for this visit:  Dysuria Push fluids If urine results do not support infection will plan CT of kidney to rule out kidney stone -     Urine Culture -     Urinalysis, Routine w reflex microscopic -     nitrofurantoin, macrocrystal-monohydrate, (MACROBID) 100 MG capsule; Take 1 capsule (100 mg total) by mouth 2 (two) times daily for 7 days.  Essential Hypertension - currently controlled without medication, DASH diet, exercise and monitor at home. Call if greater than 130/80.      Further disposition pending results of labs. Discussed med's effects and SE's.   Over 30 minutes of exam, counseling, chart review, and critical decision making was performed.   Future Appointments  Date Time Provider [place]  10/12/2021 11:00 AM Alycia Rossetti, NP [facility] None  04/12/2022  3:00 PM [place], Kenney Houseman, NP [facility] None    ------------------------------------------------------------------------------------------------------------------   HPI BP 128/84   Pulse (!) 102   Temp 97.9 F (36.6 C)   Resp 16   Ht '5\' 2"'$  (1.575 m)   Wt 167 lb 3.2 oz (75.8 kg)   SpO2 98%   BMI 30.58 kg/m   56 y.o.female presents for urinary frequency, scant amount of blood with wiping after urinate.  Mild dysuria. Denies back pain.    BMI is Body mass index is 30.58 kg/m., she has not been working on diet and exercise. Wt Readings from Last 3 Encounters:  10/12/21 167 lb 3.2 oz (75.8 kg)  10/07/21 167 lb 9.6 oz (76 kg)  07/21/21 161 lb 12.8 oz (73.4 kg)      BP is currently well controlled with without medication.  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   atorvastatin (LIPITOR) 20 MG tablet TAKE 1 TABLET BY MOUTH DAILY FOR CHOLESTEROL   buPROPion (WELLBUTRIN XL) 300 MG 24 hr tablet TAKE 1 TABLET BY MOUTH EVERY MORNING FOR MOOD OR FOCUS OR CONCENTRATION   calcium citrate (CALCITRATE - DOSED IN MG ELEMENTAL CALCIUM) 950 MG tablet Take 200 mg of elemental calcium by mouth daily.   citalopram (CELEXA) 40 MG tablet Take 1 tablet Daily for Anxiety & Mood   conjugated estrogens (PREMARIN) vaginal cream Insert 1/2 gram once nightly for 14 days, then 1/2 gram three nights a week   cyclobenzaprine (FLEXERIL) 10 MG tablet TAKE 1/2 TO 1 TABLET BY MOUTH 2 TO 3 TIMES PER DAY AS NEEDED FOR MUSCLE SPASM   Fexofenadine HCl (ALLEGRA PO) Take by mouth.   Multiple Vitamins-Minerals (MULTIVITAMIN ADULTS PO) Take by mouth daily.   Phendimetrazine Tartrate 35 MG TABS Take 1  tablet (35 mg total) by mouth daily.   promethazine-dextromethorphan (PROMETHAZINE-DM) 6.25-15 MG/5ML syrup TAKE 5 ML BY MOUTH FOUR TIMES DAILY AS NEEDED FOR COUGH   topiramate (TOPAMAX) 50 MG tablet Take 1/2 to 1 tablet 2 x /day at Suppertime & Bedtime for Dieting & Weight Loss             /            TAKE                         BY                    MOUTH   vitamin B-12 (CYANOCOBALAMIN) 100 MCG tablet Take 100 mcg by mouth daily.    Vitamin D, Ergocalciferol, (DRISDOL) 1.25 MG (50000 UNIT) CAPS capsule Take     1 capsule     every 7 days       for severe Vit D Deficiency   No current facility-administered medications on file prior to visit.     ROS: all negative except above.   Physical Exam:  BP 128/84   Pulse (!) 102   Temp 97.9 F (36.6 C)   Resp 16   Ht '5\' 2"'$  (1.575 m)   Wt 167 lb 3.2 oz (75.8 kg)   SpO2 98%   BMI 30.58 kg/m   General Appearance: Well nourished, in no apparent distress. Eyes: PERRLA, EOMs, conjunctiva no swelling or erythema Sinuses: No Frontal/maxillary tenderness ENT/Mouth: Ext aud canals clear, TMs without erythema, bulging. No erythema, swelling, or exudate on post pharynx.  Tonsils not swollen or erythematous. Hearing normal.  Neck: Supple, thyroid normal.  Respiratory: Respiratory effort normal, BS equal bilaterally without rales, rhonchi, wheezing or stridor.  Cardio: RRR with no MRGs. Brisk peripheral pulses without edema.  Abdomen: Soft, + BS.  No guarding, rebound, hernias, masses. Pressure suprapubic Lymphatics: Non tender without lymphadenopathy.  Musculoskeletal: Full ROM, 5/5 strength, normal gait. Negative CVA tenderness and flank pain Skin: Warm, dry without rashes, lesions, ecchymosis.  Neuro: Cranial nerves intact. Normal muscle tone, no cerebellar symptoms. Sensation intact.  Psych: Awake and oriented X 3, normal affect, Insight and Judgment appropriate.     Alycia Rossetti, NP 10:48 AM [facility]

## 2021-10-13 LAB — URINALYSIS, ROUTINE W REFLEX MICROSCOPIC
Bacteria, UA: NONE SEEN /HPF
Bilirubin Urine: NEGATIVE
Glucose, UA: NEGATIVE
Hyaline Cast: NONE SEEN /LPF
Ketones, ur: NEGATIVE
Nitrite: NEGATIVE
Specific Gravity, Urine: 1.015 (ref 1.001–1.035)
Squamous Epithelial / HPF: NONE SEEN /HPF (ref <=5)
WBC, UA: >=60 /HPF — AB (ref 0–5)
pH: 7.5 (ref 5.0–8.0)

## 2021-10-13 LAB — URINE CULTURE
MICRO NUMBER:: 13814667
SPECIMEN QUALITY:: ADEQUATE

## 2021-10-13 LAB — MICROSCOPIC MESSAGE

## 2021-10-14 ENCOUNTER — Encounter: Payer: Self-pay | Admitting: Nurse Practitioner

## 2021-10-14 ENCOUNTER — Other Ambulatory Visit: Payer: Self-pay | Admitting: Nurse Practitioner

## 2021-10-14 DIAGNOSIS — R1030 Lower abdominal pain, unspecified: Secondary | ICD-10-CM

## 2021-10-14 DIAGNOSIS — R39198 Other difficulties with micturition: Secondary | ICD-10-CM

## 2021-10-15 ENCOUNTER — Ambulatory Visit
Admission: RE | Admit: 2021-10-15 | Discharge: 2021-10-15 | Disposition: A | Discharge status: Home / Self Care | Payer: 59 | Source: Ambulatory Visit | Attending: Nurse Practitioner | Admitting: Nurse Practitioner

## 2021-10-15 DIAGNOSIS — R1030 Lower abdominal pain, unspecified: Secondary | ICD-10-CM

## 2021-10-15 DIAGNOSIS — R39198 Other difficulties with micturition: Secondary | ICD-10-CM

## 2021-11-23 ENCOUNTER — Other Ambulatory Visit: Payer: Self-pay | Admitting: Internal Medicine

## 2021-11-23 DIAGNOSIS — M542 Cervicalgia: Secondary | ICD-10-CM

## 2021-11-23 MED ORDER — CYCLOBENZAPRINE HCL 10 MG PO TABS: ORAL_TABLET | ORAL | 0 refills | Status: DC

## 2021-11-24 ENCOUNTER — Other Ambulatory Visit: Payer: Self-pay | Admitting: Nurse Practitioner

## 2021-11-24 DIAGNOSIS — E663 Overweight: Secondary | ICD-10-CM

## 2021-11-25 MED ORDER — PHENDIMETRAZINE TARTRATE 35 MG PO TABS: 1.0000 | ORAL_TABLET | Freq: Every day | ORAL | 0 refills | Status: DC

## 2021-12-22 ENCOUNTER — Telehealth: Payer: Self-pay

## 2021-12-22 NOTE — Telephone Encounter (Signed)
Prior auth for Phendimetrazine denied.

## 2022-02-08 ENCOUNTER — Other Ambulatory Visit: Payer: Self-pay | Admitting: Internal Medicine

## 2022-02-08 DIAGNOSIS — Z1231 Encounter for screening mammogram for malignant neoplasm of breast: Secondary | ICD-10-CM

## 2022-02-17 ENCOUNTER — Encounter: Payer: Self-pay | Admitting: Nurse Practitioner

## 2022-02-17 NOTE — Telephone Encounter (Signed)
This pt will need to be tested for FLU/Covid- work her into either [place] or my schedule

## 2022-02-17 NOTE — Progress Notes (Signed)
THIS ENCOUNTER IS A VIRTUAL VISIT DUE TO FLU - PATIENT WAS NOT SEEN IN THE OFFICE.  PATIENT HAS CONSENTED TO VIRTUAL VISIT / TELEMEDICINE VISIT   Virtual Visit via telephone Note  I connected with  Debbe Odea on 02/18/2022 by telephone.  I verified that I am speaking with the correct person using two identifiers.    I discussed the limitations of evaluation and management by telemedicine and the availability of in person appointments. The patient expressed understanding and agreed to proceed.  History of Present Illness:  Pulse 96   Temp (!) 97.2 F (36.2 C)   SpO2 97%  56 y.o. patient contacted office reporting URI sx cough, nasal congestion, fatigue, body aches. she tested positive by office POC Rapid test. OV was conducted by telephone to minimize exposure.   Sx began 3 days ago with cough  Treatments tried so far: mucinex  Exposures: none   Medications   Current Outpatient Medications (Cardiovascular):    atorvastatin (LIPITOR) 20 MG tablet, TAKE 1 TABLET BY MOUTH DAILY FOR CHOLESTEROL  Current Outpatient Medications (Respiratory):    albuterol (PROAIR HFA) 108 (90 Base) MCG/ACT inhaler, Use  2 inhalations  15 minutes  apart with a spacer every 4 hours as Needed to Rescue Asthma   Fexofenadine HCl (ALLEGRA PO), Take by mouth.   promethazine-dextromethorphan (PROMETHAZINE-DM) 6.25-15 MG/5ML syrup, TAKE 5 ML BY MOUTH FOUR TIMES DAILY AS NEEDED FOR COUGH (Patient not taking: Reported on 02/18/2022)  Current Outpatient Medications (Analgesics):    aspirin 81 MG chewable tablet, Chew by mouth daily.  Current Outpatient Medications (Hematological):    vitamin B-12 (CYANOCOBALAMIN) 100 MCG tablet, Take 100 mcg by mouth daily.   Current Outpatient Medications (Other):    buPROPion (WELLBUTRIN XL) 300 MG 24 hr tablet, TAKE 1 TABLET BY MOUTH EVERY MORNING FOR MOOD OR FOCUS OR CONCENTRATION   calcium citrate (CALCITRATE - DOSED IN MG ELEMENTAL CALCIUM) 950 MG tablet,  Take 200 mg of elemental calcium by mouth daily.   citalopram (CELEXA) 40 MG tablet, Take 1 tablet Daily for Anxiety & Mood   conjugated estrogens (PREMARIN) vaginal cream, Insert 1/2 gram once nightly for 14 days, then 1/2 gram three nights a week   cyclobenzaprine (FLEXERIL) 10 MG tablet, TAKE 1/2 TO 1 TABLET BY MOUTH 2 TO 3 TIMES PER DAY AS NEEDED FOR MUSCLE SPASM   Multiple Vitamins-Minerals (MULTIVITAMIN ADULTS PO), Take by mouth daily.   topiramate (TOPAMAX) 50 MG tablet, Take 1/2 to 1 tablet 2 x /day at Suppertime & Bedtime for Dieting & Weight Loss             /            TAKE                         BY                    MOUTH   Vitamin D, Ergocalciferol, (DRISDOL) 1.25 MG (50000 UNIT) CAPS capsule, Take     1 capsule     every 7 days       for severe Vit D Deficiency   Phendimetrazine Tartrate 35 MG TABS, Take 1 tablet (35 mg total) by mouth daily. (Patient not taking: Reported on 02/18/2022)  Allergies: No Known Allergies  Problem list She has Essential hypertension; Hyperlipidemia, mixed; Abnormal glucose; Vitamin D deficiency; Gastroesophageal reflux disease; Asthma due to environmental allergies; Major depression in remission (  [place]); Overweight (BMI 25.0-29.9); Aortic atherosclerosis ([place]) -per CT 09/2018; and CKD (chronic kidney disease) stage 2, GFR 60-89 ml/min on their problem list.   Social History:   reports that she has never smoked. She has never used smokeless tobacco. She reports that she does not drink alcohol and does not use drugs.  Observations/Objective:  General : Well sounding patient in no apparent distress HEENT: no hoarseness, no cough for duration of visit Lungs: speaks in complete sentences, no audible wheezing, no apparent distress Neurological: alert, oriented x 3 Psychiatric: pleasant, judgement appropriate   Assessment and Plan:  Covid 19 Influenza A per rapid screening test in office parking lot Risk factors include: HTN, asthma Symptoms are:  mild Immue support reviewed vitamin c, zinc, vitamin d Take tylenol PRN temp 101+ Push hydration Follow up via mychart or telephone if needed Advised patient obtain O2 monitor; present to ED if persistently <90% or with severe dyspnea, CP, fever uncontrolled by tylenol, confusion, sudden decline        Encounter for screening for COVID-19 Negative  - POC COVID-19  Flu-like symptoms/Influenza A Positive  - POCT Influenza A/B  Acute cough  - promethazine-dextromethorphan (PROMETHAZINE-DM) 6.25-15 MG/5ML syrup; Take 5 mLs by mouth 4 (four) times daily as needed for cough.  Dispense: 240 mL; Refill: 0  Nasal congestion Stay well hydrated Continue Mucinex  Body aches Take Tylenol 1,000 mg up to QID Rest  Follow Up Instructions:  I discussed the assessment and treatment plan with the patient. The patient was provided an opportunity to ask questions and all were answered. The patient agreed with the plan and demonstrated an understanding of the instructions.   The patient was advised to call back or seek an in-person evaluation if the symptoms worsen or if the condition fails to improve as anticipated.  I provided 15 minutes of non-face-to-face time during this encounter.   Darrol Jump, NP

## 2022-02-18 ENCOUNTER — Ambulatory Visit (INDEPENDENT_AMBULATORY_CARE_PROVIDER_SITE_OTHER): Payer: 59 | Admitting: Nurse Practitioner

## 2022-02-18 ENCOUNTER — Other Ambulatory Visit: Payer: Self-pay

## 2022-02-18 ENCOUNTER — Encounter: Payer: Self-pay | Admitting: Nurse Practitioner

## 2022-02-18 VITALS — HR 96 | Temp 97.2°F

## 2022-02-18 DIAGNOSIS — R6889 Other general symptoms and signs: Secondary | ICD-10-CM

## 2022-02-18 DIAGNOSIS — J101 Influenza due to other identified influenza virus with other respiratory manifestations: Secondary | ICD-10-CM | POA: Diagnosis not present

## 2022-02-18 DIAGNOSIS — R0981 Nasal congestion: Secondary | ICD-10-CM

## 2022-02-18 DIAGNOSIS — Z1152 Encounter for screening for COVID-19: Secondary | ICD-10-CM

## 2022-02-18 DIAGNOSIS — R051 Acute cough: Secondary | ICD-10-CM

## 2022-02-18 DIAGNOSIS — R52 Pain, unspecified: Secondary | ICD-10-CM

## 2022-02-18 LAB — POCT INFLUENZA A/B
Influenza A, POC: POSITIVE — AB
Influenza B, POC: NEGATIVE

## 2022-02-18 LAB — POC COVID19 BINAXNOW: SARS Coronavirus 2 Ag: NEGATIVE

## 2022-02-18 MED ORDER — PROMETHAZINE-DM 6.25-15 MG/5ML PO SYRP: 5.0000 mL | ORAL_SOLUTION | Freq: Four times a day (QID) | ORAL | 0 refills | Status: DC | PRN

## 2022-04-06 ENCOUNTER — Ambulatory Visit: Payer: 59

## 2022-04-06 ENCOUNTER — Ambulatory Visit
Admission: RE | Admit: 2022-04-06 | Discharge: 2022-04-06 | Disposition: A | Discharge status: Home / Self Care | Payer: 59 | Source: Ambulatory Visit | Attending: Internal Medicine | Admitting: Internal Medicine

## 2022-04-06 DIAGNOSIS — Z1231 Encounter for screening mammogram for malignant neoplasm of breast: Secondary | ICD-10-CM

## 2022-04-12 ENCOUNTER — Encounter: Payer: 59 | Admitting: Nurse Practitioner
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
## 2022-04-14 ENCOUNTER — Encounter: Payer: Self-pay | Admitting: Nurse Practitioner

## 2022-04-14 ENCOUNTER — Ambulatory Visit (INDEPENDENT_AMBULATORY_CARE_PROVIDER_SITE_OTHER): Payer: 59 | Admitting: Nurse Practitioner

## 2022-04-14 ENCOUNTER — Other Ambulatory Visit: Payer: Self-pay | Admitting: Nurse Practitioner

## 2022-04-14 VITALS — BP 104/62 | HR 92 | Temp 97.5°F | Ht 62.0 in | Wt 171.0 lb

## 2022-04-14 DIAGNOSIS — Z0000 Encounter for general adult medical examination without abnormal findings: Secondary | ICD-10-CM

## 2022-04-14 DIAGNOSIS — Z136 Encounter for screening for cardiovascular disorders: Secondary | ICD-10-CM | POA: Diagnosis not present

## 2022-04-14 DIAGNOSIS — Z0001 Encounter for general adult medical examination with abnormal findings: Secondary | ICD-10-CM

## 2022-04-14 DIAGNOSIS — Z1389 Encounter for screening for other disorder: Secondary | ICD-10-CM

## 2022-04-14 DIAGNOSIS — E559 Vitamin D deficiency, unspecified: Secondary | ICD-10-CM

## 2022-04-14 DIAGNOSIS — E663 Overweight: Secondary | ICD-10-CM

## 2022-04-14 DIAGNOSIS — F325 Major depressive disorder, single episode, in full remission: Secondary | ICD-10-CM

## 2022-04-14 DIAGNOSIS — E782 Mixed hyperlipidemia: Secondary | ICD-10-CM

## 2022-04-14 DIAGNOSIS — I700 Atherosclerosis of aorta: Secondary | ICD-10-CM | POA: Diagnosis not present

## 2022-04-14 DIAGNOSIS — Z1329 Encounter for screening for other suspected endocrine disorder: Secondary | ICD-10-CM

## 2022-04-14 DIAGNOSIS — M79674 Pain in right toe(s): Secondary | ICD-10-CM

## 2022-04-14 DIAGNOSIS — I10 Essential (primary) hypertension: Secondary | ICD-10-CM | POA: Diagnosis not present

## 2022-04-14 DIAGNOSIS — K219 Gastro-esophageal reflux disease without esophagitis: Secondary | ICD-10-CM

## 2022-04-14 DIAGNOSIS — R7309 Other abnormal glucose: Secondary | ICD-10-CM

## 2022-04-14 DIAGNOSIS — J45909 Unspecified asthma, uncomplicated: Secondary | ICD-10-CM

## 2022-04-14 DIAGNOSIS — Z79899 Other long term (current) drug therapy: Secondary | ICD-10-CM

## 2022-04-14 MED ORDER — QVAR REDIHALER 40 MCG/ACT IN AERB: 1.0000 | INHALATION_SPRAY | Freq: Two times a day (BID) | RESPIRATORY_TRACT | 3 refills | Status: DC

## 2022-04-14 NOTE — Patient Instructions (Addendum)
Cough. You need to cough hard to generate pressure in your chest and stimulate the vagus nerve that can cause syncope(fainting)  Syncope, Adult  Syncope is when you pass out or faint for a short time. It is caused by a sudden decrease in blood flow to the brain. This can happen for many reasons. It can sometimes happen when seeing blood, getting a shot (injection), or having pain or strong emotions. Most causes of fainting are not dangerous, but in some cases it can be a sign of a serious medical problem. If you faint, get help right away. Call your local emergency services (911 in the [place] Follow these instructions at home: Watch for any changes in your symptoms. Take these actions to stay safe and help with your symptoms: Knowing when you may be about to faint Signs that you may be about to faint include: Feeling dizzy or light-headed. It may feel like the room is spinning. Feeling weak. Feeling like you may vomit (nauseous). Seeing spots or seeing all white or all black. Having cold, clammy skin. Feeling warm and sweaty. Hearing ringing in the ears. If you start to feel like you might faint, sit or lie down right away. If sitting, lower your head down between your legs. If lying down, raise (elevate) your feet above the level of your heart. Breathe deeply and steadily. Wait until all of the symptoms are gone. Have someone stay with you until you feel better. Medicines Take over-the-counter and prescription medicines only as told by your doctor. If you are taking blood pressure or heart medicine, sit up and stand up slowly. Spend a few minutes getting ready to sit and then stand. This can help you feel less dizzy. Lifestyle Do not drive, use machinery, or play sports until your doctor says it is okay. Do not drink alcohol. Do not smoke or use any products that contain nicotine or tobacco. If you need help quitting, ask your doctor. Avoid hot tubs and saunas. General instructions Talk  with your doctor about your symptoms. You may need to have testing to help find the cause. Drink enough fluid to keep your pee (urine) pale yellow. Avoid standing for a long time. If you must stand for a long time, do movements such as: Moving your legs. Crossing your legs. Flexing and stretching your leg muscles. Squatting. Keep all follow-up visits. Contact a doctor if: You have episodes of near fainting. Get help right away if: You pass out or faint. You hit your head or are injured after fainting. You have any of these symptoms: Fast or uneven heartbeats (palpitations). Pain in your chest, belly, or back. Shortness of breath. You have jerky movements that you cannot control (seizure). You have a very bad headache. You are confused. You have problems with how you see (vision). You are very weak. You have trouble walking. You are bleeding from your mouth or your butt (rectum). You have black or tarry poop (stool). These symptoms may be an emergency. Get help right away. Call your local emergency services (911 in the [place] Do not wait to see if the symptoms will go away. Do not drive yourself to the hospital. Summary Syncope is when you pass out or faint for a short time. It is caused by a sudden decrease in blood flow to the brain. Signs that you may be about to faint include feeling dizzy or light-headed, feeling like you may vomit, seeing all white or all black, or having cold, clammy skin. If  you start to feel like you might faint, sit or lie down right away. Lower your head if sitting, or raise (elevate) your feet if lying down. Breathe deeply and steadily. Wait until all of the symptoms are gone. This information is not intended to replace advice given to you by your health care provider. Make sure you discuss any questions you have with your health care provider. Document Revised: 06/18/2020 Document Reviewed: 06/18/2020 Elsevier Patient Education  [place].

## 2022-04-15 ENCOUNTER — Other Ambulatory Visit: Payer: Self-pay | Admitting: Nurse Practitioner

## 2022-04-15 DIAGNOSIS — R051 Acute cough: Secondary | ICD-10-CM

## 2022-04-15 LAB — LIPID PANEL
Cholesterol: 143 mg/dL (ref <200)
HDL: 54 mg/dL (ref >=50)
LDL Cholesterol (Calc): 72 mg/dL (calc)
Non-HDL Cholesterol (Calc): 89 mg/dL (calc) (ref <130)
Total CHOL/HDL Ratio: 2.6 (calc) (ref <5.0)
Triglycerides: 88 mg/dL (ref <150)

## 2022-04-15 LAB — MICROALBUMIN / CREATININE URINE RATIO
Creatinine, Urine: 119 mg/dL (ref 20–275)
Microalb Creat Ratio: 3 mcg/mg creat (ref <30)
Microalb, Ur: 0.4 mg/dL

## 2022-04-15 LAB — CBC WITH DIFFERENTIAL/PLATELET
Absolute Monocytes: 311 cells/uL (ref 200–950)
Basophils Absolute: 50 cells/uL (ref 0–200)
Basophils Relative: 1.1 %
Eosinophils Absolute: 131 cells/uL (ref 15–500)
Eosinophils Relative: 2.9 %
HCT: 39.4 % (ref 35.0–45.0)
Hemoglobin: 13.3 g/dL (ref 11.7–15.5)
Lymphs Abs: 1269 cells/uL (ref 850–3900)
MCH: 30.9 pg (ref 27.0–33.0)
MCHC: 33.8 g/dL (ref 32.0–36.0)
MCV: 91.6 fL (ref 80.0–100.0)
MPV: 10.4 fL (ref 7.5–12.5)
Monocytes Relative: 6.9 %
Neutro Abs: 2741 cells/uL (ref 1500–7800)
Neutrophils Relative %: 60.9 %
Platelets: 239 10*3/uL (ref 140–400)
RBC: 4.3 10*6/uL (ref 3.80–5.10)
RDW: 12.4 % (ref 11.0–15.0)
Total Lymphocyte: 28.2 %
WBC: 4.5 10*3/uL (ref 3.8–10.8)

## 2022-04-15 LAB — COMPLETE METABOLIC PANEL WITH GFR
AG Ratio: 1.6 (calc) (ref 1.0–2.5)
ALT: 10 U/L (ref 6–29)
AST: 15 U/L (ref 10–35)
Albumin: 4.4 g/dL (ref 3.6–5.1)
Alkaline phosphatase (APISO): 121 U/L (ref 37–153)
BUN: 17 mg/dL (ref 7–25)
CO2: 28 mmol/L (ref 20–32)
Calcium: 9.5 mg/dL (ref 8.6–10.4)
Chloride: 107 mmol/L (ref 98–110)
Creat: 0.97 mg/dL (ref 0.50–1.03)
Globulin: 2.8 g/dL (calc) (ref 1.9–3.7)
Glucose, Bld: 85 mg/dL (ref 65–99)
Potassium: 4.8 mmol/L (ref 3.5–5.3)
Sodium: 144 mmol/L (ref 135–146)
Total Bilirubin: 0.5 mg/dL (ref 0.2–1.2)
Total Protein: 7.2 g/dL (ref 6.1–8.1)
eGFR: 68 mL/min/{1.73_m2} (ref >=60)

## 2022-04-15 LAB — URINALYSIS, ROUTINE W REFLEX MICROSCOPIC
Bilirubin Urine: NEGATIVE
Glucose, UA: NEGATIVE
Hgb urine dipstick: NEGATIVE
Ketones, ur: NEGATIVE
Leukocytes,Ua: NEGATIVE
Nitrite: NEGATIVE
Protein, ur: NEGATIVE
Specific Gravity, Urine: 1.016 (ref 1.001–1.035)
pH: 7 (ref 5.0–8.0)

## 2022-04-15 LAB — HEMOGLOBIN A1C
Hgb A1c MFr Bld: 5.4 % of total Hgb (ref <5.7)
Mean Plasma Glucose: 108 mg/dL
eAG (mmol/L): 6 mmol/L

## 2022-04-15 LAB — VITAMIN D 25 HYDROXY (VIT D DEFICIENCY, FRACTURES): Vit D, 25-Hydroxy: 96 ng/mL (ref 30–100)

## 2022-04-15 LAB — TSH: TSH: 2.71 mIU/L (ref 0.40–4.50)

## 2022-04-15 LAB — MAGNESIUM: Magnesium: 2.1 mg/dL (ref 1.5–2.5)

## 2022-05-17 NOTE — Progress Notes (Deleted)
Assessment and Plan:  There are no diagnoses linked to this encounter.    Further disposition pending results of labs. Discussed med's effects and SE's.   Over 30 minutes of exam, counseling, chart review, and critical decision making was performed.   Future Appointments  Date Time Provider [place]  05/18/2022 11:30 AM Alycia Rossetti, NP [facility] None  10/18/2022  9:30 AM Alycia Rossetti, NP [facility] None  04/17/2023  9:00 AM Alycia Rossetti, NP [facility] None    ------------------------------------------------------------------------------------------------------------------   HPI There were no vitals taken for this visit. 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   atorvastatin (LIPITOR) 20 MG tablet TAKE 1 TABLET BY MOUTH DAILY FOR CHOLESTEROL   beclomethasone (QVAR REDIHALER) 40 MCG/ACT inhaler Inhale 1 puff into the lungs 2 (two) times daily.   buPROPion (WELLBUTRIN XL) 300 MG 24 hr tablet TAKE 1 TABLET BY MOUTH EVERY MORNING FOR MOOD OR FOCUS OR CONCENTRATION   calcium citrate (CALCITRATE - DOSED IN MG ELEMENTAL CALCIUM) 950 MG tablet Take 200 mg of elemental calcium by mouth daily.   citalopram (CELEXA) 40 MG tablet Take 1 tablet Daily for Anxiety & Mood   conjugated estrogens (PREMARIN) vaginal cream Insert 1/2 gram once nightly for 14 days, then 1/2 gram three nights a week   cyclobenzaprine (FLEXERIL) 10 MG tablet TAKE 1/2 TO 1 TABLET BY  MOUTH 2 TO 3 TIMES PER DAY AS NEEDED FOR MUSCLE SPASM   Fexofenadine HCl (ALLEGRA PO) Take by mouth.   Multiple Vitamins-Minerals (MULTIVITAMIN ADULTS PO) Take by mouth daily.   promethazine-dextromethorphan (PROMETHAZINE-DM) 6.25-15 MG/5ML syrup Take 5 mLs by mouth 4 (four) times daily as needed for cough.   topiramate (TOPAMAX) 50 MG tablet Take 1/2 to 1 tablet 2 x /day at Suppertime & Bedtime for Dieting & Weight Loss             /            TAKE                         BY                    MOUTH   vitamin B-12 (CYANOCOBALAMIN) 100 MCG tablet Take 100 mcg by mouth daily.    Vitamin D, Ergocalciferol, (DRISDOL) 1.25 MG (50000 UNIT) CAPS capsule TAKE 1 CAPSULE BY MOUTH EVERY 7 DAYS   No current facility-administered medications on file prior to visit.    ROS: all negative except above.   Physical Exam:  There were no vitals taken for this visit.  General Appearance: Well nourished, in no apparent distress. Eyes: PERRLA, EOMs, conjunctiva no swelling or erythema Sinuses: No Frontal/maxillary tenderness ENT/Mouth: Ext aud canals clear, TMs without erythema, bulging. No erythema, swelling, or exudate on post pharynx.  Tonsils not swollen or erythematous.  Hearing normal.  Neck: Supple, thyroid normal.  Respiratory: Respiratory effort normal, BS equal bilaterally without rales, rhonchi, wheezing or stridor.  Cardio: RRR with no MRGs. Brisk peripheral pulses without edema.  Abdomen: Soft, + BS.  Non tender, no guarding, rebound, hernias, masses. Lymphatics: Non tender without lymphadenopathy.  Musculoskeletal: Full ROM, 5/5 strength, normal gait.  Skin: Warm, dry without rashes, lesions, ecchymosis.  Neuro: Cranial nerves intact. Normal muscle tone, no cerebellar symptoms. Sensation intact.  Psych: Awake and oriented X 3, normal affect, Insight and Judgment appropriate.     Alycia Rossetti, NP 4:42 PM [facility]

## 2022-05-18 ENCOUNTER — Ambulatory Visit: Payer: 59 | Admitting: Nurse Practitioner

## 2022-05-18 ENCOUNTER — Encounter (HOSPITAL_COMMUNITY): Payer: Self-pay

## 2022-05-18 ENCOUNTER — Ambulatory Visit (HOSPITAL_COMMUNITY)
Admission: EM | Admit: 2022-05-18 | Discharge: 2022-05-18 | Disposition: A | Discharge status: Home / Self Care | Payer: 59 | Attending: Emergency Medicine | Admitting: Emergency Medicine

## 2022-05-18 ENCOUNTER — Ambulatory Visit (INDEPENDENT_AMBULATORY_CARE_PROVIDER_SITE_OTHER): Payer: 59

## 2022-05-18 DIAGNOSIS — M79672 Pain in left foot: Secondary | ICD-10-CM | POA: Diagnosis not present

## 2022-05-18 MED ORDER — IBUPROFEN 600 MG PO TABS: 600.0000 mg | ORAL_TABLET | Freq: Four times a day (QID) | ORAL | 0 refills | Status: AC | PRN

## 2022-05-18 NOTE — ED Triage Notes (Signed)
Left foot pain onset one week ago. No falls or injuries. No previous issues with the left foot.

## 2022-05-18 NOTE — Discharge Instructions (Addendum)
Alternate between ibuprofen and tylenol for pain control  Elevate the leg several times daily Apply ice, 10 minutes at a time, several times daily I recommend to try supportive shoes. If needed you can see the orthopedic specialists.

## 2022-05-18 NOTE — ED Provider Notes (Signed)
[place]    CSN: [place]:[id number] Arrival date & time: 05/18/22  [id number]      History   Chief Complaint Chief Complaint  Patient presents with   Foot Pain    HPI Erin Nichols is a 57 y.o. female.  Here with LEFT foot pain Today 10/10. Reports started a week ago and worsening.  Denies known trauma or injury. Wears tennis shoes without much support.  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    Hyperlipidemia 2012   Hypertension 2012   Vitamin D deficiency     Patient Active Problem List   Diagnosis Date Noted   CKD (chronic kidney disease) stage 2, GFR 60-89 ml/min 04/09/2020   Aortic atherosclerosis ([place]) -per CT 09/2018 01/02/2019   Overweight (BMI 25.0-29.9) 02/13/2017   Essential hypertension 11/08/2016   Hyperlipidemia, mixed 11/08/2016   Abnormal glucose 11/08/2016   Vitamin D deficiency 11/08/2016   Gastroesophageal reflux disease 11/08/2016   Asthma due to environmental allergies 11/08/2016   Major depression in remission ([place]) 11/08/2016    Past Surgical History:  Procedure Laterality Date   COLONOSCOPY N/A 2015   screening at age 36 - Negative   FRACTURE SURGERY  1988   facial Fx , nose, Ankle both Arms   l ulnar nonunion  [place]   nonunion 5th metatarsal  2008   r sub talar fusion  2008   redo subtalar fusion  2013   rt ankle surg for non union  2013   SPINE SURGERY  2011   Cx5 - Cx6 fusion w/plate/screws   TONSILLECTOMY AND ADENOIDECTOMY  1972    OB History   No obstetric history on file.      Home Medications    Prior to Admission medications   Medication Sig Start Date End Date Taking? Authorizing Provider  albuterol (PROAIR HFA) 108 (90 Base) MCG/ACT inhaler Use  2 inhalations  15 minutes  apart with a  spacer every 4 hours as Needed to Rescue Asthma 01/11/21  Yes Unk Pinto, MD  aspirin 81 MG chewable tablet Chew by mouth daily.   Yes [provider]  atorvastatin (LIPITOR) 20 MG tablet TAKE 1 TABLET BY MOUTH DAILY FOR CHOLESTEROL 10/12/21  Yes Alycia Rossetti, NP  beclomethasone (QVAR REDIHALER) 40 MCG/ACT inhaler Inhale 1 puff into the lungs 2 (two) times daily. 04/14/22  Yes Alycia Rossetti, NP  buPROPion (WELLBUTRIN XL) 300 MG 24 hr tablet TAKE 1 TABLET BY MOUTH EVERY MORNING FOR MOOD OR FOCUS OR CONCENTRATION 08/15/21  Yes Alycia Rossetti, NP  calcium citrate (CALCITRATE - DOSED IN MG ELEMENTAL CALCIUM) 950 MG tablet Take 200 mg of elemental calcium by mouth daily.   Yes [provider]  citalopram (CELEXA) 40 MG tablet Take 1 tablet Daily for Anxiety & Mood 10/06/21  Yes Alycia Rossetti, NP  conjugated estrogens (PREMARIN) vaginal cream Insert 1/2 gram once nightly for 14 days, then 1/2 gram three nights a week 10/11/21  Yes Alycia Rossetti, NP  cyclobenzaprine (FLEXERIL) 10 MG tablet TAKE 1/2 TO 1 TABLET BY MOUTH 2 TO 3 TIMES PER DAY AS NEEDED FOR MUSCLE SPASM 11/23/21  Yes Unk Pinto, MD  Fexofenadine HCl (ALLEGRA PO)  Take by mouth.   Yes [provider]  ibuprofen (ADVIL) 600 MG tablet Take 1 tablet (600 mg total) by mouth every 6 (six) hours as needed. 05/18/22  Yes Hassani Sliney, PA-C  Multiple Vitamins-Minerals (MULTIVITAMIN ADULTS PO) Take by mouth daily.   Yes [provider]  topiramate (TOPAMAX) 50 MG tablet Take 1/2 to 1 tablet 2 x /day at Suppertime & Bedtime for Dieting & Weight Loss             /            TAKE                         BY                    MOUTH 08/14/21  Yes Unk Pinto, MD  vitamin B-12 (CYANOCOBALAMIN) 100 MCG tablet Take 100 mcg by mouth daily.    Yes [provider]  Vitamin D, Ergocalciferol, (DRISDOL) 1.25 MG (50000 UNIT) CAPS capsule TAKE 1 CAPSULE BY MOUTH EVERY 7 DAYS 04/14/22  Yes  Alycia Rossetti, NP    Family History Family History  Problem Relation Age of Onset   Diabetes Mother    Leukemia Mother    CAD Father    Cirrhosis Father    Alcohol abuse Father    Cirrhosis Sister        hep C, alcohol, drug user   Hypertension Brother    Heart disease Brother    Heart failure Brother    Stroke Maternal Grandmother     Social History Social History   Tobacco Use   Smoking status: Never   Smokeless tobacco: Never  Vaping Use   Vaping Use: Never used  Substance Use Topics   Alcohol use: No   Drug use: No     Allergies   Patient has no known allergies.   Review of Systems Review of Systems As per HPI  Physical Exam Triage Vital Signs ED Triage Vitals  Enc Vitals Group     BP 05/18/22 0918 130/88     Pulse Rate 05/18/22 0918 85     Resp 05/18/22 0918 18     Temp 05/18/22 0918 98.1 F (36.7 C)     Temp Source 05/18/22 0918 Oral     SpO2 05/18/22 0918 95 %     Weight 05/18/22 0918 169 lb (76.7 kg)     Height 05/18/22 0918 5\' 2"  (1.575 m)     Head Circumference --      Peak Flow --      Pain Score 05/18/22 0917 10     Pain Loc --      Pain Edu? --      Excl. in [place]? --    No data found.  Updated Vital Signs BP 130/88 (BP Location: Right Arm)   Pulse 85   Temp 98.1 F (36.7 C) (Oral)   Resp 18   Ht 5\' 2"  (1.575 m)   Wt 169 lb (76.7 kg)   SpO2 95%   BMI 30.91 kg/m   Physical Exam Vitals and nursing note reviewed.  Constitutional:      General: She is not in acute distress.    Appearance: Normal appearance.  HENT:     Mouth/Throat:     Pharynx: Oropharynx is clear.  Cardiovascular:     Rate and Rhythm: Normal rate and regular rhythm.     Pulses: Normal pulses.  Pulmonary:     Effort: Pulmonary effort is normal.  Musculoskeletal:        General: Swelling and tenderness present. Normal range of motion.     Comments: Dorsal LEFT foot has area of tenderness and mild swelling at base of 2nd/3rd toes  Skin:    General:  Skin is warm and dry.     Capillary Refill: Capillary refill takes less than 2 seconds.  Neurological:     Mental Status: She is alert and oriented to person, place, and time.     Comments: Sensation and cap refill normal. Strong pedal pulse     UC Treatments / Results  Labs (all labs ordered are listed, but only abnormal results are displayed) Labs Reviewed - No data to display  EKG   Radiology DG Foot Complete Left  Result Date: 05/18/2022 CLINICAL DATA:  pain for a week without known injury EXAM: LEFT FOOT - COMPLETE 3+ VIEW COMPARISON:  Foot radiographs 02/20/2017. FINDINGS: There is no evidence of fracture or dislocation. Large os perineum noted incidentally. Joint spaces are maintained. Soft tissues are unremarkable. IMPRESSION: No acute bony findings. Electronically Signed   By: Margaretha Sheffield M.D.   On: 05/18/2022 09:53    Procedures Procedures   Medications Ordered in UC Medications - No data to display  Initial Impression / Assessment and Plan / UC Course  I have reviewed the triage vital signs and the nursing notes.  Pertinent labs & imaging results that were available during my care of the patient were reviewed by me and considered in my medical decision making (see chart for details).  Xray left foot negative Question morton's neuroma. Vs other soft tissue injury Discussed wearing more supportive shoes, trying inserts Tylenol and ibuprofen, elevating leg and applying ice Can follow with ortho if persisting   Final Clinical Impressions(s) / UC Diagnoses   Final diagnoses:  Left foot pain     Discharge Instructions      Alternate between ibuprofen and tylenol for pain control  Elevate the leg several times daily Apply ice, 10 minutes at a time, several times daily I recommend to try supportive shoes. If needed you can see the orthopedic specialists.      ED Prescriptions     Medication Sig Dispense Auth. Provider   ibuprofen (ADVIL) 600 MG  tablet Take 1 tablet (600 mg total) by mouth every 6 (six) hours as needed. 30 tablet Yatzary Merriweather, Wells Guiles, PA-C      PDMP not reviewed this encounter.   Les Pou, Hershal Coria 05/18/22 1110

## 2022-06-01 ENCOUNTER — Encounter: Payer: Self-pay | Admitting: Nurse Practitioner

## 2022-06-01 ENCOUNTER — Other Ambulatory Visit: Payer: Self-pay | Admitting: Nurse Practitioner

## 2022-06-01 DIAGNOSIS — J45901 Unspecified asthma with (acute) exacerbation: Secondary | ICD-10-CM

## 2022-06-01 DIAGNOSIS — J45909 Unspecified asthma, uncomplicated: Secondary | ICD-10-CM

## 2022-06-01 MED ORDER — ALBUTEROL SULFATE HFA 108 (90 BASE) MCG/ACT IN AERS: INHALATION_SPRAY | RESPIRATORY_TRACT | 3 refills | Status: DC

## 2022-06-03 ENCOUNTER — Other Ambulatory Visit: Payer: Self-pay | Admitting: Internal Medicine

## 2022-06-03 DIAGNOSIS — M542 Cervicalgia: Secondary | ICD-10-CM

## 2022-06-03 MED ORDER — CYCLOBENZAPRINE HCL 10 MG PO TABS
ORAL_TABLET | ORAL | 0 refills | Status: DC
Start: 2022-06-03 — End: 2022-10-18

## 2022-06-07 ENCOUNTER — Other Ambulatory Visit: Payer: Self-pay | Admitting: Nurse Practitioner

## 2022-06-07 DIAGNOSIS — E782 Mixed hyperlipidemia: Secondary | ICD-10-CM

## 2022-08-21 ENCOUNTER — Other Ambulatory Visit: Payer: Self-pay | Admitting: Internal Medicine

## 2022-08-21 ENCOUNTER — Other Ambulatory Visit: Payer: Self-pay | Admitting: Nurse Practitioner

## 2022-08-21 DIAGNOSIS — E669 Obesity, unspecified: Secondary | ICD-10-CM

## 2022-08-21 DIAGNOSIS — F32A Depression, unspecified: Secondary | ICD-10-CM

## 2022-10-17 NOTE — Progress Notes (Unsigned)
FOLLOW UP  Assessment and Plan:   Aortic Atherosclerosis Control blood pressure, weight, blood sugars and cholesterol  Hypertension Well controlled off of meds at this time Monitor blood pressure at home; patient to call if consistently greater than 130/80 Continue DASH diet.   Reminder to go to the ER if any CP, SOB, nausea, dizziness, severe HA, changes vision/speech, left arm numbness and tingling and jaw pain. - CBC - CMP  Hyperlipidemia At goal on Atorvastatin 20 mg QD Continue low cholesterol diet and exercise.  -lipid panel.   Abnormal glucose Discussed risks associated with elevated glucose Recommend prudent diet, portion control, maintenance of weight in normal range, regular aerobic exercise Defer A1C; check annually - monitor sugars by CMP  GERD Off of meds; reports well controlled sx Discussed diet, avoiding triggers and other lifestyle changes - Magnesium  Overweight Long discussion about weight loss, diet, and exercise Recommended diet heavy in fruits and veggies and low in animal meats, cheeses, and dairy products, appropriate calorie intake Discussed appropriate weight for height Continues on Topamax, and restart Phentermine 37.5 mg every day Discussed adding exercise focusing on muscle building, reduce processed carbohydrate, increase protein, fresh fruits/veggies, nuts/seeds, etc Follow up in 3 months  Left hip pain Use heat, tylenol and rest If symptoms worsen notify the office and will get imaging Declines ortho referral and imaging at this time  Depression/anxiety She reports fairly controlled by current regimen- Wellbutrin 300 mg every day  Lifestyle discussed: diet/exerise, sleep hygiene, stress management, hydration  Vitamin D Def/ osteoporosis prevention At goal at recent check; continue supplement for goal range 60-100 Defer vitamin D level  Estrogen deficiency/Vaginal Dryness/Decreased libido Premarin vaginal cream 1 applicator nightly  x 14 days and then 3 times a week If this does not increase libido will plan to change Celexa as possible cause  Multiple fractures/estrogen deficiency DEXA ordered  Medication management -     CBC with Differential/Platelet -     COMPLETE METABOLIC PANEL WITH GFR -     Magnesium -     Lipid panel  Vitamin D deficiency Continue Vit D supplementation to maintain value in therapeutic level of 60-100  -     Vitamin D, Ergocalciferol, (DRISDOL) 1.25 MG (50000 UNIT) CAPS capsule; TAKE 1 CAPSULE BY MOUTH EVERY 7 DAYS  Asthma due to environmental allergies Continue QVAR daily and albuterol as needed -     beclomethasone (QVAR REDIHALER) 40 MCG/ACT inhaler; Inhale 1 puff into the lungs 2 (two) times daily.  Neck pain Use as needed for neck pain -     cyclobenzaprine (FLEXERIL) 10 MG tablet; Take  1/2 to 1 tablet   2 to 3 x / day  ONLY  if needed for Muscle Spasm                                                                 /                                TAKE                                BY  MOUTH       Continue diet and meds as discussed. Further disposition pending results of labs. Discussed med's effects and SE's.   Over 30 minutes of exam, counseling, chart review, and critical decision making was performed.   Future Appointments  Date Time Provider Department Center  04/17/2023  9:00 AM Raynelle Dick, NP [facility] None    ----------------------------------------------------------------------------------------------------------------------  HPI 57 y.o. female  presents for 3 month follow up on hypertension, cholesterol, GERD, blood sugar monitoring, weight, depression and vitamin D deficiency.    She has pain in left hip that is sharp and occurs mostly with walking. Will describe as an 8/10 on pain scale.  Does not occur with every episode of walking. She does not wish to have a workup right now. Continues to have intermittent pain  in right foot 1st metatarsal  BMI is Body mass index is 31.42 kg/m., she has been working on diet, no particular weight goal Is still taking Topamax , the Phentermine gave her very dry mouth and could not tolerate. Tooth split in half under crown last October- pulled tooth and did donor bone graft- implant was placed.  2 months later in 05/2022- the implant was not stablized and bone deteriorated so implant had to be removed Now she has to go with a bridge. She does try to do some walking.  Wt Readings from Last 3 Encounters:  10/18/22 171 lb 12.8 oz (77.9 kg)  05/18/22 169 lb (76.7 kg)  04/14/22 171 lb (77.6 kg)   she has ongoing depression with anxious features and is currently on celexa 40 mg daily, wellbutrin XR 300 mg daily- her mood has been ok.  Mother and father in law are both sick.  They live next door   She has also fractured several toes and did not know cause of one.  Does take calcium and Vit D. Will order DEXA  She has a history of asthma that is controlled with QVAR daily and albuterol as needed.   Her blood pressure has been controlled at home, today their BP is BP: 110/72  BP Readings from Last 3 Encounters:  10/18/22 110/72  05/18/22 130/88  04/14/22 104/62  She does workout. She denies chest pain, shortness of breath, dizziness.   She is on cholesterol medication (atorvastatin 20 mg daily) and denies myalgias. Her cholesterol is not at goal. The cholesterol last visit was:   Lab Results  Component Value Date   CHOL 143 04/14/2022   HDL 54 04/14/2022   LDLCALC 72 04/14/2022   TRIG 88 04/14/2022   CHOLHDL 2.6 04/14/2022    She has been working on diet and exercise for blood sugar control, and denies increased appetite, nausea, paresthesia of the feet, polydipsia, polyuria, visual disturbances and vomiting. Last A1C in the office was:  Lab Results  Component Value Date   HGBA1C 5.4 04/14/2022   Patient is on Vitamin D supplement  Lab Results  Component Value Date    VD25OH 96 04/14/2022      Current Medications:  Current Outpatient Medications on File Prior to Visit  Medication Sig   albuterol (PROAIR HFA) 108 (90 Base) MCG/ACT inhaler Use  2 inhalations  15 minutes  apart with a spacer every 4 hours as Needed to Rescue Asthma   aspirin 81 MG chewable tablet Chew by mouth daily.   atorvastatin (LIPITOR) 20 MG tablet TAKE 1 TABLET BY MOUTH DAILY FOR CHOLESTEROL   beclomethasone (QVAR REDIHALER) 40 MCG/ACT inhaler Inhale 1 puff  into the lungs 2 (two) times daily.   buPROPion (WELLBUTRIN XL) 300 MG 24 hr tablet TAKE 1 TABLET BY MOUTH EVERY MORNING FOR MOOD OR FOCUS OR CONCENTRATION   calcium citrate (CALCITRATE - DOSED IN MG ELEMENTAL CALCIUM) 950 MG tablet Take 200 mg of elemental calcium by mouth daily.   citalopram (CELEXA) 40 MG tablet Take 1 tablet Daily for Anxiety & Mood   conjugated estrogens (PREMARIN) vaginal cream Insert 1/2 gram once nightly for 14 days, then 1/2 gram three nights a week   cyclobenzaprine (FLEXERIL) 10 MG tablet Take  1/2 to 1 tablet   2 to 3 x / day  ONLY  if needed for Muscle Spasm                                                                 /                                TAKE                                BY                                        MOUTH   Fexofenadine HCl (ALLEGRA PO) Take by mouth.   ibuprofen (ADVIL) 600 MG tablet Take 1 tablet (600 mg total) by mouth every 6 (six) hours as needed.   Multiple Vitamins-Minerals (MULTIVITAMIN ADULTS PO) Take by mouth daily.   topiramate (TOPAMAX) 50 MG tablet TAKE 1/2 TO 1 TABLET BY MOUTH TWICE DAILY AT SUPPERTIME AND BEDTIME FOR DIETING AND WEIGHT LOSS   vitamin B-12 (CYANOCOBALAMIN) 100 MCG tablet Take 100 mcg by mouth daily.    Vitamin D, Ergocalciferol, (DRISDOL) 1.25 MG (50000 UNIT) CAPS capsule TAKE 1 CAPSULE BY MOUTH EVERY 7 DAYS   No current facility-administered medications on file prior to visit.     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  HENT:  Negative for hearing loss and tinnitus.   Eyes:  Negative for blurred vision and double vision.  Respiratory:  Negative for cough, shortness of breath and wheezing.        Asthma symptoms controlled with inhalers  Cardiovascular:  Negative for chest pain, palpitations, orthopnea, claudication and leg swelling.  Gastrointestinal:  Negative for abdominal pain, blood in stool, constipation, diarrhea, heartburn, melena, nausea and vomiting.  Genitourinary: Negative.   Musculoskeletal:  Positive for joint pain (right great toe, left hip) and neck pain. Negative for myalgias.       Multiple toe fractures  Skin:  Negative for rash.  Neurological:  Negative for dizziness, tingling, sensory change, weakness and headaches.  Endo/Heme/Allergies:  Negative for polydipsia.  Psychiatric/Behavioral:  Negative for depression and substance abuse. The patient is nervous/anxious (chronic since childhood, improved with medication).   All other systems reviewed and are negative.    Physical Exam: BP 110/72   Pulse 87   Temp 97.7 F (36.5 C)   Ht 5\' 2"  (1.575 m)   Wt 171 lb 12.8 oz (77.9 kg)   SpO2 96%   BMI 31.42 kg/m  Wt Readings from Last 3 Encounters:  10/18/22 171 lb 12.8 oz (77.9 kg)  05/18/22 169 lb (76.7 kg)  04/14/22 171 lb (77.6 kg)   General Appearance: Pleasant obese female, in no apparent distress. Eyes: PERRLA, EOMs, conjunctiva no swelling or erythema Sinuses: No Frontal/maxillary tenderness ENT/Mouth: Ext aud canals clear, TMs without erythema, bulging.  No erythema, swelling, or exudate on post pharynx. Hearing normal.  Neck: Supple, thyroid normal.  Respiratory: Respiratory effort normal, BS equal bilaterally without rales, rhonchi, wheezing or stridor.  Cardio: RRR with no MRGs. Brisk peripheral pulses without edema.  Abdomen: Soft, + BS.  Non tender, no guarding, rebound, hernias, masses. Lymphatics: Non tender without lymphadenopathy.  Musculoskeletal: Full ROM, 5/5 strength, Normal gait. Tenderness great toe right foot, can not reproduce left hip pain Skin: Warm, dry without rashes, lesions, ecchymosis.  Neuro: Cranial nerves intact. No cerebellar symptoms.  Psych: Awake and oriented X 3, mildly anxious affect, Insight and Judgment appropriate.     Raynelle Dick, NP 10:13 AM [facility]

## 2022-10-18 ENCOUNTER — Other Ambulatory Visit: Payer: Self-pay | Admitting: Nurse Practitioner

## 2022-10-18 ENCOUNTER — Ambulatory Visit (INDEPENDENT_AMBULATORY_CARE_PROVIDER_SITE_OTHER): Payer: 59 | Admitting: Nurse Practitioner

## 2022-10-18 ENCOUNTER — Encounter: Payer: Self-pay | Admitting: Nurse Practitioner

## 2022-10-18 VITALS — BP 110/72 | HR 87 | Temp 97.7°F | Ht 62.0 in | Wt 171.8 lb

## 2022-10-18 DIAGNOSIS — E663 Overweight: Secondary | ICD-10-CM

## 2022-10-18 DIAGNOSIS — R7309 Other abnormal glucose: Secondary | ICD-10-CM | POA: Diagnosis not present

## 2022-10-18 DIAGNOSIS — F32A Depression, unspecified: Secondary | ICD-10-CM

## 2022-10-18 DIAGNOSIS — J45909 Unspecified asthma, uncomplicated: Secondary | ICD-10-CM

## 2022-10-18 DIAGNOSIS — I10 Essential (primary) hypertension: Secondary | ICD-10-CM

## 2022-10-18 DIAGNOSIS — Z79899 Other long term (current) drug therapy: Secondary | ICD-10-CM

## 2022-10-18 DIAGNOSIS — M25552 Pain in left hip: Secondary | ICD-10-CM

## 2022-10-18 DIAGNOSIS — I700 Atherosclerosis of aorta: Secondary | ICD-10-CM

## 2022-10-18 DIAGNOSIS — E2839 Other primary ovarian failure: Secondary | ICD-10-CM

## 2022-10-18 DIAGNOSIS — T07XXXA Unspecified multiple injuries, initial encounter: Secondary | ICD-10-CM

## 2022-10-18 DIAGNOSIS — F419 Anxiety disorder, unspecified: Secondary | ICD-10-CM

## 2022-10-18 DIAGNOSIS — F325 Major depressive disorder, single episode, in full remission: Secondary | ICD-10-CM

## 2022-10-18 DIAGNOSIS — E559 Vitamin D deficiency, unspecified: Secondary | ICD-10-CM

## 2022-10-18 DIAGNOSIS — E782 Mixed hyperlipidemia: Secondary | ICD-10-CM | POA: Diagnosis not present

## 2022-10-18 DIAGNOSIS — Z0000 Encounter for general adult medical examination without abnormal findings: Secondary | ICD-10-CM

## 2022-10-18 DIAGNOSIS — M542 Cervicalgia: Secondary | ICD-10-CM

## 2022-10-18 DIAGNOSIS — K219 Gastro-esophageal reflux disease without esophagitis: Secondary | ICD-10-CM

## 2022-10-18 MED ORDER — CYCLOBENZAPRINE HCL 10 MG PO TABS
ORAL_TABLET | ORAL | 0 refills | Status: DC
Start: 2022-10-18 — End: 2023-04-12

## 2022-10-18 MED ORDER — PHENTERMINE HCL 37.5 MG PO TABS: ORAL_TABLET | ORAL | 0 refills | Status: DC

## 2022-10-18 MED ORDER — ESTROGENS CONJUGATED 0.625 MG/GM VA CREA: TOPICAL_CREAM | VAGINAL | 12 refills | Status: AC

## 2022-10-18 MED ORDER — VITAMIN D (ERGOCALCIFEROL) 1.25 MG (50000 UNIT) PO CAPS
ORAL_CAPSULE | ORAL | 3 refills | Status: DC
Start: 2022-10-18 — End: 2023-03-15

## 2022-10-18 MED ORDER — ATORVASTATIN CALCIUM 20 MG PO TABS
ORAL_TABLET | ORAL | 1 refills | Status: DC
Start: 2022-10-18 — End: 2023-06-13

## 2022-10-18 MED ORDER — CITALOPRAM HYDROBROMIDE 40 MG PO TABS
ORAL_TABLET | ORAL | 3 refills | Status: DC
Start: 2022-10-18 — End: 2023-10-30

## 2022-10-18 MED ORDER — QVAR REDIHALER 40 MCG/ACT IN AERB: 1.0000 | INHALATION_SPRAY | Freq: Two times a day (BID) | RESPIRATORY_TRACT | 3 refills | Status: AC

## 2022-10-18 NOTE — Patient Instructions (Signed)

## 2022-10-19 LAB — LIPID PANEL
Cholesterol: 153 mg/dL (ref <200)
HDL: 47 mg/dL — ABNORMAL LOW (ref >=50)
LDL Cholesterol (Calc): 83 mg/dL
Non-HDL Cholesterol (Calc): 106 mg/dL (ref <130)
Total CHOL/HDL Ratio: 3.3 (calc) (ref <5.0)
Triglycerides: 130 mg/dL (ref <150)

## 2022-10-19 LAB — COMPLETE METABOLIC PANEL WITH GFR
AG Ratio: 1.8 (calc) (ref 1.0–2.5)
ALT: 13 U/L (ref 6–29)
AST: 16 U/L (ref 10–35)
Albumin: 4.6 g/dL (ref 3.6–5.1)
Alkaline phosphatase (APISO): 122 U/L (ref 37–153)
BUN: 15 mg/dL (ref 7–25)
CO2: 28 mmol/L (ref 20–32)
Calcium: 10 mg/dL (ref 8.6–10.4)
Chloride: 107 mmol/L (ref 98–110)
Creat: 1.02 mg/dL (ref 0.50–1.03)
Globulin: 2.6 g/dL (ref 1.9–3.7)
Glucose, Bld: 87 mg/dL (ref 65–99)
Potassium: 5.1 mmol/L (ref 3.5–5.3)
Sodium: 143 mmol/L (ref 135–146)
Total Bilirubin: 0.4 mg/dL (ref 0.2–1.2)
Total Protein: 7.2 g/dL (ref 6.1–8.1)
eGFR: 63 mL/min/{1.73_m2} (ref >=60)

## 2022-10-19 LAB — CBC WITH DIFFERENTIAL/PLATELET
Absolute Monocytes: 319 {cells}/uL (ref 200–950)
Basophils Absolute: 72 {cells}/uL (ref 0–200)
Basophils Relative: 1.3 %
Eosinophils Absolute: 121 cells/uL (ref 15–500)
Eosinophils Relative: 2.2 %
HCT: 41.9 % (ref 35.0–45.0)
Hemoglobin: 13.8 g/dL (ref 11.7–15.5)
Lymphs Abs: 1414 cells/uL (ref 850–3900)
MCH: 30.9 pg (ref 27.0–33.0)
MCHC: 32.9 g/dL (ref 32.0–36.0)
MCV: 93.7 fL (ref 80.0–100.0)
MPV: 10.8 fL (ref 7.5–12.5)
Monocytes Relative: 5.8 %
Neutro Abs: 3575 {cells}/uL (ref 1500–7800)
Neutrophils Relative %: 65 %
Platelets: 258 10*3/uL (ref 140–400)
RBC: 4.47 10*6/uL (ref 3.80–5.10)
RDW: 12.3 % (ref 11.0–15.0)
Total Lymphocyte: 25.7 %
WBC: 5.5 10*3/uL (ref 3.8–10.8)

## 2022-10-19 LAB — MAGNESIUM: Magnesium: 2.3 mg/dL (ref 1.5–2.5)

## 2022-12-23 ENCOUNTER — Encounter: Payer: Self-pay | Admitting: Nurse Practitioner

## 2022-12-24 ENCOUNTER — Other Ambulatory Visit: Payer: Self-pay | Admitting: Nurse Practitioner

## 2022-12-24 DIAGNOSIS — U071 COVID-19: Secondary | ICD-10-CM

## 2022-12-24 MED ORDER — PROMETHAZINE-DM 6.25-15 MG/5ML PO SYRP
5.0000 mL | ORAL_SOLUTION | Freq: Four times a day (QID) | ORAL | 1 refills | Status: DC | PRN
Start: 2022-12-24 — End: 2023-02-23

## 2022-12-24 MED ORDER — DEXAMETHASONE 1 MG PO TABS
ORAL_TABLET | ORAL | 0 refills | Status: DC
Start: 2022-12-24 — End: 2023-05-29

## 2022-12-24 NOTE — Progress Notes (Signed)
THIS ENCOUNTER IS A VIRTUAL VISIT DUE TO COVID-19 - PATIENT WAS NOT SEEN IN THE OFFICE.  PATIENT HAS CONSENTED TO VIRTUAL VISIT / TELEMEDICINE VISIT   History of Present Illness:  There were no vitals taken for this visit. 57 y.o. patient contacted office reporting URI sx . she tested positive by positive home test.      Medications  Current Outpatient Medications (Endocrine & Metabolic):    dexamethasone (DECADRON) 1 MG tablet, Take 3 tabs for 3 days, 2 tabs for 3 days 1 tab for 5 days. Take with food.  Current Outpatient Medications (Cardiovascular):    atorvastatin (LIPITOR) 20 MG tablet, TAKE 1 TABLET BY MOUTH DAILY FOR CHOLESTEROL  Current Outpatient Medications (Respiratory):    promethazine-dextromethorphan (PROMETHAZINE-DM) 6.25-15 MG/5ML syrup, Take 5 mLs by mouth 4 (four) times daily as needed for cough.   albuterol (PROAIR HFA) 108 (90 Base) MCG/ACT inhaler, Use  2 inhalations  15 minutes  apart with a spacer every 4 hours as Needed to Rescue Asthma   beclomethasone (QVAR REDIHALER) 40 MCG/ACT inhaler, Inhale 1 puff into the lungs 2 (two) times daily.   Fexofenadine HCl (ALLEGRA PO), Take by mouth.  Current Outpatient Medications (Analgesics):    aspirin 81 MG chewable tablet, Chew by mouth daily.   ibuprofen (ADVIL) 600 MG tablet, Take 1 tablet (600 mg total) by mouth every 6 (six) hours as needed.  Current Outpatient Medications (Hematological):    vitamin B-12 (CYANOCOBALAMIN) 100 MCG tablet, Take 100 mcg by mouth daily.   Current Outpatient Medications (Other):    buPROPion (WELLBUTRIN XL) 300 MG 24 hr tablet, TAKE 1 TABLET BY MOUTH EVERY MORNING FOR MOOD OR FOCUS OR CONCENTRATION   calcium citrate (CALCITRATE - DOSED IN MG ELEMENTAL CALCIUM) 950 MG tablet, Take 200 mg of elemental calcium by mouth daily.   citalopram (CELEXA) 40 MG tablet, Take 1 tablet Daily for Anxiety & Mood   conjugated estrogens (PREMARIN) vaginal cream, Insert 1/2 gram once nightly for 14  days, then 1/2 gram three nights a week   cyclobenzaprine (FLEXERIL) 10 MG tablet, Take  1/2 to 1 tablet   2 to 3 x / day  ONLY  if needed for Muscle Spasm                                                                 /                                TAKE                                BY                                        MOUTH   Multiple Vitamins-Minerals (MULTIVITAMIN ADULTS PO), Take by mouth daily.   phentermine (ADIPEX-P) 37.5 MG tablet, Take 1/2 to 1 tablet every morning for dieting & weightloss   topiramate (TOPAMAX) 50 MG tablet, TAKE 1/2 TO 1 TABLET BY MOUTH TWICE DAILY AT SUPPERTIME AND BEDTIME FOR DIETING  AND WEIGHT LOSS   Vitamin D, Ergocalciferol, (DRISDOL) 1.25 MG (50000 UNIT) CAPS capsule, TAKE 1 CAPSULE BY MOUTH EVERY 7 DAYS  Allergies: No Known Allergies  Problem list She has Essential hypertension; Hyperlipidemia, mixed; Abnormal glucose; Vitamin D deficiency; Gastroesophageal reflux disease; Asthma due to environmental allergies; Major depression in remission ([facility]); Overweight (BMI 25.0-29.9); Aortic atherosclerosis ([facility]) -per CT 09/2018; and CKD (chronic kidney disease) stage 2, GFR 60-89 ml/min on their problem list.   Social History:   reports that she has never smoked. She has never used smokeless tobacco. She reports that she does not drink alcohol and does not use drugs.  Observations/Objective:  General : Well sounding patient in no apparent distress HEENT: no hoarseness, no cough for duration of visit Lungs: speaks in complete sentences, no audible wheezing, no apparent distress Neurological: alert, oriented x 3 Psychiatric: pleasant, judgement appropriate   Assessment and Plan:  Covid 19 Covid 19 positive per rapid screening test at home Risk factors include:  Patient Active Problem List   Diagnosis Date Noted   CKD (chronic kidney disease) stage 2, GFR 60-89 ml/min 04/09/2020   Aortic atherosclerosis ([facility]) -per CT 09/2018 01/02/2019   Overweight (BMI  25.0-29.9) 02/13/2017   Essential hypertension 11/08/2016   Hyperlipidemia, mixed 11/08/2016   Abnormal glucose 11/08/2016   Vitamin D deficiency 11/08/2016   Gastroesophageal reflux disease 11/08/2016   Asthma due to environmental allergies 11/08/2016   Major depression in remission ([facility]) 11/08/2016    Symptoms are: mild Immue support reviewed Vit C. Vit D and Zinc Take tylenol PRN temp 101+ Push hydration Regular ambulation or calf exercises exercises for clot prevention and 81 mg ASA unless contraindicated Sx supportive therapy suggested Follow up via mychart or telephone if needed Advised patient obtain O2 monitor; present to ED if persistently <90% or with severe dyspnea, CP, fever uncontrolled by tylenol, confusion, sudden decline       Should remain in isolation 5 days from testing positive and then wear a mask when around other people for the following 5 days Diagnoses and all orders for this visit:  COVID-19 -     promethazine-dextromethorphan (PROMETHAZINE-DM) 6.25-15 MG/5ML syrup; Take 5 mLs by mouth 4 (four) times daily as needed for cough. -     dexamethasone (DECADRON) 1 MG tablet; Take 3 tabs for 3 days, 2 tabs for 3 days 1 tab for 5 days. Take with food.       Raynelle Dick, NP

## 2023-02-21 ENCOUNTER — Other Ambulatory Visit: Payer: Self-pay | Admitting: Nurse Practitioner

## 2023-02-21 DIAGNOSIS — U071 COVID-19: Secondary | ICD-10-CM

## 2023-03-13 ENCOUNTER — Other Ambulatory Visit: Payer: Self-pay | Admitting: Nurse Practitioner

## 2023-03-13 DIAGNOSIS — E559 Vitamin D deficiency, unspecified: Secondary | ICD-10-CM

## 2023-04-12 ENCOUNTER — Other Ambulatory Visit: Payer: Self-pay

## 2023-04-12 DIAGNOSIS — M542 Cervicalgia: Secondary | ICD-10-CM

## 2023-04-12 MED ORDER — PHENTERMINE HCL 37.5 MG PO TABS: ORAL_TABLET | ORAL | 0 refills | Status: DC

## 2023-04-12 MED ORDER — CYCLOBENZAPRINE HCL 10 MG PO TABS
ORAL_TABLET | ORAL | 0 refills | Status: DC
Start: 2023-04-12 — End: 2023-09-21

## 2023-04-17 ENCOUNTER — Encounter: Payer: 59 | Admitting: Nurse Practitioner

## 2023-04-24 ENCOUNTER — Ambulatory Visit: Payer: 59

## 2023-04-24 ENCOUNTER — Other Ambulatory Visit: Payer: 59

## 2023-05-29 ENCOUNTER — Encounter: Payer: Self-pay | Admitting: Internal Medicine

## 2023-05-29 ENCOUNTER — Ambulatory Visit: Admitting: Internal Medicine

## 2023-05-29 VITALS — BP 124/68 | HR 93 | Temp 98.0°F | Ht 62.0 in | Wt 171.0 lb

## 2023-05-29 DIAGNOSIS — E348 Other specified endocrine disorders: Secondary | ICD-10-CM

## 2023-05-29 DIAGNOSIS — N182 Chronic kidney disease, stage 2 (mild): Secondary | ICD-10-CM | POA: Diagnosis not present

## 2023-05-29 DIAGNOSIS — J45909 Unspecified asthma, uncomplicated: Secondary | ICD-10-CM

## 2023-05-29 DIAGNOSIS — R7309 Other abnormal glucose: Secondary | ICD-10-CM

## 2023-05-29 DIAGNOSIS — K219 Gastro-esophageal reflux disease without esophagitis: Secondary | ICD-10-CM

## 2023-05-29 DIAGNOSIS — E782 Mixed hyperlipidemia: Secondary | ICD-10-CM | POA: Diagnosis not present

## 2023-05-29 DIAGNOSIS — Z23 Encounter for immunization: Secondary | ICD-10-CM | POA: Diagnosis not present

## 2023-05-29 DIAGNOSIS — E559 Vitamin D deficiency, unspecified: Secondary | ICD-10-CM | POA: Diagnosis not present

## 2023-05-29 DIAGNOSIS — I10 Essential (primary) hypertension: Secondary | ICD-10-CM | POA: Diagnosis not present

## 2023-05-29 DIAGNOSIS — Z1231 Encounter for screening mammogram for malignant neoplasm of breast: Secondary | ICD-10-CM

## 2023-05-29 DIAGNOSIS — F325 Major depressive disorder, single episode, in full remission: Secondary | ICD-10-CM

## 2023-05-29 DIAGNOSIS — R053 Chronic cough: Secondary | ICD-10-CM

## 2023-05-29 LAB — COMPREHENSIVE METABOLIC PANEL WITH GFR
ALT: 12 U/L (ref 0–35)
AST: 14 U/L (ref 0–37)
Albumin: 4.5 g/dL (ref 3.5–5.2)
Alkaline Phosphatase: 126 U/L — ABNORMAL HIGH (ref 39–117)
BUN: 14 mg/dL (ref 6–23)
CO2: 25 meq/L (ref 19–32)
Calcium: 9 mg/dL (ref 8.4–10.5)
Chloride: 105 meq/L (ref 96–112)
Creatinine, Ser: 0.95 mg/dL (ref 0.40–1.20)
GFR: 65.49 mL/min
Glucose, Bld: 85 mg/dL (ref 70–99)
Potassium: 3.9 meq/L (ref 3.5–5.1)
Sodium: 139 meq/L (ref 135–145)
Total Bilirubin: 0.4 mg/dL (ref 0.2–1.2)
Total Protein: 7.1 g/dL (ref 6.0–8.3)

## 2023-05-29 LAB — CBC WITH DIFFERENTIAL/PLATELET
Basophils Absolute: 0.1 K/uL (ref 0.0–0.1)
Basophils Relative: 1.1 % (ref 0.0–3.0)
Eosinophils Absolute: 0.1 K/uL (ref 0.0–0.7)
Eosinophils Relative: 1.3 % (ref 0.0–5.0)
HCT: 40.2 % (ref 36.0–46.0)
Hemoglobin: 13.5 g/dL (ref 12.0–15.0)
Lymphocytes Relative: 23.8 % (ref 12.0–46.0)
Lymphs Abs: 1.3 K/uL (ref 0.7–4.0)
MCHC: 33.7 g/dL (ref 30.0–36.0)
MCV: 93.3 fl (ref 78.0–100.0)
Monocytes Absolute: 0.3 K/uL (ref 0.1–1.0)
Monocytes Relative: 6.5 % (ref 3.0–12.0)
Neutro Abs: 3.6 K/uL (ref 1.4–7.7)
Neutrophils Relative %: 67.3 % (ref 43.0–77.0)
Platelets: 262 K/uL (ref 150.0–400.0)
RBC: 4.31 Mil/uL (ref 3.87–5.11)
RDW: 13.4 % (ref 11.5–15.5)
WBC: 5.4 K/uL (ref 4.0–10.5)

## 2023-05-29 LAB — LIPID PANEL
Cholesterol: 140 mg/dL (ref 0–200)
HDL: 43.2 mg/dL
LDL Cholesterol: 70 mg/dL (ref 0–99)
NonHDL: 96.36
Total CHOL/HDL Ratio: 3
Triglycerides: 133 mg/dL (ref 0.0–149.0)
VLDL: 26.6 mg/dL (ref 0.0–40.0)

## 2023-05-29 LAB — HEMOGLOBIN A1C: Hgb A1c MFr Bld: 5.4 % (ref 4.6–6.5)

## 2023-05-29 LAB — TSH: TSH: 3.33 u[IU]/mL (ref 0.35–5.50)

## 2023-05-29 LAB — VITAMIN D 25 HYDROXY (VIT D DEFICIENCY, FRACTURES): VITD: 81.33 ng/mL (ref 30.00–100.00)

## 2023-05-29 MED ORDER — PROMETHAZINE-DM 6.25-15 MG/5ML PO SYRP: 5.0000 mL | ORAL_SOLUTION | Freq: Four times a day (QID) | ORAL | 1 refills | Status: DC | PRN

## 2023-05-29 NOTE — Progress Notes (Signed)
 [facility] [address] [place] [address] Dept: [phone] Dept Fax: [phone]  New Patient Office Visit  Subjective:   Erin Nichols Dec 14, 1965 05/29/2023  Chief Complaint  Patient presents with   Establish Care   Cough    Chronic cough for years     HPI: Erin Nichols presents today to establish care at [facility]. Introduced to Publishing rights manager role and practice setting.  All questions answered.  Concerns: See below   Erin Nichols is a 58 year old female who presents to establish care after the recent passing of her previous physician, Dr. Oneta Rack  She has a history of hypertension, hyperlipidemia, asthma, acid reflux, CKD stage II, vitamin D deficiency, abnormal glucose level, and depression. She denies CP, SHOB, palpitations. She is compliant with medication regimens. She does complain of a chronic cough that has been ongoing for years, was found to have a hypersensitive larynx as the cause.   She is due for mammogram. She is post-menopausal, due for bone density screening. She does report several fractures in her feet throughout the years. She does take Calcium and Vit d supplement.   She is due for tdap booster.       04/08/2021    3:32 PM 04/08/2020    3:34 PM 09/22/2019    9:50 PM  Depression screen PHQ 2/9  Decreased Interest 0 0 0  Down, Depressed, Hopeless 0 0 0  PHQ - 2 Score 0 0 0  Altered sleeping  0   Tired, decreased energy  0   Change in appetite  0   Feeling bad or failure about yourself   0   Trouble concentrating  0   Moving slowly or fidgety/restless  0   Suicidal thoughts  0   PHQ-9 Score  0   Difficult doing work/chores  Not difficult at all      The following portions of the patient's history were reviewed and updated as appropriate: past medical history, past surgical history, family history, social history, allergies, medications, and  problem list.   Patient Active Problem List   Diagnosis Date Noted   CKD (chronic kidney disease) stage 2, GFR 60-89 ml/min 04/09/2020   Aortic atherosclerosis (HCC) -per CT 09/2018 01/02/2019   Overweight (BMI 25.0-29.9) 02/13/2017   Essential hypertension 11/08/2016   Hyperlipidemia, mixed 11/08/2016   Abnormal glucose 11/08/2016   Vitamin D deficiency 11/08/2016   Gastroesophageal reflux disease 11/08/2016   Asthma due to environmental allergies 11/08/2016   Major depression in remission (HCC) 11/08/2016   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    Hyperlipidemia 2012   Hypertension 2012   Vitamin D deficiency    Past Surgical History:  Procedure Laterality Date   COLONOSCOPY N/A 2015   screening at age 42 - Negative   FRACTURE SURGERY  1988   facial Fx , nose, Ankle both Arms   l ulnar nonunion  1992   NASAL SEPTUM SURGERY  1991   nonunion 5th metatarsal  2008   r sub talar fusion  2008   redo subtalar fusion  2013   rt ankle surg for non union  2013   SPINE SURGERY  2011   Cx5 - Cx6 fusion w/plate/screws   TONSILLECTOMY AND ADENOIDECTOMY  1972   Family History  Problem Relation Age of Onset   Diabetes Mother    Leukemia Mother    CAD Father    Cirrhosis Father    Alcohol abuse Father    Cirrhosis Sister        hep C, alcohol, drug user   Hypertension Brother    Heart disease Brother    Heart failure Brother    Stroke Maternal Grandmother     Current Outpatient Medications:    albuterol (PROAIR HFA) 108 (90 Base) MCG/ACT inhaler, Use  2 inhalations  15 minutes  apart with a spacer every 4 hours as Needed to Rescue Asthma, Disp: 48 g, Rfl: 3   aspirin 81 MG chewable tablet, Chew by mouth daily., Disp: , Rfl:    atorvastatin (LIPITOR) 20 MG tablet, TAKE 1 TABLET BY MOUTH DAILY FOR CHOLESTEROL, Disp: 90 tablet, Rfl: 1   beclomethasone  (QVAR REDIHALER) 40 MCG/ACT inhaler, Inhale 1 puff into the lungs 2 (two) times daily., Disp: 1 each, Rfl: 3   buPROPion (WELLBUTRIN XL) 300 MG 24 hr tablet, TAKE 1 TABLET BY MOUTH EVERY MORNING FOR MOOD OR FOCUS OR CONCENTRATION, Disp: 90 tablet, Rfl: 3   calcium citrate (CALCITRATE - DOSED IN MG ELEMENTAL CALCIUM) 950 MG tablet, Take 200 mg of elemental calcium by mouth daily., Disp: , Rfl:    citalopram (CELEXA) 40 MG tablet, Take 1 tablet Daily for Anxiety & Mood, Disp: 90 tablet, Rfl: 3   conjugated estrogens (PREMARIN) vaginal cream, Insert 1/2 gram once nightly for 14 days, then 1/2 gram three nights a week, Disp: 90 g, Rfl: 12   cyclobenzaprine (FLEXERIL) 10 MG tablet, Take  1/2 to 1 tablet   2 to 3 x / day  ONLY  if needed for Muscle Spasm                                                                 /                                TAKE                                BY                                        MOUTH, Disp: 270 tablet, Rfl: 0   Fexofenadine HCl (ALLEGRA PO), Take by mouth., Disp: , Rfl:    ibuprofen (ADVIL) 600 MG tablet, Take 1 tablet (600 mg total) by mouth every 6 (six) hours as needed., Disp: 30 tablet, Rfl: 0   Multiple Vitamins-Minerals (MULTIVITAMIN ADULTS PO), Take by mouth daily., Disp: , Rfl:    phentermine (ADIPEX-P) 37.5 MG tablet, Take 1/2 to 1 tablet every morning for dieting & weightloss, Disp: 90 tablet, Rfl: 0   topiramate (TOPAMAX) 50 MG tablet, TAKE 1/2 TO 1 TABLET BY MOUTH TWICE DAILY AT SUPPERTIME AND BEDTIME FOR DIETING AND WEIGHT LOSS, Disp: 180 tablet, Rfl: 3   vitamin B-12 (CYANOCOBALAMIN) 100 MCG tablet, Take 100 mcg by mouth daily. , Disp: ,  Rfl:    Vitamin D, Ergocalciferol, (DRISDOL) 1.25 MG (50000 UNIT) CAPS capsule, TAKE 1 CAPSULE BY MOUTH EVERY 7 DAYS, Disp: 12 capsule, Rfl: 3   promethazine-dextromethorphan (PROMETHAZINE-DM) 6.25-15 MG/5ML syrup, Take 5 mLs by mouth 4 (four) times daily as needed for cough., Disp: 240 mL, Rfl: 1 No Known  Allergies  ROS: A complete ROS was performed with pertinent positives/negatives noted in the HPI. The remainder of the ROS are negative.   Objective:   Today's Vitals   05/29/23 1349  BP: 124/68  Pulse: 93  Temp: 98 F (36.7 C)  TempSrc: Temporal  SpO2: 96%  Weight: 171 lb (77.6 kg)  Height: 5\' 2"  (1.575 m)    GENERAL: Well-appearing, in NAD. Well nourished.  SKIN: Pink, warm and dry. No rash, lesion, ulceration, or ecchymoses.  NECK: Trachea midline. Full ROM w/o pain or tenderness. No lymphadenopathy.  RESPIRATORY: Chest wall symmetrical. Respirations even and non-labored. Breath sounds clear to auscultation bilaterally.  CARDIAC: S1, S2 present, regular rate and rhythm. Peripheral pulses 2+ bilaterally.  MSK: Muscle tone and strength appropriate for age.  EXTREMITIES: Without clubbing, cyanosis, or edema.  NEUROLOGIC: No motor or sensory deficits. Steady, even gait.  PSYCH/MENTAL STATUS: Alert, oriented x 3. Cooperative, appropriate mood and affect.   Health Maintenance Due  Topic Date Due   DTaP/Tdap/Td (2 - Tdap) 02/21/2022   MAMMOGRAM  04/07/2023    No results found for any visits on 05/29/23.  Assessment & Plan:  1. Essential hypertension (Primary) - CBC with Differential/Platelet - Comprehensive metabolic panel with GFR - TSH  2. Hyperlipidemia, mixed - Lipid panel  3. Asthma due to environmental allergies - well controlled on Qvar and Albuterol PRN   4. Gastroesophageal reflux disease, unspecified whether esophagitis present - stable   5. CKD (chronic kidney disease) stage 2, GFR 60-89 ml/min - Comprehensive metabolic panel with GFR  6. Abnormal glucose - Hemoglobin A1C  7. Vitamin D deficiency - VITAMIN D 25 Hydroxy (Vit-D Deficiency, Fractures)  8. Major depression in remission (HCC) - well controlled on Celexa 40mg  and Bupropion XL 300mg    9. Chronic cough - promethazine-dextromethorphan (PROMETHAZINE-DM) 6.25-15 MG/5ML syrup; Take 5 mLs by  mouth 4 (four) times daily as needed for cough.  Dispense: 240 mL; Refill: 1  10. Estradiol deficiency - DG Bone Density; Future  11. Breast cancer screening by mammogram - MM 3D SCREENING MAMMOGRAM BILATERAL BREAST; Future  12. Immunization due - Tdap vaccine greater than or equal to 7yo IM  Orders Placed This Encounter  Procedures   MM 3D SCREENING MAMMOGRAM BILATERAL BREAST    Standing Status:   Future    Expiration Date:   05/28/2024    Reason for Exam (SYMPTOM  OR DIAGNOSIS REQUIRED):   screening for breast cancer    [facility]    Is the patient pregnant?:   No   DG Bone Density    Standing Status:   Future    Expiration Date:   05/28/2024    Reason for Exam (SYMPTOM  OR DIAGNOSIS REQUIRED):   postmenopausal estrogen deficiency    Is the patient pregnant?:   No    Preferred imaging location?:   [facility]   Tdap vaccine greater than or equal to 7yo IM   CBC with Differential/Platelet   Comprehensive metabolic panel with GFR   TSH   Lipid panel   Hemoglobin A1C   VITAMIN D 25 Hydroxy (Vit-D Deficiency, Fractures)   Meds ordered  this encounter  Medications   promethazine-dextromethorphan (PROMETHAZINE-DM) 6.25-15 MG/5ML syrup    Sig: Take 5 mLs by mouth 4 (four) times daily as needed for cough.    Dispense:  240 mL    Refill:  1    Supervising Provider:   Garnette Gunner [[id number]]    Return in about 4 months (around 09/28/2023) for Annual Physical Exam with fasting lab work.   Salvatore Decent, FNP

## 2023-05-31 ENCOUNTER — Encounter: Payer: Self-pay | Admitting: Internal Medicine

## 2023-05-31 ENCOUNTER — Other Ambulatory Visit: Payer: Self-pay | Admitting: Internal Medicine

## 2023-05-31 DIAGNOSIS — F325 Major depressive disorder, single episode, in full remission: Secondary | ICD-10-CM

## 2023-05-31 DIAGNOSIS — E782 Mixed hyperlipidemia: Secondary | ICD-10-CM

## 2023-05-31 DIAGNOSIS — R053 Chronic cough: Secondary | ICD-10-CM

## 2023-05-31 DIAGNOSIS — Z1231 Encounter for screening mammogram for malignant neoplasm of breast: Secondary | ICD-10-CM

## 2023-05-31 DIAGNOSIS — E2839 Other primary ovarian failure: Secondary | ICD-10-CM

## 2023-05-31 DIAGNOSIS — I10 Essential (primary) hypertension: Secondary | ICD-10-CM

## 2023-05-31 DIAGNOSIS — R7309 Other abnormal glucose: Secondary | ICD-10-CM

## 2023-05-31 DIAGNOSIS — N182 Chronic kidney disease, stage 2 (mild): Secondary | ICD-10-CM

## 2023-05-31 DIAGNOSIS — E348 Other specified endocrine disorders: Secondary | ICD-10-CM

## 2023-05-31 DIAGNOSIS — E559 Vitamin D deficiency, unspecified: Secondary | ICD-10-CM

## 2023-05-31 DIAGNOSIS — K219 Gastro-esophageal reflux disease without esophagitis: Secondary | ICD-10-CM

## 2023-05-31 DIAGNOSIS — Z23 Encounter for immunization: Secondary | ICD-10-CM

## 2023-05-31 DIAGNOSIS — J45909 Unspecified asthma, uncomplicated: Secondary | ICD-10-CM

## 2023-06-12 ENCOUNTER — Encounter: Payer: Self-pay | Admitting: Internal Medicine

## 2023-06-12 DIAGNOSIS — E782 Mixed hyperlipidemia: Secondary | ICD-10-CM

## 2023-06-13 MED ORDER — ATORVASTATIN CALCIUM 20 MG PO TABS: ORAL_TABLET | ORAL | 3 refills | Status: AC

## 2023-07-27 ENCOUNTER — Other Ambulatory Visit: Payer: Self-pay | Admitting: Internal Medicine

## 2023-07-27 DIAGNOSIS — R053 Chronic cough: Secondary | ICD-10-CM

## 2023-09-11 ENCOUNTER — Other Ambulatory Visit: Payer: Self-pay | Admitting: Internal Medicine

## 2023-09-11 DIAGNOSIS — R053 Chronic cough: Secondary | ICD-10-CM

## 2023-09-11 NOTE — Telephone Encounter (Unsigned)
 Copied from CRM [id number]. Topic: Clinical - Medication Refill >> Sep 11, 2023 10:27 AM Drema MATSU wrote: Medication: promethazine -dextromethorphan (PROMETHAZINE -DM) 6.25-15 MG/5ML syrup   Has the patient contacted their pharmacy? No (Agent: If no, request that the patient contact the pharmacy for the refill. If patient does not wish to contact the pharmacy document the reason why and proceed with request.) (Agent: If yes, when and what did the pharmacy advise?)  This is the patient's preferred pharmacy:  [facility] (behind clinic) Is this the correct pharmacy for this prescription? Yes If no, delete pharmacy and type the correct one.   Has the prescription been filled recently? Yes  Is the patient out of the medication? No  Has the patient been seen for an appointment in the last year OR does the patient have an upcoming appointment? Yes  Can we respond through MyChart? Yes  Agent: Please be advised that Rx refills may take up to 3 business days. We ask that you follow-up with your pharmacy.

## 2023-09-11 NOTE — Telephone Encounter (Signed)
 Patient returned call and stated a refill is needed for her promethazine -dextromethorphan (PROMETHAZINE -DM) 6.25-15 MG/5ML syrup.

## 2023-09-11 NOTE — Telephone Encounter (Signed)
 Left voicemail asking patient to call the office if refill is needed

## 2023-09-11 NOTE — Telephone Encounter (Signed)
 Copied from CRM [id number]. Topic: Clinical - Medication Question >> Sep 11, 2023 10:26 AM Drema MATSU wrote: Reason for CRM: Patient stated that she wants all of her prescriptions to go to [facility] from now on. [facility] should be the only pharmacy on list.

## 2023-09-12 MED ORDER — PROMETHAZINE-DM 6.25-15 MG/5ML PO SYRP
5.0000 mL | ORAL_SOLUTION | Freq: Four times a day (QID) | ORAL | 1 refills | Status: DC | PRN
Start: 2023-09-12 — End: 2023-10-25

## 2023-09-19 ENCOUNTER — Encounter: Payer: Self-pay | Admitting: Internal Medicine

## 2023-09-19 DIAGNOSIS — F32A Depression, unspecified: Secondary | ICD-10-CM

## 2023-09-19 DIAGNOSIS — M542 Cervicalgia: Secondary | ICD-10-CM

## 2023-09-21 MED ORDER — PHENTERMINE HCL 37.5 MG PO TABS: ORAL_TABLET | ORAL | 2 refills | Status: DC

## 2023-09-21 MED ORDER — CYCLOBENZAPRINE HCL 10 MG PO TABS: ORAL_TABLET | ORAL | 1 refills | Status: DC

## 2023-09-21 MED ORDER — BUPROPION HCL ER (XL) 300 MG PO TB24: ORAL_TABLET | ORAL | 3 refills | Status: AC

## 2023-09-28 ENCOUNTER — Encounter: Admitting: Internal Medicine

## 2023-10-04 ENCOUNTER — Encounter: Payer: Self-pay | Admitting: Internal Medicine

## 2023-10-04 ENCOUNTER — Ambulatory Visit (INDEPENDENT_AMBULATORY_CARE_PROVIDER_SITE_OTHER): Admitting: Internal Medicine

## 2023-10-04 VITALS — BP 116/84 | HR 87 | Temp 98.2°F | Ht 62.0 in | Wt 170.0 lb

## 2023-10-04 DIAGNOSIS — Z0000 Encounter for general adult medical examination without abnormal findings: Secondary | ICD-10-CM | POA: Diagnosis not present

## 2023-10-04 DIAGNOSIS — K146 Glossodynia: Secondary | ICD-10-CM

## 2023-10-04 DIAGNOSIS — E559 Vitamin D deficiency, unspecified: Secondary | ICD-10-CM | POA: Diagnosis not present

## 2023-10-04 NOTE — Progress Notes (Signed)
 Subjective:   Erin Nichols November 17, 1965  10/04/2023   CC: Chief Complaint  Patient presents with   Annual Exam    Last ate @11 :30  Burning mouth syndrome -should have B-12 level checked per dentist    Cough    Ongoing     HPI: Erin Nichols is a 58 y.o. female who presents for a routine health maintenance exam.  Labs collected at time of visit.   Discussed the use of AI scribe software for clinical note transcription with the patient, who gave verbal consent to proceed.  She has been experiencing burning mouth syndrome for an unspecified duration. Her dentist is uncertain of the cause but suggested a possible link to vitamin B12 deficiency. She uses biotin mouthwash, which provides some relief, and frequently drinks water to alleviate symptoms. No increased stress or recent life changes are noted that could contribute to the condition.   No history of smoking, chewing tobacco, vaping, alcohol, or drug use. No history of acid reflux symptoms such as heartburn or burping, and no chest pain or difficulty breathing.  She does use a Qvar  inhaler daily for her asthma.  She states that she does not swish/gargle and spit with water after using inhaler.   She would also like her vitamin D  checked today.  HEALTH SCREENINGS: - Pap smear: up to date - Mammogram (40+): , UTD ,scheduled for December 2025  - Colonoscopy (45+): Up to date  - Bone Density (65+): scheduled for December  - Lung CA screening with low-dose CT:  Not applicable Adults age 44-80 who are current cigarette smokers or quit within the last 15 years. Must have 20 pack year history.   Depression and Anxiety Screen done today and results listed below:     10/04/2023    2:50 PM 05/29/2023    2:25 PM 04/08/2021    3:32 PM 04/08/2020    3:34 PM 09/22/2019    9:50 PM  Depression screen PHQ 2/9  Decreased Interest 0 0 0 0 0  Down, Depressed, Hopeless 0 0 0 0 0  PHQ - 2 Score 0 0 0 0 0  Altered sleeping 0 2  0    Tired, decreased energy 0 2  0   Change in appetite 0 0  0   Feeling bad or failure about yourself  0 0  0   Trouble concentrating 0 0  0   Moving slowly or fidgety/restless 0 0  0   Suicidal thoughts 0 0  0   PHQ-9 Score 0 4  0   Difficult doing work/chores Not difficult at all Not difficult at all  Not difficult at all       10/04/2023    2:51 PM 05/29/2023    2:25 PM  GAD 7 : Generalized Anxiety Score  Nervous, Anxious, on Edge 1 1  Control/stop worrying 0 1  Worry too much - different things 0 1  Trouble relaxing 0 1  Restless 0 0  Easily annoyed or irritable 0 0  Afraid - awful might happen 0 0  Total GAD 7 Score 1 4  Anxiety Difficulty Not difficult at all Not difficult at all    IMMUNIZATIONS: - Tdap: Tetanus vaccination status reviewed: last tetanus booster within 10 years. - HPV: Not applicable - Influenza: Postponed to flu season - Prevnar 20: Up to date - Zostavax (50+): declined - she will return at alternate date   Past medical history, surgical history, medications, allergies, family history  and social history reviewed with patient today and changes made to appropriate areas of the chart.   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    Hyperlipidemia 2012   Hypertension 2012   Vitamin D  deficiency     Past Surgical History:  Procedure Laterality Date   COLONOSCOPY N/A 2015   screening at age 46 - Negative   FRACTURE SURGERY  1988   facial Fx , nose, Ankle both Arms   l ulnar nonunion  1992   NASAL SEPTUM SURGERY  1991   nonunion 5th metatarsal  2008   r sub talar fusion  2008   redo subtalar fusion  2013   rt ankle surg for non union  2013   SPINE SURGERY  2011   Cx5 - Cx6 fusion w/plate/screws   TONSILLECTOMY AND ADENOIDECTOMY  1972    Current Outpatient Medications on File Prior to Visit  Medication Sig   albuterol   (PROAIR  HFA) 108 (90 Base) MCG/ACT inhaler Use  2 inhalations  15 minutes  apart with a spacer every 4 hours as Needed to Rescue Asthma   aspirin 81 MG chewable tablet Chew by mouth daily.   atorvastatin  (LIPITOR) 20 MG tablet TAKE 1 TABLET BY MOUTH DAILY FOR CHOLESTEROL   beclomethasone (QVAR  REDIHALER) 40 MCG/ACT inhaler Inhale 1 puff into the lungs 2 (two) times daily.   buPROPion  (WELLBUTRIN  XL) 300 MG 24 hr tablet TAKE 1 TABLET BY MOUTH EVERY MORNING FOR MOOD OR FOCUS OR CONCENTRATION   calcium  citrate (CALCITRATE - DOSED IN MG ELEMENTAL CALCIUM ) 950 MG tablet Take 200 mg of elemental calcium  by mouth daily.   citalopram  (CELEXA ) 40 MG tablet Take 1 tablet Daily for Anxiety & Mood   conjugated estrogens  (PREMARIN ) vaginal cream Insert 1/2 gram once nightly for 14 days, then 1/2 gram three nights a week   cyclobenzaprine  (FLEXERIL ) 10 MG tablet Take 1/2 to 1 tablet 2 times a day as needed for muscle spasms.   Fexofenadine HCl (ALLEGRA PO) Take by mouth.   ibuprofen  (ADVIL ) 600 MG tablet Take 1 tablet (600 mg total) by mouth every 6 (six) hours as needed.   Multiple Vitamins-Minerals (MULTIVITAMIN ADULTS PO) Take by mouth daily.   phentermine  (ADIPEX-P ) 37.5 MG tablet Take 1/2 to 1 tablet every morning for dieting & weightloss   promethazine -dextromethorphan (PROMETHAZINE -DM) 6.25-15 MG/5ML syrup Take 5 mLs by mouth 4 (four) times daily as needed for cough.   topiramate  (TOPAMAX ) 50 MG tablet TAKE 1/2 TO 1 TABLET BY MOUTH TWICE DAILY AT SUPPERTIME AND BEDTIME FOR DIETING AND WEIGHT LOSS   vitamin B-12 (CYANOCOBALAMIN ) 100 MCG tablet Take 100 mcg by mouth daily.    Vitamin D , Ergocalciferol , (DRISDOL ) 1.25 MG (50000 UNIT) CAPS capsule TAKE 1 CAPSULE BY MOUTH EVERY 7 DAYS   No current facility-administered medications on file prior to visit.    No Known Allergies   Social History   Socioeconomic History   Marital status: Married    Spouse name: Not on file   Number of children: Not on  file   Years of education: Not on file   Highest education level: Master's degree (e.g., MA, MS, MEng, MEd, MSW, MBA)  Occupational History   Not on file  Tobacco Use   Smoking status: Never   Smokeless tobacco: Never  Vaping Use   Vaping status:  Never Used  Substance and Sexual Activity   Alcohol use: No   Drug use: No   Sexual activity: Yes    Partners: Male    Birth control/protection: Post-menopausal  Other Topics Concern   Not on file  Social History Narrative   Not on file   Social Drivers of Health   Financial Resource Strain: Low Risk  (10/01/2023)   Overall Financial Resource Strain (CARDIA)    Difficulty of Paying Living Expenses: Not hard at all  Food Insecurity: No Food Insecurity (10/01/2023)   Hunger Vital Sign    Worried About Running Out of Food in the Last Year: Never true    Ran Out of Food in the Last Year: Never true  Transportation Needs: No Transportation Needs (10/01/2023)   PRAPARE - Administrator, Civil Service (Medical): No    Lack of Transportation (Non-Medical): No  Physical Activity: Inactive (10/01/2023)   Exercise Vital Sign    Days of Exercise per Week: 0 days    Minutes of Exercise per Session: Not on file  Stress: No Stress Concern Present (10/01/2023)   [employer] of Occupational Health - Occupational Stress Questionnaire    Feeling of Stress: Only a little  Social Connections: Moderately Integrated (10/01/2023)   Social Connection and Isolation Panel    Frequency of Communication with Friends and Family: More than three times a week    Frequency of Social Gatherings with Friends and Family: More than three times a week    Attends Religious Services: 1 to 4 times per year    Active Member of [employer] or Organizations: No    Attends Engineer, structural: Not on file    Marital Status: Married  Catering manager Violence: Not on file   Social History   Tobacco Use  Smoking Status Never  Smokeless Tobacco Never    Social History   Substance and Sexual Activity  Alcohol Use No    Family History  Problem Relation Age of Onset   Diabetes Mother    Leukemia Mother    CAD Father    Cirrhosis Father    Alcohol abuse Father    Cirrhosis Sister        hep C, alcohol, drug user   Hypertension Brother    Heart disease Brother    Heart failure Brother    Stroke Maternal Grandmother      ROS: Denies fever, fatigue, unexplained weight loss/gain, hearing or vision changes, cardiac or respiratory complaints. Denies neurological deficits, musculoskeletal complaints, gastrointestinal or genitourinary complaints, mental health complaints, and skin changes.   Objective:   Today's Vitals   10/04/23 1447  BP: 116/84  Pulse: 87  Temp: 98.2 F (36.8 C)  TempSrc: Temporal  SpO2: 98%  Weight: 170 lb (77.1 kg)  Height: 5' 2 (1.575 m)    GENERAL APPEARANCE: Well-appearing, in NAD. Well nourished.  SKIN: Pink, warm and dry. Turgor normal. No rash, lesion, ulceration, or ecchymoses. Hair evenly distributed.  HEENT: HEAD: Normocephalic.  EYES: PERRLA. EOMI. Lids intact w/o defect. Sclera white, Conjunctiva pink w/o exudate.  EARS: External ear w/o redness, swelling, masses or lesions. EAC clear. TM's intact, translucent w/o bulging, appropriate landmarks visualized. Appropriate acuity to conversational tones.  NOSE: Septum midline w/o deformity. Nares patent, mucosa pink and non-inflamed w/o drainage.  THROAT: Uvula midline. Oropharynx clear. Tonsils absent. Oral mucosa pink and moist.  NECK: Supple, Trachea midline. Full ROM w/o pain or tenderness. No lymphadenopathy. Thyroid  non-tender w/o enlargement or  palpable masses.  BREASTS: Breasts pendulous, symmetrical, and w/o palpable masses. Nipples everted and w/o discharge. No rash or skin retraction. No axillary or supraclavicular lymphadenopathy.  RESPIRATORY: Chest wall symmetrical w/o masses. Respirations even and non-labored. Breath sounds clear to  auscultation bilaterally. No wheezes, rales, rhonchi, or crackles. CARDIAC: S1, S2 present, regular rate and rhythm. No gallops, murmurs, rubs, or clicks. No carotid bruits. Capillary refill <2 seconds. Peripheral pulses 2+ bilaterally. GI: Abdomen soft w/o distention. Normoactive bowel sounds. No palpable masses or tenderness. No guarding or rebound tenderness. Liver and spleen w/o tenderness or enlargement. No CVA tenderness.  MSK: Muscle tone and strength appropriate for age, w/o atrophy or abnormal movement.  EXTREMITIES: Active ROM intact, w/o tenderness, crepitus, or contracture. No obvious joint deformities or effusions. No clubbing, edema, or cyanosis.  NEUROLOGIC: CN's II-XII intact. Motor strength symmetrical with no obvious weakness. No sensory deficits.Steady, even gait.  PSYCH/MENTAL STATUS: Alert, oriented x 3. Cooperative, appropriate mood and affect.     Assessment & Plan:  Annual physical exam -     CBC with Differential/Platelet; Future -     Comprehensive metabolic panel with GFR; Future -     Lipid panel; Future -     TSH; Future  Burning mouth syndrome -     B12 and Folate Panel; Future -     IBC + Ferritin; Future -   Advise swish/gargle and spit with water after using qvar  inhaler, this could be causing the burning sensation in her mouth due to steroid use.  Vitamin D  deficiency -     VITAMIN D  25 Hydroxy (Vit-D Deficiency, Fractures); Future    Orders Placed This Encounter  Procedures   CBC with Differential/Platelet    Standing Status:   Future    Expiration Date:   04/05/2024   Comprehensive metabolic panel with GFR    Standing Status:   Future    Expiration Date:   04/05/2024   Lipid panel    Standing Status:   Future    Expiration Date:   04/05/2024   TSH    Standing Status:   Future    Expiration Date:   04/05/2024   B12 and Folate Panel    Standing Status:   Future    Expiration Date:   10/03/2024   IBC + Ferritin    Standing Status:   Future     Expiration Date:   10/03/2024   VITAMIN D  25 Hydroxy (Vit-D Deficiency, Fractures)    Standing Status:   Future    Expiration Date:   10/03/2024    PATIENT COUNSELING:  - Encouraged a healthy well-balanced diet. Patient may adjust caloric intake to maintain or achieve ideal body weight. May reduce intake of dietary saturated fat and total fat and have adequate dietary potassium and calcium  preferably from fresh fruits, vegetables, and low-fat dairy products.   - Advised to avoid cigarette smoking. - Discussed with the patient that most people either abstain from alcohol or drink within safe limits (<=14/week and <=4 drinks/occasion for males, <=7/weeks and <= 3 drinks/occasion for females) and that the risk for alcohol disorders and other health effects rises proportionally with the number of drinks per week and how often a drinker exceeds daily limits. - Discussed cessation/primary prevention of drug use and availability of treatment for abuse.  - Discussed sexually transmitted diseases, avoidance of unintended pregnancy and contraceptive alternatives.  - Stressed the importance of regular exercise - Injury prevention: Discussed safety belts, safety helmets, smoke  detector, smoking near bedding or upholstery.  - Dental health: Discussed importance of regular tooth brushing, flossing, and dental visits.   NEXT PREVENTATIVE PHYSICAL DUE IN 1 YEAR.  Return in about 6 months (around 04/05/2024).  Rosina Senters, FNP

## 2023-10-11 ENCOUNTER — Other Ambulatory Visit (INDEPENDENT_AMBULATORY_CARE_PROVIDER_SITE_OTHER)

## 2023-10-11 DIAGNOSIS — K146 Glossodynia: Secondary | ICD-10-CM | POA: Diagnosis not present

## 2023-10-11 DIAGNOSIS — Z0000 Encounter for general adult medical examination without abnormal findings: Secondary | ICD-10-CM | POA: Diagnosis not present

## 2023-10-11 DIAGNOSIS — E559 Vitamin D deficiency, unspecified: Secondary | ICD-10-CM | POA: Diagnosis not present

## 2023-10-11 LAB — COMPREHENSIVE METABOLIC PANEL WITH GFR
ALT: 13 U/L (ref 0–35)
AST: 15 U/L (ref 0–37)
Albumin: 4.1 g/dL (ref 3.5–5.2)
Alkaline Phosphatase: 122 U/L — ABNORMAL HIGH (ref 39–117)
BUN: 11 mg/dL (ref 6–23)
CO2: 28 meq/L (ref 19–32)
Calcium: 8.6 mg/dL (ref 8.4–10.5)
Chloride: 107 meq/L (ref 96–112)
Creatinine, Ser: 0.91 mg/dL (ref 0.40–1.20)
GFR: 68.78 mL/min (ref >60.00)
Glucose, Bld: 89 mg/dL (ref 70–99)
Potassium: 3.6 meq/L (ref 3.5–5.1)
Sodium: 141 meq/L (ref 135–145)
Total Bilirubin: 0.4 mg/dL (ref 0.2–1.2)
Total Protein: 7.1 g/dL (ref 6.0–8.3)

## 2023-10-11 LAB — CBC WITH DIFFERENTIAL/PLATELET
Basophils Absolute: 0.1 K/uL (ref 0.0–0.1)
Basophils Relative: 1.3 % (ref 0.0–3.0)
Eosinophils Absolute: 0.1 K/uL (ref 0.0–0.7)
Eosinophils Relative: 2.4 % (ref 0.0–5.0)
HCT: 39 % (ref 36.0–46.0)
Hemoglobin: 13.1 g/dL (ref 12.0–15.0)
Lymphocytes Relative: 25.5 % (ref 12.0–46.0)
Lymphs Abs: 1.1 K/uL (ref 0.7–4.0)
MCHC: 33.5 g/dL (ref 30.0–36.0)
MCV: 91.4 fl (ref 78.0–100.0)
Monocytes Absolute: 0.3 K/uL (ref 0.1–1.0)
Monocytes Relative: 6.4 % (ref 3.0–12.0)
Neutro Abs: 2.8 K/uL (ref 1.4–7.7)
Neutrophils Relative %: 64.4 % (ref 43.0–77.0)
Platelets: 231 K/uL (ref 150.0–400.0)
RBC: 4.27 Mil/uL (ref 3.87–5.11)
RDW: 13.6 % (ref 11.5–15.5)
WBC: 4.4 K/uL (ref 4.0–10.5)

## 2023-10-11 LAB — IBC + FERRITIN
Ferritin: 39.7 ng/mL (ref 10.0–291.0)
Iron: 90 ug/dL (ref 42–145)
Saturation Ratios: 27.7 % (ref 20.0–50.0)
TIBC: 324.8 ug/dL (ref 250.0–450.0)
Transferrin: 232 mg/dL (ref 212.0–360.0)

## 2023-10-11 LAB — B12 AND FOLATE PANEL
Folate: 17.6 ng/mL (ref >5.9)
Vitamin B-12: >1500 pg/mL — ABNORMAL HIGH (ref 211–911)

## 2023-10-11 LAB — LIPID PANEL
Cholesterol: 142 mg/dL (ref 0–200)
HDL: 47.9 mg/dL (ref >39.00)
LDL Cholesterol: 73 mg/dL (ref 0–99)
NonHDL: 93.74
Total CHOL/HDL Ratio: 3
Triglycerides: 104 mg/dL (ref 0.0–149.0)
VLDL: 20.8 mg/dL (ref 0.0–40.0)

## 2023-10-11 LAB — VITAMIN D 25 HYDROXY (VIT D DEFICIENCY, FRACTURES): VITD: 69.7 ng/mL (ref 30.00–100.00)

## 2023-10-11 LAB — TSH: TSH: 2.78 u[IU]/mL (ref 0.35–5.50)

## 2023-10-12 ENCOUNTER — Encounter: Payer: Self-pay | Admitting: Internal Medicine

## 2023-10-12 ENCOUNTER — Ambulatory Visit: Payer: Self-pay | Admitting: Internal Medicine

## 2023-10-12 DIAGNOSIS — J45909 Unspecified asthma, uncomplicated: Secondary | ICD-10-CM

## 2023-10-12 DIAGNOSIS — J45901 Unspecified asthma with (acute) exacerbation: Secondary | ICD-10-CM

## 2023-10-23 ENCOUNTER — Other Ambulatory Visit: Payer: Self-pay | Admitting: Internal Medicine

## 2023-10-23 DIAGNOSIS — R053 Chronic cough: Secondary | ICD-10-CM

## 2023-10-24 MED ORDER — ALBUTEROL SULFATE HFA 108 (90 BASE) MCG/ACT IN AERS: INHALATION_SPRAY | RESPIRATORY_TRACT | 3 refills | Status: AC

## 2023-10-24 NOTE — Telephone Encounter (Signed)
 Rx sent to pharmacy

## 2023-10-29 ENCOUNTER — Encounter: Payer: Self-pay | Admitting: Internal Medicine

## 2023-10-30 ENCOUNTER — Telehealth: Payer: Self-pay

## 2023-10-30 DIAGNOSIS — F32A Depression, unspecified: Secondary | ICD-10-CM

## 2023-10-30 DIAGNOSIS — E66811 Obesity, class 1: Secondary | ICD-10-CM

## 2023-10-30 MED ORDER — CITALOPRAM HYDROBROMIDE 40 MG PO TABS: ORAL_TABLET | ORAL | 3 refills | Status: AC

## 2023-10-30 MED ORDER — TOPIRAMATE 50 MG PO TABS: ORAL_TABLET | ORAL | 3 refills | Status: AC

## 2023-10-30 NOTE — Telephone Encounter (Signed)
 Rx sent to the pharmacy.

## 2023-12-15 ENCOUNTER — Other Ambulatory Visit: Payer: Self-pay | Admitting: Internal Medicine

## 2023-12-15 DIAGNOSIS — R053 Chronic cough: Secondary | ICD-10-CM

## 2024-01-07 ENCOUNTER — Other Ambulatory Visit: Payer: Self-pay | Admitting: Internal Medicine

## 2024-01-07 DIAGNOSIS — M542 Cervicalgia: Secondary | ICD-10-CM

## 2024-01-07 DIAGNOSIS — E66811 Obesity, class 1: Secondary | ICD-10-CM

## 2024-01-08 NOTE — Telephone Encounter (Signed)
 Two refill requests. Phentermine  37.5mg  and Cyclobenzaprine  10mg .  LOV 10/04/2023 FOV 04/05/2024 Last refill 09/21/2023 Both medications are pending for approval.

## 2024-01-09 ENCOUNTER — Telehealth: Payer: Self-pay

## 2024-01-09 DIAGNOSIS — E66811 Obesity, class 1: Secondary | ICD-10-CM

## 2024-01-09 MED ORDER — PHENTERMINE HCL 37.5 MG PO TABS: ORAL_TABLET | ORAL | 2 refills | Status: AC

## 2024-01-09 NOTE — Telephone Encounter (Signed)
 Copied from CRM [id number]. Topic: Clinical - Medication Question >> Jan 09, 2024 12:30 PM Alexandria E wrote: Reason for CRM: Recardo from [facility] pharmacy called in stating that the way their system is being the prescription for phentermine  (ADIPEX-P ) 37.5 MG tablet cannot be filled. Recardo stated that they will need a new prescription sent over. Callback number [phone].

## 2024-01-09 NOTE — Addendum Note (Signed)
 Addended by: BILLY KNEE on: 01/09/2024 03:52 PM   Modules accepted: Orders

## 2024-01-15 NOTE — Addendum Note (Signed)
 Addended by: Charlee Squibb on: 01/15/2024 02:08 PM   Modules accepted: Orders

## 2024-01-15 NOTE — Telephone Encounter (Signed)
 Refill needed for phentermine  (ADIPEX-P ) 37.5 MG tablet   LOV 10/04/2023 FOV 04/05/2024 LRF 01/09/24

## 2024-01-16 NOTE — Telephone Encounter (Signed)
 I already sent this in on 11/18.

## 2024-01-16 NOTE — Telephone Encounter (Signed)
 spoke with pt 11/25 pt stated she was good.

## 2024-01-28 ENCOUNTER — Other Ambulatory Visit: Payer: Self-pay | Admitting: Internal Medicine

## 2024-01-28 DIAGNOSIS — R053 Chronic cough: Secondary | ICD-10-CM

## 2024-01-30 NOTE — Telephone Encounter (Signed)
 Refill request received for Promethazine -DM 6.25-15mg /mL  FOV:04/05/2024 LOV:10/04/2023 Last refill:12/18/2023 Medication is pending your approval.

## 2024-02-05 ENCOUNTER — Ambulatory Visit

## 2024-02-05 ENCOUNTER — Other Ambulatory Visit: Payer: Self-pay | Admitting: Internal Medicine

## 2024-02-05 ENCOUNTER — Other Ambulatory Visit

## 2024-02-05 ENCOUNTER — Ambulatory Visit
Admission: RE | Admit: 2024-02-05 | Discharge: 2024-02-05 | Disposition: A | Discharge status: Home / Self Care | Source: Ambulatory Visit | Attending: Internal Medicine | Admitting: Internal Medicine

## 2024-02-05 DIAGNOSIS — Z1231 Encounter for screening mammogram for malignant neoplasm of breast: Secondary | ICD-10-CM

## 2024-02-05 DIAGNOSIS — R053 Chronic cough: Secondary | ICD-10-CM

## 2024-02-06 ENCOUNTER — Ambulatory Visit (HOSPITAL_BASED_OUTPATIENT_CLINIC_OR_DEPARTMENT_OTHER)
Admission: RE | Admit: 2024-02-06 | Discharge: 2024-02-06 | Disposition: A | Source: Ambulatory Visit | Attending: Internal Medicine | Admitting: Internal Medicine

## 2024-02-06 DIAGNOSIS — E2839 Other primary ovarian failure: Secondary | ICD-10-CM

## 2024-02-09 ENCOUNTER — Ambulatory Visit: Payer: Self-pay | Admitting: Internal Medicine

## 2024-02-09 NOTE — Telephone Encounter (Signed)
 Pt is needing refill for promethazine -dextromethorphan (PROMETHAZINE -DM) 6.25-15 MG/5ML syrup   LOV 10/04/23 FOV not scheduled  LRF 12/18/23

## 2024-02-27 ENCOUNTER — Ambulatory Visit: Payer: Self-pay | Admitting: Internal Medicine

## 2024-02-28 ENCOUNTER — Ambulatory Visit

## 2024-02-28 ENCOUNTER — Ambulatory Visit: Payer: Self-pay

## 2024-02-28 ENCOUNTER — Ambulatory Visit: Admitting: Family Medicine

## 2024-02-28 NOTE — Telephone Encounter (Signed)
 FYI Only or Action Required?: FYI only for provider: appointment scheduled on 02/28/24.  Patient was last seen in primary care on 10/04/2023 by Billy Knee, FNP.  Called Nurse Triage reporting Arm Pain.  Symptoms began several days ago.  Interventions attempted: OTC medications: Tylenol .  Symptoms are: unchanged.  Triage Disposition: See PCP When Office is Open (Within 3 Days)  Patient/caregiver understands and will follow disposition?: Yes              Copied from CRM #[id number]. Topic: Clinical - Red Word Triage >> Feb 28, 2024  8:12 AM Eva FALCON wrote: Red Word that prompted transfer to Nurse Triage: fell over the weekend, hurt right arm, thought she pulled muscle, but is now having severe pain especially when doing certain things, thinks she might have broken her arm. Reason for Disposition  [1] MODERATE pain (e.g., interferes with normal activities) AND [2] present > 3 days  Answer Assessment - Initial Assessment Questions 1. ONSET: When did the pain start?     Last weekend    2. LOCATION: Where is the pain located?     Right arm    3. PAIN: How bad is the pain? (Scale 0-10; or none, mild, moderate, severe)     Moderate with movement, without movement pain is mild    4. WORK OR EXERCISE: Has there been any recent work or exercise that involved this part of the body?     No    5. CAUSE: What do you think is causing the arm pain?      Clemens last weekend    6. OTHER SYMPTOMS: Do you have any other symptoms? (e.g., neck pain, swelling, rash, fever, numbness, weakness)     No other symptoms notes besides bruising to the arm.       Patient called in to triage with complaints of right arm pain related to a fall over the weekend. She was was out with family during that time, and did not seek care.  This has been ongoing since last weekend.  The patient stated thought she pulled muscle, but is now having severe pain especially with certain  movements.Soreness and bruising noted.  For home care, the patient is taking Tylenol .  Appointment scheduled for further evaluation; Patient agrees with the plan of care, and will reach out if symptoms worsen or persist.  Protocols used: Arm Pain-A-AH

## 2024-02-28 NOTE — Telephone Encounter (Signed)
 I called and spoke with patient and advised her of urgent care and patient said that they were filed and had no availability. I asked patient to hold while I spoke with front office to assist with getting patient scheduled elsewhere. Patient was transferred to front staff to help with scheduling.

## 2024-02-29 ENCOUNTER — Ambulatory Visit: Admitting: Student

## 2024-03-01 ENCOUNTER — Encounter (HOSPITAL_COMMUNITY): Payer: Self-pay

## 2024-03-01 ENCOUNTER — Emergency Department (HOSPITAL_COMMUNITY)

## 2024-03-01 ENCOUNTER — Other Ambulatory Visit: Payer: Self-pay

## 2024-03-01 ENCOUNTER — Emergency Department (HOSPITAL_COMMUNITY)
Admission: EM | Admit: 2024-03-01 | Discharge: 2024-03-01 | Disposition: A | Discharge status: Home / Self Care | Attending: Emergency Medicine | Admitting: Emergency Medicine

## 2024-03-01 DIAGNOSIS — W19XXXA Unspecified fall, initial encounter: Secondary | ICD-10-CM | POA: Insufficient documentation

## 2024-03-01 DIAGNOSIS — Z7982 Long term (current) use of aspirin: Secondary | ICD-10-CM | POA: Insufficient documentation

## 2024-03-01 DIAGNOSIS — I10 Essential (primary) hypertension: Secondary | ICD-10-CM | POA: Insufficient documentation

## 2024-03-01 DIAGNOSIS — S022XXA Fracture of nasal bones, initial encounter for closed fracture: Secondary | ICD-10-CM | POA: Diagnosis not present

## 2024-03-01 DIAGNOSIS — S0992XA Unspecified injury of nose, initial encounter: Secondary | ICD-10-CM | POA: Diagnosis present

## 2024-03-01 DIAGNOSIS — J45909 Unspecified asthma, uncomplicated: Secondary | ICD-10-CM | POA: Insufficient documentation

## 2024-03-01 DIAGNOSIS — S60512A Abrasion of left hand, initial encounter: Secondary | ICD-10-CM | POA: Insufficient documentation

## 2024-03-01 DIAGNOSIS — Z79899 Other long term (current) drug therapy: Secondary | ICD-10-CM | POA: Insufficient documentation

## 2024-03-01 MED ORDER — MORPHINE SULFATE (PF) 2 MG/ML IV SOLN
4.0000 mg | Freq: Once | INTRAVENOUS | Status: AC
  Administered 2024-03-01: 4 mg via INTRAMUSCULAR
  Filled 2024-03-01: qty 2

## 2024-03-01 MED ORDER — ONDANSETRON 4 MG PO TBDP
4.0000 mg | ORAL_TABLET | Freq: Once | ORAL | Status: AC
  Administered 2024-03-01: 4 mg via ORAL
  Filled 2024-03-01: qty 1

## 2024-03-01 MED ORDER — OXYCODONE-ACETAMINOPHEN 5-325 MG PO TABS: 1.0000 | ORAL_TABLET | Freq: Four times a day (QID) | ORAL | 0 refills | Status: AC | PRN

## 2024-03-01 MED ORDER — BACITRACIN ZINC 500 UNIT/GM EX OINT
TOPICAL_OINTMENT | Freq: Once | CUTANEOUS | Status: AC
  Administered 2024-03-01: 1 via TOPICAL
  Filled 2024-03-01: qty 0.9

## 2024-03-01 NOTE — ED Triage Notes (Signed)
 Pt arrives via [facility] from her workplace. Pt was walking into work and accidentally tripped on the sidewalk. Pt struck her face. She has an abrasion to her nose, appears that it may be fractured, she has a chipped tooth, and abrasion to knuckles on left hand. Pt denies LOC, no blood thinners, and she is AxOx4.

## 2024-03-01 NOTE — ED Provider Triage Note (Signed)
 Emergency Medicine Provider Triage Evaluation Note  Erin Nichols , a 59 y.o. female  was evaluated in triage.  Pt complains of fall.  Pt tripped on an uneven sidewalk on the way into work.  She fell and hit her face.  She has pain in her nose and did chip a tooth.  No loc.  No thinners.  She also has left hand abrasions.  She thinks her tetanus is UTD.  She does have prior facial fx which were repaired several years ago.  Review of Systems  Positive: Facial pain Negative: loc  Physical Exam  BP 133/85 (BP Location: Left Arm)   Pulse 94   Temp 98.9 F (37.2 C) (Oral)   Resp 18   SpO2 94% Comment: Simultaneous filing. User may not have seen previous data. Gen:   Awake, no distress   Resp:  Normal effort  MSK:   Left hand with abrasions.  No swelling.  Left small finger with deformity, but this is old Other:  Nasal abrasions and contusion + swelling.  Dried blood.  Chip to #8 (no root showing).  Medical Decision Making  Medically screening exam initiated at 9:12 AM.  Appropriate orders placed.  Erin Nichols was informed that the remainder of the evaluation will be completed by another provider, this initial triage assessment does not replace that evaluation, and the importance of remaining in the ED until their evaluation is complete.     Dean Clarity, MD 03/01/24 [phone]

## 2024-03-01 NOTE — ED Provider Notes (Signed)
 " [facility] AT [facility] Provider Note   CSN: [id number] Arrival date & time: 03/01/24  9141     Patient presents with: Erin Nichols   BAILEI BUIST is a 59 y.o. female.   Pt is a 59 yo female with pmhx significant for asthma, hld, htn, gerd, and depression.  She fell on some uneven sidewalk going into work today.  She did fall on her face.  She has pain to her nose and some pain to her left hand.  She does not want anything for pain.  She had some nose bleeding, but that has stopped.  No blood thinners.  No loc.       Prior to Admission medications  Medication Sig Start Date End Date Taking? Authorizing Provider  oxyCODONE -acetaminophen  (PERCOCET/ROXICET) 5-325 MG tablet Take 1 tablet by mouth every 6 (six) hours as needed for severe pain (pain score 7-10). 03/01/24  Yes Dean Clarity, MD  albuterol  (PROAIR  HFA) 108 (90 Base) MCG/ACT inhaler Use  2 inhalations  15 minutes  apart with a spacer every 4 hours as Needed to Rescue Asthma 10/24/23   Billy Knee, FNP  aspirin 81 MG chewable tablet Chew by mouth daily.    [provider]  atorvastatin  (LIPITOR) 20 MG tablet TAKE 1 TABLET BY MOUTH DAILY FOR CHOLESTEROL 06/13/23   Billy Knee, FNP  beclomethasone (QVAR  REDIHALER) 40 MCG/ACT inhaler Inhale 1 puff into the lungs 2 (two) times daily. 10/18/22   Wilkinson, Dana E, NP  buPROPion  (WELLBUTRIN  XL) 300 MG 24 hr tablet TAKE 1 TABLET BY MOUTH EVERY MORNING FOR MOOD OR FOCUS OR CONCENTRATION 09/21/23   Billy Knee, FNP  calcium  citrate (CALCITRATE - DOSED IN MG ELEMENTAL CALCIUM ) 950 MG tablet Take 200 mg of elemental calcium  by mouth daily.    [provider]  citalopram  (CELEXA ) 40 MG tablet Take 1 tablet Daily for Anxiety & Mood 10/30/23   Billy Knee, FNP  conjugated estrogens  (PREMARIN ) vaginal cream Insert 1/2 gram once nightly for 14 days, then 1/2 gram three nights a week 10/18/22   Wilkinson, Dana E, NP  cyclobenzaprine  (FLEXERIL ) 10  MG tablet TAKE ONE-HALF TO ONE TABLET BY MOUTH TWICE DAILY AS NEEDED FOR MUSCLE SPASMS 01/08/24   Billy Knee, FNP  Fexofenadine HCl (ALLEGRA PO) Take by mouth.    [provider]  ibuprofen  (ADVIL ) 600 MG tablet Take 1 tablet (600 mg total) by mouth every 6 (six) hours as needed. 05/18/22   Rising, Asberry, PA-C  Multiple Vitamins-Minerals (MULTIVITAMIN ADULTS PO) Take by mouth daily.    [provider]  phentermine  (ADIPEX-P ) 37.5 MG tablet TAKE ONE-HALF TO ONE TABLET BY MOUTH EVERY MORNING FOR DIETING AND WEIGHTLOSS 01/09/24   Billy Knee, FNP  promethazine -dextromethorphan (PROMETHAZINE -DM) 6.25-15 MG/5ML syrup TAKE FIVE ML BY MOUTH FOUR TIMES A DAY AS NEEDED FOR COUGH 02/09/24   Billy Knee, FNP  topiramate  (TOPAMAX ) 50 MG tablet TAKE 1/2 TO 1 TABLET BY MOUTH TWICE DAILY AT SUPPERTIME AND BEDTIME FOR DIETING AND WEIGHT LOSS 10/30/23   Billy Knee, FNP  vitamin B-12 (CYANOCOBALAMIN ) 100 MCG tablet Take 100 mcg by mouth daily.     [provider]  Vitamin D , Ergocalciferol , (DRISDOL ) 1.25 MG (50000 UNIT) CAPS capsule TAKE 1 CAPSULE BY MOUTH EVERY 7 DAYS 03/15/23   Tonita Fallow, MD    Allergies: Patient has no known allergies.    Review of Systems  HENT:  Positive for facial swelling and nosebleeds.  Nose pain  Skin:  Positive for wound.  All other systems reviewed and are negative.   Updated Vital Signs BP 133/85 (BP Location: Left Arm)   Pulse 94   Temp 98.9 F (37.2 C) (Oral)   Resp 18   SpO2 94% Comment: Simultaneous filing. User may not have seen previous data.  Physical Exam Vitals and nursing note reviewed.  Constitutional:      Appearance: Normal appearance.  HENT:     Head: Normocephalic.     Comments: Swelling, contusion, abrasions to nose; dried blood in both nares.  No septal hematoma.    Right Ear: External ear normal.     Left Ear: External ear normal.     Nose: Nose normal.     Mouth/Throat:     Mouth: Mucous membranes  are moist.     Pharynx: Oropharynx is clear.  Eyes:     Extraocular Movements: Extraocular movements intact.     Conjunctiva/sclera: Conjunctivae normal.     Pupils: Pupils are equal, round, and reactive to light.  Cardiovascular:     Rate and Rhythm: Normal rate and regular rhythm.     Pulses: Normal pulses.     Heart sounds: Normal heart sounds.  Pulmonary:     Effort: Pulmonary effort is normal.     Breath sounds: Normal breath sounds.  Abdominal:     General: Abdomen is flat. Bowel sounds are normal.     Palpations: Abdomen is soft.  Musculoskeletal:     Cervical back: Normal range of motion and neck supple.     Comments: Left finger deformity (chronic)  Skin:    General: Skin is warm.     Capillary Refill: Capillary refill takes less than 2 seconds.     Comments: Abrasions to left hand  Neurological:     General: No focal deficit present.     Mental Status: She is alert and oriented to person, place, and time.  Psychiatric:        Mood and Affect: Mood normal.     (all labs ordered are listed, but only abnormal results are displayed) Labs Reviewed - No data to display  EKG: None  Radiology: CT Maxillofacial Wo Contrast Result Date: 03/01/2024 EXAM: CT OF THE FACE WITHOUT CONTRAST 03/01/2024 09:54:12 AM TECHNIQUE: CT of the face was performed without the administration of intravenous contrast. Multiplanar reformatted images are provided for review. Automated exposure control, iterative reconstruction, and/or weight based adjustment of the mA/kV was utilized to reduce the radiation dose to as low as reasonably achievable. COMPARISON: 02/20/2017 prior comminuted and mildly displaced left nasal bone fracture with medial apex angulation resulting in narrowing of the nasal vestibule additional comminuted and displaced right nasal bone fracture also with subtle medial apex angulation. CLINICAL HISTORY: Facial trauma, blunt. FINDINGS: FACIAL BONES: Prior comminuted and mildly  displaced left nasal bone fracture with medial apex angulation resulting in narrowing of the nasal vestibule. Additional comminuted and displaced right nasal bone fracture also with subtle medial apex angulation. The left nasal bone fracture involves the left nasomaxillary suture. There is leftward deviation of the cartilaginous nasal septum. Postsurgical changes over the anterior walls of the frontal sinuses. No mandibular dislocation. No suspicious bone lesion. ORBITS: Globes are intact. No acute traumatic injury. No inflammatory change. SINUSES AND MASTOIDS: Mild mucosal thickening in the alveolar recesses of the maxillary sinuses. Scattered mild to moderate mucosal thickening in the ethmoid sinuses. No air fluid levels noted. No acute abnormality in the mastoids. SOFT TISSUES:  Soft tissue swelling overlying the bilateral nasal bones, more pronounced on the left. There are locules of subcutaneous gas over the left nasal bone. IMPRESSION: 1. Comminuted and mildly displaced left nasal bone fracture with medial apex angulation resulting in narrowing of the nasal vestibule. Fracture involves the left nasomaxillary suture. 2. Comminuted and displaced right nasal bone fracture with subtle medial apex angulation. 3. Soft tissue swelling overlying the bilateral nasal bones, more pronounced on the left, with locules of subcutaneous gas over the left nasal bone. Electronically signed by: Donnice Mania MD MD 03/01/2024 10:27 AM EST RP Workstation: [id number]   CT Cervical Spine Wo Contrast Result Date: 03/01/2024 EXAM: CT CERVICAL SPINE WITHOUT CONTRAST 03/01/2024 09:54:12 AM TECHNIQUE: CT of the cervical spine was performed without the administration of intravenous contrast. Multiplanar reformatted images are provided for review. Automated exposure control, iterative reconstruction, and/or weight based adjustment of the mA/kV was utilized to reduce the radiation dose to as low as reasonably achievable. COMPARISON: None  available. CLINICAL HISTORY: Polytrauma, blunt. FINDINGS: BONES AND ALIGNMENT: Straightening of the normal cervical lordosis. No evidence of traumatic malalignment. Anterior cervical fusion hardware at C5-C6 is intact. Vertebral body heights are maintained. No evidence of acute fracture. DEGENERATIVE CHANGES: Disc space narrowing most pronounced at C4-C5 and C6-C7. Facet arthrosis and uncovertebral hypertrophy at multiple levels. No high grade osseous spinal canal stenosis. No high grade osseous foraminal stenosis. SOFT TISSUES: No prevertebral soft tissue swelling. IMPRESSION: 1. No evidence of acute traumatic injury. 2. Anterior cervical fusion hardware at C5-C6 is intact. Electronically signed by: Donnice Mania MD MD 03/01/2024 10:21 AM EST RP Workstation: [id number]   CT Head Wo Contrast Result Date: 03/01/2024 EXAM: CT HEAD WITHOUT CONTRAST 03/01/2024 09:54:12 AM TECHNIQUE: CT of the head was performed without the administration of intravenous contrast. Automated exposure control, iterative reconstruction, and/or weight based adjustment of the mA/kV was utilized to reduce the radiation dose to as low as reasonably achievable. COMPARISON: None available. CLINICAL HISTORY: Polytrauma, blunt. FINDINGS: BRAIN AND VENTRICLES: No acute hemorrhage. No evidence of acute infarct. No hydrocephalus. No extra-axial collection. No mass effect or midline shift. ORBITS: No acute abnormality. SINUSES: Postsurgical changes over the frontal sinuses. SOFT TISSUES AND SKULL: Bilateral nasal bone fractures with overlying soft tissue swelling, better evaluated on the CT maxillofacial. IMPRESSION: 1. No acute intracranial abnormality. 2. Bilateral nasal bone fractures with overlying soft tissue swelling, better evaluated on the CT maxillofacial. Electronically signed by: Donnice Mania MD MD 03/01/2024 10:15 AM EST RP Workstation: [id number]   DG Hand Complete Left Result Date: 03/01/2024 CLINICAL DATA:  Fall EXAM: LEFT HAND -  COMPLETE 3+ VIEW COMPARISON:  None Available. FINDINGS: There is no evidence of fracture or dislocation. Fusion of fifth proximal interphalangeal joint is noted which most likely is congenital or degenerative in etiology. Remaining joint spaces are unremarkable. Soft tissues are unremarkable. IMPRESSION: No acute abnormality seen. Electronically Signed   By: Lynwood Landy Raddle M.D.   On: 03/01/2024 09:52     Procedures   Medications Ordered in the ED  morphine  (PF) 2 MG/ML injection 4 mg (4 mg Intramuscular Given 03/01/24 1452)  ondansetron  (ZOFRAN -ODT) disintegrating tablet 4 mg (4 mg Oral Given 03/01/24 1452)  bacitracin  ointment (1 Application Topical Given 03/01/24 1452)                                    Medical Decision Making Amount and/or Complexity of Data  Reviewed Radiology: ordered.  Risk OTC drugs. Prescription drug management.   This patient presents to the ED for concern of fall, this involves an extensive number of treatment options, and is a complaint that carries with it a high risk of complications and morbidity.  The differential diagnosis includes multiple trauma   Co morbidities that complicate the patient evaluation  asthma, hld, htn, gerd, and depression   Additional history obtained:  Additional history obtained from epic chart review External records from outside source obtained and reviewed including EMS report  Imaging Studies ordered:  I ordered imaging studies including ct head, ct face, ct c spine, xray left hand  I independently visualized and interpreted imaging which showed  CT face: 1. Comminuted and mildly displaced left nasal bone fracture with medial apex  angulation resulting in narrowing of the nasal vestibule. Fracture involves the  left nasomaxillary suture.  2. Comminuted and displaced right nasal bone fracture with subtle medial apex  angulation.  3. Soft tissue swelling overlying the bilateral nasal bones, more pronounced on  the  left, with locules of subcutaneous gas over the left nasal bone.  CT c-spine: No evidence of acute traumatic injury.  2. Anterior cervical fusion hardware at C5-C6 is intact.  CT head:  No acute intracranial abnormality.  2. Bilateral nasal bone fractures with overlying soft tissue swelling, better  evaluated on the CT maxillofacial.  Left hand: No acute abnormality seen.  I agree with the radiologist interpretation  Medicines ordered and prescription drug management:  I ordered medication including morphine   for pain  Reevaluation of the patient after these medicines showed that the patient improved I have reviewed the patients home medicines and have made adjustments as needed   Test Considered:  ct  Problem List / ED Course:  Fall with nasal bone fx:  pt initially did not want anything for pain, but she was hurting by the time she came back.  She is stable for d/c.  She is to f/u with ENT.  Return if worse.    Reevaluation:  After the interventions noted above, I reevaluated the patient and found that they have :improved   Social Determinants of Health:  Lives at home   Dispostion:  After consideration of the diagnostic results and the patients response to treatment, I feel that the patent would benefit from discharge with outpatient f/u.       Final diagnoses:  Fall, initial encounter  Closed fracture of nasal bone, initial encounter  Abrasion of left hand, initial encounter    ED Discharge Orders          Ordered    oxyCODONE -acetaminophen  (PERCOCET/ROXICET) 5-325 MG tablet  Every 6 hours PRN        03/01/24 1452               Dean Clarity, MD 03/01/24 1457  "

## 2024-03-06 NOTE — Progress Notes (Signed)
 Otolaryngology Clinic Note  HPI:    Nasal Fracture    Erin Nichols is a 59 y.o. female who presents as a new consult, referred by Dr. Mliss Boyers, for evaluation and treatment of nasal bone fracture sustained on 03/01/2024.  She was seen in the ED at that time following a fall and had CT imaging demonstrating a comminuted and mildly displaced left nasal bone fracture with medial apex angulation and a comminuted and displaced right nasal bone fracture. The fall occurred while walking into work and tripping an uneven sidewalk. Although she experienced some bleeding on the day of the accident, there has been no significant blood drainage from her nose since then. She perceives a shift in her nose to the right since the fall. She wears glasses or corrective lenses without any associated pain.  She has a history of a similar incident in 1988, which required surgical intervention by Dr. Floy. Post-surgery, she reported satisfactory nasal breathing, and she declines any significant decline in her nasal breathing following the most recent accident.  PMH/Meds/All/SocHx/FamHx/ROS:   Medical History[1]  Surgical History[2]  No family history of bleeding disorders, wound healing problems or difficulty with anesthesia.   Social History   Socioeconomic History   Marital status: Married    Spouse name: Not on file   Number of children: Not on file   Years of education: Not on file   Highest education level: Not on file  Occupational History   Not on file  Tobacco Use   Smoking status: Never    Passive exposure: Never   Smokeless tobacco: Never  Substance and Sexual Activity   Alcohol use: Never   Drug use: Never   Sexual activity: Not on file  Other Topics Concern   Not on file  Social History Narrative   Not on file   Social Drivers of Health   Living Situation: Low Risk (10/01/2023)   Received from [facility]   Living Situation    In the last 12 months,  was there a time when you were not able to pay the mortgage or rent on time?: No    In the past 12 months, how many times have you moved where you were living?: 0    At any time in the past 12 months, were you homeless or living in a shelter (including now)?: No  Food Insecurity: No Food Insecurity (10/01/2023)   Received from [facility]   Food vital sign    Within the past 12 months, you worried that your food would run out before you got money to buy more: Never true    Within the past 12 months, the food you bought just didn't last and you didn't have money to get more: Never true  Transportation Needs: No Transportation Needs (10/01/2023)   Received from [facility]    In the past 12 months, has lack of transportation kept you from medical appointments or from getting medications?: No    In the past 12 months, has lack of transportation kept you from meetings, work, or from getting things needed for daily living?: No  Utilities: Not on file  Safety: Not on file  Alcohol Screening: Not At Risk (03/06/2024)   AUDIT-C    Frequency of Alcohol Consumption: Never    Average Number of Drinks: Patient does not drink    Frequency of Binge Drinking: Never  Tobacco Use: Low Risk (03/06/2024)   Patient History    Smoking  Tobacco Use: Never    Smokeless Tobacco Use: Never    Passive Exposure: Never  Depression: Not At Risk (03/06/2024)   PHQ-2    PHQ-2 Score: 0  Social Connections: Moderately Integrated (10/01/2023)   Received from [facility]   Social Connection and Isolation Panel    In a typical week, how many times do you talk on the phone with family, friends, or neighbors?: More than three times a week    How often do you get together with friends or relatives?: More than three times a week    How often do you attend church or religious services?: 1 to 4 times per year    Do you belong to any clubs or organizations such as church groups, unions,  fraternal or athletic groups, or school groups?: No    Attends Engineer, Structural: Not on file    Are you married, widowed, divorced, separated, never married, or living with a partner?: Married  Physicist, Medical Strain: Low Risk (10/01/2023)   Received from [facility]   Overall Financial Resource Strain (CARDIA)    How hard is it for you to pay for the very basics like food, housing, medical care, and heating?: Not hard at all    Current Medications[3]  A complete ROS was performed with pertinent positives/negatives noted in the HPI. The remainder of the ROS are negative.    Physical Exam:    BP 126/83 (BP Location: Right arm, Patient Position: Sitting)   Pulse 87   Temp 97.7 F (36.5 C) (Temporal)   Ht 1.582 m (5' 2.3)   Wt 76.8 kg (169 lb 6.4 oz)   BMI 30.69 kg/m    General: Well developed, well nourished. No acute distress.   Head/Face: Normocephalic. Ecchymosis in various stages of healing across nasal dorsum and in the periorbital region.  No sinus tenderness. Facial nerve intact and equal bilaterally.   Eyes: Pupils are equal, round and reactive to light. Conjunctiva and lids are normal. Normal extraocular mobility.   Nose: Palpable step-off deformity of the left nasal bones, with depressed right nasal bones. No purulent discharge. Septum mildly deviated to the left without any evidence of septal hematoma, no evidence of active epistaxis, 2+ turbinate hypertrophy with normal mucosa.  Respiratory: No stridor or distress.  Extremities: No edema or cyanosis. Warm and well-perfused.  Neurologic: CN II-XII intact. Alert and oriented to self, place and time.  Normal reflexes and motor skills, balance and coordination. Moving all extremities without gross abnormality.  Psychiatric:  No unusual anxiety or evidence of depression. Appropriate affect.    Independent Review of Additional Tests or Records:  Documentation from referring provider reviewed Ct imaging  reviewed CT OF THE FACE WITHOUT CONTRAST  03/01/2024 09:54:12 AM   TECHNIQUE:  CT of the face was performed without the administration of intravenous  contrast. Multiplanar reformatted images are provided for review. Automated  exposure control, iterative reconstruction, and/or weight based adjustment of  the mA/kV was utilized to reduce the radiation dose to as low as reasonably  achievable.   COMPARISON:  02/20/2017 prior comminuted and mildly displaced left nasal bone fracture with  medial apex angulation resulting in narrowing of the nasal vestibule additional  comminuted and displaced right nasal bone fracture also with subtle medial apex  angulation.   CLINICAL HISTORY:  Facial trauma, blunt.   FINDINGS:   FACIAL BONES:  Prior comminuted and mildly displaced left nasal bone fracture with medial apex  angulation resulting in narrowing  of the nasal vestibule. Additional comminuted  and displaced right nasal bone fracture also with subtle medial apex  angulation. The left nasal bone fracture involves the left nasomaxillary  suture. There is leftward deviation of the cartilaginous nasal septum.  Postsurgical changes over the anterior walls of the frontal sinuses. No  mandibular dislocation. No suspicious bone lesion.   ORBITS:  Globes are intact. No acute traumatic injury. No inflammatory change.   SINUSES AND MASTOIDS:  Mild mucosal thickening in the alveolar recesses of the maxillary sinuses.  Scattered mild to moderate mucosal thickening in the ethmoid sinuses. No air  fluid levels noted. No acute abnormality in the mastoids.   SOFT TISSUES:  Soft tissue swelling overlying the bilateral nasal bones, more pronounced on  the left. There are locules of subcutaneous gas over the left nasal bone.   IMPRESSION:  1. Comminuted and mildly displaced left nasal bone fracture with medial apex  angulation resulting in narrowing of the nasal vestibule. Fracture involves the   left nasomaxillary suture.  2. Comminuted and displaced right nasal bone fracture with subtle medial apex  angulation.  3. Soft tissue swelling overlying the bilateral nasal bones, more pronounced on  the left, with locules of subcutaneous gas over the left nasal bone.   Procedures:  None  Impression & Plans:  Erin Nichols is a 59 y.o. female with recent history of fall from standing on 03/01/2024 resulting in bilateral nasal bone fracture.  Examination today demonstrates a palpable step-off deformity with depression of bilateral nasal bones with associated cosmetic deformity.  I recommend proceeding with closed reduction of bilateral nasal bone fractures with external stabilization.  Risks of procedure, recovery and postoperative restrictions were comprehensively discussed with patient and her husband.  All questions were answered.  Due to scheduling constraints, patient's surgery will be performed by my partner, Dr. Luciano tomorrow.  Gerard Jenkins Shope, DO Otolaryngology         [1] Past Medical History: Diagnosis Date   Anxiety state 2012-06-15 18:14:25   Anxiety   Asthma (CMD) 2012-01-26 18:56:53   Asthma   Esophageal reflux 2012-06-15 18:14:25   GERD (Gastroesophageal Reflux Disease)   Osteoporosis 2012-01-26 18:56:53   Osteoporosis   Other depressive disorder 2009-07-10 16:08:58   Depression (Disease)  [2] Past Surgical History: Procedure Laterality Date   TONSILLECTOMY  1970-09-11 00:00:00  [3]  Current Outpatient Medications:    albuterol  HFA (PROVENTIL  HFA;VENTOLIN  HFA;PROAIR  HFA) 90 mcg/actuation inhaler, Inhale 2 puffs every 4 (four) hours as needed., Disp: , Rfl:    aspirin 81 mg tab, Take 1 tablet by mouth daily., Disp: , Rfl:    atorvastatin  (LIPITOR) 20 mg tablet, Take 20 mg by mouth daily. for cholesterol, Disp: , Rfl:    beclomethasone dipropionate  (Qvar  RediHaler) 40 mcg/actuation, Inhale 1 puff 2 (two) times a day., Disp: , Rfl:     buPROPion  (WELLBUTRIN  XL) 300 mg 24 hr tablet, Take 300 mg by mouth every morning., Disp: , Rfl:    calcium  citrate (CALCITRATE) 950 mg (200 mg calcium ) tab, Take by mouth daily., Disp: , Rfl:    citalopram  (CeleXA ) 40 mg tablet, Take 40 mg by mouth daily. for anxiety & mood, Disp: , Rfl:    conjugated estrogens  (PREMARIN ) 0.625 mg/gram vaginal cream, Insert 0.5 g into the vagina nightly., Disp: , Rfl:    cranberry fruit extract (cranberry extract, bulk,) 12:1 powd, Take 1 capsule by mouth as needed., Disp: , Rfl:    cyanocobalamin  (VITAMIN B12) 100 mcg tablet, Take  100 mcg by mouth daily., Disp: , Rfl:    cyclobenzap-irritant cntr irr2 10 mg kit, Take 0.5 mg by mouth 2 (two) times a day as needed., Disp: , Rfl:    ergocalciferol  (VITAMIN D2) 1,250 mcg (50,000 unit) capsule, Take 50,000 Units by mouth every 7 days., Disp: , Rfl:    phentermine  37.5 mg tab, 0.5-1 tablets every morning. for dieting and weightloss, Disp: , Rfl:    topiramate  (TOPAMAX ) 50 mg tablet, Take 25 mg by mouth 2 (two) times a day. for dieting and weight loss, Disp: , Rfl:

## 2024-03-07 ENCOUNTER — Ambulatory Visit (HOSPITAL_COMMUNITY): Admitting: Anesthesiology

## 2024-03-07 ENCOUNTER — Encounter (HOSPITAL_COMMUNITY)
Admission: RE | Disposition: A | Discharge status: Home / Self Care | Payer: Self-pay | Source: Home / Self Care | Attending: Otolaryngology

## 2024-03-07 ENCOUNTER — Encounter (HOSPITAL_COMMUNITY): Payer: Self-pay | Admitting: Otolaryngology

## 2024-03-07 ENCOUNTER — Other Ambulatory Visit: Payer: Self-pay

## 2024-03-07 ENCOUNTER — Ambulatory Visit (HOSPITAL_COMMUNITY)
Admission: RE | Admit: 2024-03-07 | Discharge: 2024-03-07 | Disposition: A | Discharge status: Home / Self Care | Attending: Otolaryngology | Admitting: Otolaryngology

## 2024-03-07 DIAGNOSIS — X58XXXA Exposure to other specified factors, initial encounter: Secondary | ICD-10-CM | POA: Diagnosis not present

## 2024-03-07 DIAGNOSIS — K219 Gastro-esophageal reflux disease without esophagitis: Secondary | ICD-10-CM | POA: Insufficient documentation

## 2024-03-07 DIAGNOSIS — Z6831 Body mass index (BMI) 31.0-31.9, adult: Secondary | ICD-10-CM | POA: Insufficient documentation

## 2024-03-07 DIAGNOSIS — E669 Obesity, unspecified: Secondary | ICD-10-CM | POA: Diagnosis not present

## 2024-03-07 DIAGNOSIS — S022XXA Fracture of nasal bones, initial encounter for closed fracture: Secondary | ICD-10-CM | POA: Insufficient documentation

## 2024-03-07 DIAGNOSIS — J45909 Unspecified asthma, uncomplicated: Secondary | ICD-10-CM | POA: Diagnosis not present

## 2024-03-07 HISTORY — DX: Other complications of anesthesia, initial encounter: T88.59XA

## 2024-03-07 HISTORY — PX: CLOSED REDUCTION NASAL FRACTURE: SHX5365

## 2024-03-07 HISTORY — DX: Nausea with vomiting, unspecified: R11.2

## 2024-03-07 LAB — CBC
HCT: 40 % (ref 36.0–46.0)
Hemoglobin: 13.8 g/dL (ref 12.0–15.0)
MCH: 32 pg (ref 26.0–34.0)
MCHC: 34.5 g/dL (ref 30.0–36.0)
MCV: 92.8 fL (ref 80.0–100.0)
Platelets: 228 K/uL (ref 150–400)
RBC: 4.31 MIL/uL (ref 3.87–5.11)
RDW: 12.9 % (ref 11.5–15.5)
WBC: 6.5 K/uL (ref 4.0–10.5)
nRBC: 0 % (ref 0.0–0.2)

## 2024-03-07 MED ORDER — LIDOCAINE HCL (PF) 2 % IJ SOLN
INTRAMUSCULAR | Status: DC | PRN
  Administered 2024-03-07: 80 mg via INTRADERMAL

## 2024-03-07 MED ORDER — CHLORHEXIDINE GLUCONATE 0.12 % MT SOLN: 15.0000 mL | Freq: Once | OROMUCOSAL | Status: AC

## 2024-03-07 MED ORDER — SODIUM CHLORIDE 0.9 % IV SOLN: 12.5000 mg | INTRAVENOUS | Status: DC | PRN

## 2024-03-07 MED ORDER — OXYCODONE HCL 5 MG PO TABS
5.0000 mg | ORAL_TABLET | Freq: Once | ORAL | Status: AC | PRN
  Administered 2024-03-07: 5 mg via ORAL

## 2024-03-07 MED ORDER — PROPOFOL 10 MG/ML IV BOLUS
INTRAVENOUS | Status: AC
  Filled 2024-03-07: qty 20

## 2024-03-07 MED ORDER — ONDANSETRON HCL 4 MG/2ML IJ SOLN
INTRAMUSCULAR | Status: DC | PRN
  Administered 2024-03-07: 4 mg via INTRAVENOUS

## 2024-03-07 MED ORDER — FENTANYL CITRATE (PF) 100 MCG/2ML IJ SOLN
INTRAMUSCULAR | Status: AC
  Filled 2024-03-07: qty 2

## 2024-03-07 MED ORDER — OXYMETAZOLINE HCL 0.05 % NA SOLN
NASAL | Status: AC
  Filled 2024-03-07: qty 30

## 2024-03-07 MED ORDER — MIDAZOLAM HCL 2 MG/2ML IJ SOLN
INTRAMUSCULAR | Status: AC
  Filled 2024-03-07: qty 2

## 2024-03-07 MED ORDER — FENTANYL CITRATE (PF) 100 MCG/2ML IJ SOLN
INTRAMUSCULAR | Status: DC | PRN
  Administered 2024-03-07 (×2): 50 ug via INTRAVENOUS

## 2024-03-07 MED ORDER — LACTATED RINGERS IV SOLN: INTRAVENOUS | Status: DC

## 2024-03-07 MED ORDER — ACETAMINOPHEN 500 MG PO TABS
1000.0000 mg | ORAL_TABLET | Freq: Once | ORAL | Status: AC
  Administered 2024-03-07: 1000 mg via ORAL
  Filled 2024-03-07: qty 2

## 2024-03-07 MED ORDER — OXYCODONE HCL 5 MG/5ML PO SOLN: 5.0000 mg | Freq: Once | ORAL | Status: AC | PRN

## 2024-03-07 MED ORDER — MIDAZOLAM HCL 5 MG/5ML IJ SOLN
INTRAMUSCULAR | Status: DC | PRN
  Administered 2024-03-07: 2 mg via INTRAVENOUS

## 2024-03-07 MED ORDER — OXYCODONE HCL 5 MG PO TABS
ORAL_TABLET | ORAL | Status: AC
  Filled 2024-03-07: qty 1

## 2024-03-07 MED ORDER — LIDOCAINE-EPINEPHRINE 1 %-1:100000 IJ SOLN
INTRAMUSCULAR | Status: DC | PRN
  Administered 2024-03-07: 2 mL

## 2024-03-07 MED ORDER — EPINEPHRINE HCL (NASAL) 0.1 % NA SOLN
NASAL | Status: AC
  Filled 2024-03-07: qty 60

## 2024-03-07 MED ORDER — AMISULPRIDE (ANTIEMETIC) 5 MG/2ML IV SOLN: 10.0000 mg | Freq: Once | INTRAVENOUS | Status: DC | PRN

## 2024-03-07 MED ORDER — ORAL CARE MOUTH RINSE: 15.0000 mL | Freq: Once | OROMUCOSAL | Status: AC

## 2024-03-07 MED ORDER — CHLORHEXIDINE GLUCONATE 0.12 % MT SOLN
OROMUCOSAL | Status: AC
  Administered 2024-03-07: 15 mL via OROMUCOSAL
  Filled 2024-03-07: qty 15

## 2024-03-07 MED ORDER — PROPOFOL 10 MG/ML IV BOLUS
INTRAVENOUS | Status: DC | PRN
  Administered 2024-03-07: 50 mg via INTRAVENOUS
  Administered 2024-03-07: 150 mg via INTRAVENOUS

## 2024-03-07 MED ORDER — FENTANYL CITRATE (PF) 100 MCG/2ML IJ SOLN
25.0000 ug | INTRAMUSCULAR | Status: DC | PRN
  Administered 2024-03-07 (×3): 50 ug via INTRAVENOUS

## 2024-03-07 MED ORDER — 0.9 % SODIUM CHLORIDE (POUR BTL) OPTIME
TOPICAL | Status: DC | PRN
  Administered 2024-03-07: 1000 mL

## 2024-03-07 MED ORDER — DEXAMETHASONE SOD PHOSPHATE PF 10 MG/ML IJ SOLN
INTRAMUSCULAR | Status: DC | PRN
  Administered 2024-03-07: 10 mg via INTRAVENOUS

## 2024-03-07 MED ORDER — LACTATED RINGERS IV SOLN: INTRAVENOUS | Status: DC | PRN

## 2024-03-07 NOTE — Discharge Instructions (Signed)
 DISCHARGE INSTRUCTIONS AFTER NASAL FRACTURE.  Liberal use of ice packs on the nasal dorsum the next 3 days Use saline sprays to cleanse your nose Avoid vigorous physical exertion for 2 weeks Use Afrin nasal spray as needed for nose bleeds (available OTC).  F/u next week for splint removal  Elspeth Coddington MD [facility] [place] [phone] [place], [place] [address] Phone: [phone]

## 2024-03-07 NOTE — Op Note (Signed)
 OPERATIVE NOTE  Erin Nichols Date/Time of Admission: 03/07/2024  1:39 PM  CSN: [id number];FMW:[id number] Attending Provider: Luciano Standing, MD Room/Bed: MCPO/NONE DOB: 27-Jul-1965 Age: 59 y.o.   Pre-Op Diagnosis: Closed fracture of nasal bone, initial encounter  Post-Op Diagnosis: Closed fracture of nasal bone, initial encounter  Procedure: Closed reduction nasal bone fractures with external splint stabilization CPT 21320  Anesthesia: General  Surgeon(s): Standing [name], MD  Staff: Circulator: Storm Maryelizabeth CROME, RN Scrub Person: Perez-Vasquez, Tiffany Circulator Assistant: Tomas Krabbe, RN  Implants: * No implants in log *  Specimens: * No specimens in log *  Complications: none  EBL: minimal ML  IVF: Per anesthesia ML  Condition: stable  Operative Findings:  Bilateral displaced and hypermobile unstable nasal bone fractures  Description of Operation: The patient identified in the preoperative area and consent confirmed the chart.  She was brought to the operating room by the anesthetist and a preoperative huddle was performed confirming the patient identity and procedure to be performed.  Once all were in agreement we proceeded with surgery.  General anesthesia was induced the patient intubated with LMA. The patient's nose was examined with the findings noted above.  Epinephrine  pledgets were placed in the nasal cavity. Bilateral V2 nerve blocks with 1% lido 1:100k epi were performed.  Next a butter knife nasal bone elevator was placed in the nasal cavity and used to reduce the nasal bones in anatomic alignment with external manual palpation.  The nasal bones were hypermobile and highly unstable. Internal telfa nasal splints were applied bilaterally. After successful alignment was achieved a nasal thermoplastic [place] splint was contoured and fashioned and secured after placement of brown over tape on the nasal dorsum.  Hemostasis was confirmed and  pledgets were removed.  Patient was turned back to the anesthetist extubated and brought to the recovery room in stable condition.    Standing [name], MD [facility]  03/07/2024

## 2024-03-07 NOTE — Anesthesia Preprocedure Evaluation (Addendum)
"                                    Anesthesia Evaluation  Patient identified by MRN, date of birth, ID band Patient awake    Reviewed: Allergy  & Precautions, NPO status , Patient's Chart, lab work & pertinent test results  History of Anesthesia Complications (+) PONV and history of anesthetic complications  Airway Mallampati: II  TM Distance: >3 FB Neck ROM: Full    Dental  (+) Dental Advisory Given, Chipped, Poor Dentition   Pulmonary asthma    Pulmonary exam normal        Cardiovascular hypertension (off meds x 3 yrs), Normal cardiovascular exam     Neuro/Psych  PSYCHIATRIC DISORDERS  Depression     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     GI/Hepatic Neg liver ROS,GERD  Controlled and Medicated,,  Endo/Other   Obesity   Renal/GU CRFRenal disease     Musculoskeletal negative musculoskeletal ROS (+)    Abdominal   Peds  Hematology negative hematology ROS (+)   Anesthesia Other Findings   Reproductive/Obstetrics                              Anesthesia Physical Anesthesia Plan  ASA: 2  Anesthesia Plan: General   Post-op Pain Management: Tylenol  PO (pre-op)*   Induction: Intravenous  PONV Risk Score and Plan: 4 or greater and Treatment may vary due to age or medical condition, Ondansetron  and Dexamethasone   Airway Management Planned: LMA  Additional Equipment: None  Intra-op Plan:   Post-operative Plan: Extubation in OR  Informed Consent: I have reviewed the patients History and Physical, chart, labs and discussed the procedure including the risks, benefits and alternatives for the proposed anesthesia with the patient or authorized representative who has indicated his/her understanding and acceptance.     Dental advisory given  Plan Discussed with: CRNA and Anesthesiologist  Anesthesia Plan Comments:          Anesthesia Quick Evaluation  "

## 2024-03-07 NOTE — H&P (Signed)
 Erin Nichols is an 59 y.o. female.    Chief Complaint:  Nasal bone fracture   HPI: Patient presents today for planned elective procedure.  He/she denies any interval change in history since office visit on 03/06/24.   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    Hyperlipidemia 2012   Hypertension 2012   PONV (postoperative nausea and vomiting)    Vitamin D  deficiency     Past Surgical History:  Procedure Laterality Date   COLONOSCOPY N/A 2015   screening at age 23 - Negative   FRACTURE SURGERY  1988   facial Fx , nose, Ankle both Arms   l ulnar nonunion  1992   NASAL SEPTUM SURGERY  1991   nonunion 5th metatarsal  2008   r sub talar fusion  2008   redo subtalar fusion  2013   rt ankle surg for non union  2013   SPINE SURGERY  2011   Cx5 - Cx6 fusion w/plate/screws   TONSILLECTOMY AND ADENOIDECTOMY  1972    Family History  Problem Relation Age of Onset   Diabetes Mother    Leukemia Mother    CAD Father    Cirrhosis Father    Alcohol abuse Father    Cirrhosis Sister        hep C, alcohol, drug user   Hypertension Brother    Heart disease Brother    Heart failure Brother    Stroke Maternal Grandmother     Social History:  reports that she has never smoked. She has never used smokeless tobacco. She reports that she does not drink alcohol and does not use drugs.  Allergies: Allergies[1]  Medications Prior to Admission  Medication Sig Dispense Refill   atorvastatin  (LIPITOR) 20 MG tablet TAKE 1 TABLET BY MOUTH DAILY FOR CHOLESTEROL 90 tablet 3   buPROPion  (WELLBUTRIN  XL) 300 MG 24 hr tablet TAKE 1 TABLET BY MOUTH EVERY MORNING FOR MOOD OR FOCUS OR CONCENTRATION 90 tablet 3   calcium  citrate (CALCITRATE - DOSED IN MG ELEMENTAL CALCIUM ) 950 MG tablet Take 200 mg of elemental calcium  by mouth daily.      cetirizine (ZYRTEC) 5 MG chewable tablet Chew 5 mg by mouth as needed for allergies.     citalopram  (CELEXA ) 40 MG tablet Take 1 tablet Daily for Anxiety & Mood 90 tablet 3   cyclobenzaprine  (FLEXERIL ) 10 MG tablet TAKE ONE-HALF TO ONE TABLET BY MOUTH TWICE DAILY AS NEEDED FOR MUSCLE SPASMS 90 tablet 1   Fexofenadine HCl (ALLEGRA PO) Take by mouth.     ibuprofen  (ADVIL ) 600 MG tablet Take 1 tablet (600 mg total) by mouth every 6 (six) hours as needed. 30 tablet 0   Multiple Vitamins-Minerals (MULTIVITAMIN ADULTS PO) Take by mouth daily.     oxyCODONE -acetaminophen  (PERCOCET/ROXICET) 5-325 MG tablet Take 1 tablet by mouth every 6 (six) hours as needed for severe pain (pain score 7-10). 15 tablet 0   promethazine -dextromethorphan (PROMETHAZINE -DM) 6.25-15 MG/5ML syrup TAKE FIVE ML BY MOUTH FOUR TIMES A DAY AS NEEDED FOR COUGH 240 mL 1   topiramate  (TOPAMAX ) 50 MG tablet TAKE 1/2 TO 1 TABLET BY MOUTH TWICE DAILY AT SUPPERTIME AND BEDTIME FOR DIETING AND WEIGHT LOSS 180 tablet 3   vitamin B-12 (CYANOCOBALAMIN ) 100 MCG tablet Take 100 mcg by mouth daily.  Vitamin D , Ergocalciferol , (DRISDOL ) 1.25 MG (50000 UNIT) CAPS capsule TAKE 1 CAPSULE BY MOUTH EVERY 7 DAYS 12 capsule 3   albuterol  (PROAIR  HFA) 108 (90 Base) MCG/ACT inhaler Use  2 inhalations  15 minutes  apart with a spacer every 4 hours as Needed to Rescue Asthma 48 g 3   aspirin 81 MG chewable tablet Chew by mouth daily.     beclomethasone (QVAR  REDIHALER) 40 MCG/ACT inhaler Inhale 1 puff into the lungs 2 (two) times daily. 1 each 3   conjugated estrogens  (PREMARIN ) vaginal cream Insert 1/2 gram once nightly for 14 days, then 1/2 gram three nights a week 90 g 12   phentermine  (ADIPEX-P ) 37.5 MG tablet TAKE ONE-HALF TO ONE TABLET BY MOUTH EVERY MORNING FOR DIETING AND WEIGHTLOSS (Patient taking differently: Patient takes HALF of a tablet.) 30 tablet 2    No results found for this or any previous visit (from the past 48 hours). No results  found.  ROS: negative other than stated in HPI  Blood pressure (!) 155/93, pulse 97, temperature 98.9 F (37.2 C), temperature source Oral, resp. rate 17, height 5' 2 (1.575 m), weight 77.1 kg, SpO2 97%.  PHYSICAL EXAM: General: Resting comfortably in NAD  Lungs: Non-labored respiratinos  Studies Reviewed: CT max-face  IMPRESSION: 1. Comminuted and mildly displaced left nasal bone fracture with medial apex angulation resulting in narrowing of the nasal vestibule. Fracture involves the left nasomaxillary suture. 2. Comminuted and displaced right nasal bone fracture with subtle medial apex angulation. 3. Soft tissue swelling overlying the bilateral nasal bones, more pronounced on the left, with locules of subcutaneous gas over the left nasal bone.   Electronically signed by: Donnice Mania MD MD 03/01/2024 10:27 AM EST RP  Assessment/Plan Bilateral nasal bone fractures  Proceed with CRNF. Informed consent obtained.    Electronically signed by:  Elspeth Coddington, MD  Facial Plastic & Reconstructive Surgery Otolaryngology - Head and Neck Surgery [facility]  03/07/2024, 2:25 PM       [1] No Known Allergies

## 2024-03-07 NOTE — Anesthesia Procedure Notes (Signed)
 Procedure Name: LMA Insertion Date/Time: 03/07/2024 3:42 PM  Performed by: Arvell Edsel HERO, CRNAPre-anesthesia Checklist: Patient identified, Emergency Drugs available, Suction available, Patient being monitored and Timeout performed Patient Re-evaluated:Patient Re-evaluated prior to induction Oxygen Delivery Method: Circle system utilized Preoxygenation: Pre-oxygenation with 100% oxygen Induction Type: IV induction Ventilation: Mask ventilation without difficulty LMA: LMA inserted LMA Size: 4.0 Number of attempts: 1 Placement Confirmation: breath sounds checked- equal and bilateral and positive ETCO2 Tube secured with: Tape Dental Injury: Teeth and Oropharynx as per pre-operative assessment

## 2024-03-07 NOTE — Transfer of Care (Signed)
 Immediate Anesthesia Transfer of Care Note  Patient: Erin Nichols  Procedure(s) Performed: CLOSED REDUCTION, FRACTURE, NASAL BONE (Bilateral: Nose)  Patient Location: PACU  Anesthesia Type:General  Level of Consciousness: awake, alert , oriented, and patient cooperative  Airway & Oxygen Therapy: Patient Spontanous Breathing and Patient connected to face mask oxygen  Post-op Assessment: Report given to RN, Post -op Vital signs reviewed and stable, Patient moving all extremities, and Patient moving all extremities X 4  Post vital signs: Reviewed and stable  Last Vitals:  Vitals Value Taken Time  BP 130/76 03/07/24 16:07  Temp    Pulse 97 03/07/24 16:09  Resp 20 03/07/24 16:09  SpO2 100 % 03/07/24 16:09  Vitals shown include unfiled device data.  Last Pain:  Vitals:   03/07/24 1417  TempSrc:   PainSc: 4       Patients Stated Pain Goal: 1 (03/07/24 1417)  Complications: No notable events documented.

## 2024-03-07 NOTE — Anesthesia Postprocedure Evaluation (Signed)
"   Anesthesia Post Note  Patient: Erin Nichols  Procedure(s) Performed: CLOSED REDUCTION, FRACTURE, NASAL BONE (Bilateral: Nose)     Patient location during evaluation: PACU Anesthesia Type: General Level of consciousness: awake and alert Pain management: pain level controlled Vital Signs Assessment: post-procedure vital signs reviewed and stable Respiratory status: spontaneous breathing, nonlabored ventilation, respiratory function stable and patient connected to nasal cannula oxygen Cardiovascular status: blood pressure returned to baseline and stable Postop Assessment: no apparent nausea or vomiting Anesthetic complications: no   No notable events documented.  Last Vitals:  Vitals:   03/07/24 1715 03/07/24 1730  BP: (!) 146/74 (!) 141/85  Pulse: 90 85  Resp: 13 11  Temp:  36.6 C  SpO2: 93% 94%    Last Pain:  Vitals:   03/07/24 1726  TempSrc:   PainSc: 5    Pain Goal: Patients Stated Pain Goal: 1 (03/07/24 1607)                 Olita Takeshita      "

## 2024-03-08 ENCOUNTER — Encounter (HOSPITAL_COMMUNITY): Payer: Self-pay | Admitting: Otolaryngology

## 2024-04-05 ENCOUNTER — Ambulatory Visit: Admitting: Internal Medicine
